# Patient Record
Sex: Male | Born: 1950 | Race: White | Hispanic: No | Marital: Married | Smoking: Never smoker
Health system: Southern US, Community
[De-identification: ages and names within clinical notes are randomized; demographics above are authoritative.]

## PROBLEM LIST (undated history)

## (undated) DIAGNOSIS — K760 Fatty (change of) liver, not elsewhere classified: Secondary | ICD-10-CM

## (undated) DIAGNOSIS — R7989 Other specified abnormal findings of blood chemistry: Secondary | ICD-10-CM

## (undated) DIAGNOSIS — H6120 Impacted cerumen, unspecified ear: Secondary | ICD-10-CM

## (undated) DIAGNOSIS — L309 Dermatitis, unspecified: Secondary | ICD-10-CM

## (undated) DIAGNOSIS — M069 Rheumatoid arthritis, unspecified: Secondary | ICD-10-CM

## (undated) DIAGNOSIS — G4730 Sleep apnea, unspecified: Secondary | ICD-10-CM

## (undated) DIAGNOSIS — R945 Abnormal results of liver function studies: Secondary | ICD-10-CM

## (undated) DIAGNOSIS — I10 Essential (primary) hypertension: Secondary | ICD-10-CM

## (undated) DIAGNOSIS — K219 Gastro-esophageal reflux disease without esophagitis: Secondary | ICD-10-CM

## (undated) DIAGNOSIS — E663 Overweight: Secondary | ICD-10-CM

## (undated) DIAGNOSIS — C439 Malignant melanoma of skin, unspecified: Secondary | ICD-10-CM

## (undated) DIAGNOSIS — K649 Unspecified hemorrhoids: Secondary | ICD-10-CM

## (undated) DIAGNOSIS — K5790 Diverticulosis of intestine, part unspecified, without perforation or abscess without bleeding: Secondary | ICD-10-CM

## (undated) DIAGNOSIS — J302 Other seasonal allergic rhinitis: Secondary | ICD-10-CM

## (undated) DIAGNOSIS — F419 Anxiety disorder, unspecified: Secondary | ICD-10-CM

## (undated) DIAGNOSIS — M1990 Unspecified osteoarthritis, unspecified site: Secondary | ICD-10-CM

## (undated) HISTORY — DX: Sleep apnea, unspecified: G47.30

## (undated) HISTORY — PX: HEMORROIDECTOMY: SUR656

## (undated) HISTORY — DX: Anxiety disorder, unspecified: F41.9

## (undated) HISTORY — DX: Diverticulosis of intestine, part unspecified, without perforation or abscess without bleeding: K57.90

## (undated) HISTORY — DX: Fatty (change of) liver, not elsewhere classified: K76.0

## (undated) HISTORY — DX: Rheumatoid arthritis, unspecified: M06.9

## (undated) HISTORY — DX: Abnormal results of liver function studies: R94.5

## (undated) HISTORY — PX: COLONOSCOPY: SHX174

## (undated) HISTORY — DX: Dermatitis, unspecified: L30.9

## (undated) HISTORY — DX: Other seasonal allergic rhinitis: J30.2

## (undated) HISTORY — DX: Other specified abnormal findings of blood chemistry: R79.89

## (undated) HISTORY — DX: Gastro-esophageal reflux disease without esophagitis: K21.9

## (undated) HISTORY — DX: Unspecified osteoarthritis, unspecified site: M19.90

## (undated) HISTORY — DX: Malignant melanoma of skin, unspecified: C43.9

## (undated) HISTORY — DX: Unspecified hemorrhoids: K64.9

## (undated) HISTORY — DX: Impacted cerumen, unspecified ear: H61.20

## (undated) HISTORY — DX: Overweight: E66.3

---
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
## 2001-08-09 ENCOUNTER — Encounter: Payer: Self-pay | Admitting: Emergency Medicine

## 2001-08-09 ENCOUNTER — Emergency Department (HOSPITAL_COMMUNITY): Admission: EM | Admit: 2001-08-09 | Discharge: 2001-08-09 | Payer: Self-pay | Admitting: Emergency Medicine

## 2004-11-06 ENCOUNTER — Ambulatory Visit: Payer: Self-pay | Admitting: Pulmonary Disease

## 2006-02-20 ENCOUNTER — Ambulatory Visit: Payer: Self-pay | Admitting: Internal Medicine

## 2006-03-04 ENCOUNTER — Ambulatory Visit: Payer: Self-pay | Admitting: Pulmonary Disease

## 2008-03-03 ENCOUNTER — Encounter: Payer: Self-pay | Admitting: Adult Health

## 2008-03-10 ENCOUNTER — Encounter: Payer: Self-pay | Admitting: Pulmonary Disease

## 2009-04-03 ENCOUNTER — Telehealth (INDEPENDENT_AMBULATORY_CARE_PROVIDER_SITE_OTHER): Payer: Self-pay | Admitting: *Deleted

## 2009-04-03 ENCOUNTER — Ambulatory Visit: Payer: Self-pay | Admitting: Pulmonary Disease

## 2009-04-03 DIAGNOSIS — J301 Allergic rhinitis due to pollen: Secondary | ICD-10-CM | POA: Insufficient documentation

## 2009-04-04 DIAGNOSIS — L259 Unspecified contact dermatitis, unspecified cause: Secondary | ICD-10-CM

## 2009-10-11 ENCOUNTER — Encounter: Payer: Self-pay | Admitting: Pulmonary Disease

## 2010-02-07 ENCOUNTER — Encounter: Payer: Self-pay | Admitting: Pulmonary Disease

## 2010-06-07 ENCOUNTER — Encounter: Payer: Self-pay | Admitting: Pulmonary Disease

## 2010-08-07 ENCOUNTER — Ambulatory Visit: Payer: Self-pay | Admitting: Adult Health

## 2010-10-03 ENCOUNTER — Encounter: Payer: Self-pay | Admitting: Pulmonary Disease

## 2010-10-25 ENCOUNTER — Encounter: Payer: Self-pay | Admitting: Pulmonary Disease

## 2010-10-25 ENCOUNTER — Ambulatory Visit: Payer: Self-pay | Admitting: Pulmonary Disease

## 2010-10-26 DIAGNOSIS — H6120 Impacted cerumen, unspecified ear: Secondary | ICD-10-CM

## 2010-10-26 DIAGNOSIS — M069 Rheumatoid arthritis, unspecified: Secondary | ICD-10-CM

## 2010-10-26 DIAGNOSIS — E663 Overweight: Secondary | ICD-10-CM

## 2010-10-26 DIAGNOSIS — F411 Generalized anxiety disorder: Secondary | ICD-10-CM | POA: Insufficient documentation

## 2010-10-27 LAB — CONVERTED CEMR LAB
ALT: 37 units/L (ref 0–53)
AST: 40 units/L — ABNORMAL HIGH (ref 0–37)
Albumin: 4.4 g/dL (ref 3.5–5.2)
Alkaline Phosphatase: 52 units/L (ref 39–117)
BUN: 15 mg/dL (ref 6–23)
Basophils Absolute: 0 10*3/uL (ref 0.0–0.1)
Basophils Relative: 0.4 % (ref 0.0–3.0)
Bilirubin, Direct: 0.1 mg/dL (ref 0.0–0.3)
CO2: 28 meq/L (ref 19–32)
Calcium: 9.1 mg/dL (ref 8.4–10.5)
Chloride: 103 meq/L (ref 96–112)
Cholesterol: 188 mg/dL (ref 0–200)
Creatinine, Ser: 0.9 mg/dL (ref 0.4–1.5)
Eosinophils Absolute: 0.1 10*3/uL (ref 0.0–0.7)
Eosinophils Relative: 1.4 % (ref 0.0–5.0)
GFR calc non Af Amer: 87.4 mL/min (ref >60)
Glucose, Bld: 97 mg/dL (ref 70–99)
HCT: 45.6 % (ref 39.0–52.0)
HDL: 41.8 mg/dL (ref >39.00)
Hemoglobin: 15.9 g/dL (ref 13.0–17.0)
LDL Cholesterol: 117 mg/dL — ABNORMAL HIGH (ref 0–99)
Lymphocytes Relative: 28.2 % (ref 12.0–46.0)
Lymphs Abs: 2.3 10*3/uL (ref 0.7–4.0)
MCHC: 34.8 g/dL (ref 30.0–36.0)
MCV: 90.9 fL (ref 78.0–100.0)
Monocytes Absolute: 0.6 10*3/uL (ref 0.1–1.0)
Monocytes Relative: 8 % (ref 3.0–12.0)
Neutro Abs: 4.9 10*3/uL (ref 1.4–7.7)
Neutrophils Relative %: 62 % (ref 43.0–77.0)
PSA: 0.3 ng/mL (ref 0.10–4.00)
Platelets: 244 10*3/uL (ref 150.0–400.0)
Potassium: 4.3 meq/L (ref 3.5–5.1)
RBC: 5.01 M/uL (ref 4.22–5.81)
RDW: 13.1 % (ref 11.5–14.6)
Sodium: 138 meq/L (ref 135–145)
TSH: 1.46 microintl units/mL (ref 0.35–5.50)
Total Bilirubin: 0.8 mg/dL (ref 0.3–1.2)
Total CHOL/HDL Ratio: 4
Total Protein: 7.6 g/dL (ref 6.0–8.3)
Triglycerides: 144 mg/dL (ref 0.0–149.0)
VLDL: 28.8 mg/dL (ref 0.0–40.0)
WBC: 8 10*3/uL (ref 4.5–10.5)

## 2010-11-16 ENCOUNTER — Telehealth: Payer: Self-pay | Admitting: Pulmonary Disease

## 2010-12-25 NOTE — Letter (Signed)
Summary: [facility]  [facility]   Imported By: Lester Century 11/28/2009 10:26:27  _____________________________________________________________________  External Attachment:    Type:   Image     Comment:   External Document
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
## 2010-12-25 NOTE — Letter (Signed)
Summary: Office Visit [facility]  Office Visit [facility]   Imported By: Lennie Odor 06/26/2010 13:36:19  _____________________________________________________________________  External Attachment:    Type:   Image     Comment:   External Document

## 2010-12-25 NOTE — Letter (Signed)
Summary: [facility]   Imported By: Sherian Rein 02/28/2010 07:54:25  _____________________________________________________________________  External Attachment:    Type:   Image     Comment:   External Document

## 2010-12-25 NOTE — Letter (Signed)
Summary: [facility]   Imported By: Sherian Rein 10/16/2010 10:01:31  _____________________________________________________________________  External Attachment:    Type:   Image     Comment:   External Document

## 2010-12-25 NOTE — Assessment & Plan Note (Signed)
Summary: inbeded ear wax, pain/in [facility] office/apc   Visit Type:  Acute NP visit Primary Provider/Referring Provider:  Alroy Dust, MD  CC:  Pt c/o bilateral ear itching with pain x 1 week reduced hearing in left ear. Pt requesting flu vaccine.  History of Present Illness: 60 year old male with known history of Rheumatoid Arthritis and rhinitis.   Apr 04, 2009--Last seen 4/07. Pt is followed at rheumatolgy for RZ on humira and methotrexate. He reports he does well and comes in as needed. Chart reveiwed, has not been seen for physical, last seen by Dr. Kriste Basque in 2004 for sick visit.  Presents today for rash on right shoulder that is pruritic. Was working out in yard, mowing. wearing t-shirt w/ limb/brush up against him. Now red area w/ some linear streaking down chest and axillla. Mild redness, no drainage or fever. Denies chest pain, dyspnea, orthopnea, hemoptysis, fever, n/v/d, edema, headache.   August 07, 2010--Presents for work in visit. Complains of Pt c/o bilateral ear itching with pain x 1 week reduced hearing in left ear. Pt requesting flu vaccine. Ears feels very stopped up. Decresed hearing overall. no known injury. Denies chest pain, dyspnea, orthopnea, hemoptysis, fever, n/v/d, edema, headache,ear drainage. We discussed that he is overdue for physical. He has labs at rheumatolgy but has agreed to come back for physical  w/ Dr. Kriste Basque. 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  Pt is not fasitng today.       Preventive Screening-Counseling & Management  Alcohol-Tobacco     Smoking Status: never  Medications Prior to Update: 1)  Allegra 180 Mg  Tabs (Fexofenadine Hcl) .Marland Kitchen.. 1 By Mouth Once Daily As Needed Allergies 2)  Rheumatrex 2.5 Mg Tabs (Methotrexate (Anti-Rheumatic)) .... 8 Tablets By Mouth Every 7 Days 3)  [name] 20 Mg/0.62ml Kit (Adalimumab) .... Every 2 Weeks 4)  Folic Acid 400 Mcg Tabs (Folic Acid) .... Take 1 Tablet By Mouth Once A  Day 5)  Prednisone 10 Mg Tabs (Prednisone) .... 4 Tabs For 2 Days, Then 3 Tabs For 2 Days, 2 Tabs For 2 Days, Then 1 Tab For 2 Days, Then Stop  Current Medications (verified): 1)  Allegra-D Allergy & Congestion 180-240 Mg Xr24h-Tab (Fexofenadine-Pseudoephedrine) .... Take 1 Tablet By Mouth Once A Day As Needed 2)  Rheumatrex 2.5 Mg Tabs (Methotrexate (Anti-Rheumatic)) .... 4 Tablets By Mouth Once Weekly 3)  [name] 20 Mg/0.34ml Kit (Adalimumab) .... Every 2 Weeks 4)  Folic Acid 400 Mcg Tabs (Folic Acid) .... Take 1 Tablet By Mouth Once A Day 5)  Vitamin D3 1000 Unit Caps (Cholecalciferol) .... Take 1 Tablet By Mouth Once A Day  Allergies (verified): No Known Drug Allergies  Past History:  Family History: Last updated: 04/03/2009 positive for aortic aneurysm  Social History: Last updated: 04/03/2009 Patient is the son of long-time patient of Dr. 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 ON [name] and MTX.     Review of Systems      See HPI  Vital Signs:  Patient profile:   60 year old male Height:      58 inches Weight:      237 pounds BMI:     49.71 O2 Sat:      97 % on Room air Temp:  98.0 degrees F oral Pulse rate:   83 / minute BP sitting:   130 / 78  (left arm) Cuff size:   large  Vitals Entered By: Zackery Barefoot CMA (August 07, 2010 2:57 PM)  O2 Flow:  Room air CC: Pt c/o bilateral ear itching with pain x 1 week reduced hearing in left ear. Pt requesting flu vaccine Comments Medications reviewed with patient Verified contact number and pharmacy with patient Zackery Barefoot CMA  August 07, 2010 3:03 PM    Physical Exam  Additional Exam:  GEN: A/Ox3; pleasant , NAD HEENT:  [place]/AT, , EACs-bilateral cerumen impaction. , TMs-wnl, NOSE-clear, THROAT-clear NECK:  Supple w/  fair ROM; no JVD; normal carotid impulses w/o bruits; no thyromegaly or nodules palpated; no lymphadenopathy. RESP  Clear to P & A; CARD:  RRR, no m/r/g   GI:   Soft & nt; nml bowel sounds; no organomegaly or masses detected. Musco: Warm bil,  no calf tenderness edema, clubbing, pulses intact Neuro:  a/ox3 intact no focal deficits.     Procedure Bilateral ear wash w/ cerumen extraction.    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  Debrox as needed  Please contact office for sooner follow up if symptoms do not improve or worsen   Orders: Est. Patient Level III (16109)  Problem # 2:  ARTHRITIS, RHEUMATOID (ICD-714.0) cont to follow up .Rheumatology.  will need to return for CPX and fasting labs.  Orders: Est. Patient Level III (60454)  Problem # 3:  MORBID OBESITY (ICD-278.01) Healthy lifestyle discussed.  follow up CPX  Orders: Est. Patient Level III (09811)  Medications Added to Medication List This Visit: 1)  Allegra-d Allergy & Congestion 180-240 Mg Xr24h-tab (Fexofenadine-pseudoephedrine) .... Take 1 tablet by mouth once a day as needed 2)  Rheumatrex 2.5 Mg Tabs (Methotrexate (anti-rheumatic)) .... 4 tablets by mouth once weekly 3)  Vitamin D3 1000 Unit Caps (Cholecalciferol) .... Take 1 tablet by mouth once a day  Complete Medication List: 1)  Allegra-d Allergy & Congestion 180-240 Mg Xr24h-tab (Fexofenadine-pseudoephedrine) .... Take 1 tablet by mouth once a day as needed 2)  Rheumatrex 2.5 Mg Tabs (Methotrexate (anti-rheumatic)) .... 4 tablets by mouth once weekly 3)  [name] 20 Mg/0.40ml Kit (Adalimumab) .... Every 2 weeks 4)  Folic Acid 400 Mcg Tabs (Folic acid) .... Take 1 tablet by mouth once a day 5)  Vitamin D3 1000 Unit Caps (Cholecalciferol) .... Take 1 tablet by mouth once a day  Other Orders: Flu Vaccine 8yrs + (91478) Admin 1st Vaccine (29562)  Patient  Instructions: 1)  May use debrox drops or peroxide prior to shower as needed for ear wax  2)  Very important to follow up with Dr. Kriste Basque for physical w/ fasitng lab 3)  Flu shot today.    Immunizations Administered:  Influenza Vaccine # 1:    Vaccine Type: Fluvax 3+    Site: left deltoid    Mfr: GlaxoSmithKline    Dose: 0.5 ml    Route: IM    Given by: Zackery Barefoot CMA    Exp. Date: 05/25/2011    Lot #: [id number]  Flu Vaccine Consent Questions:    Do you have a history of severe allergic reactions to this vaccine? no    Any prior history of allergic reactions to egg and/or gelatin? no    Do you have a sensitivity to the preservative Thimersol? no    Do  you have a past history of Guillan-Barre Syndrome? no    Do you currently have an acute febrile illness? no    Have you ever had a severe reaction to latex? no    Vaccine information given and explained to patient? yes

## 2010-12-27 NOTE — Progress Notes (Signed)
Summary: sick  Phone Note Call from Patient Call back at [phone]   Caller: Patient Call For: [name] Summary of Call: Pt c/o headache, head congestion, aches in arms, fatigue, watery and itchy eyes, sneezing, runny nose x 2 days. States has been in [name] with mother and may have "picked up" something from there. Pt wanted to inform SN of same due to weak immune system. Pt in with mother in [place] [address] [facility] or can call him at above cell #. [phone]. Initial call taken by: Zackery Barefoot CMA,  November 16, 2010 9:45 AM  Follow-up for Phone Call        per SN---zpak #1  take as directed rest and increase fluids use mucinex for congestion as directed and saline nasal spray.  called and spoke with pt and he is aware---stated that his mom might be d/c from the [name] tomorrow to her home. Randell Loop CMA  November 16, 2010 2:16 PM     New/Updated Medications: ZITHROMAX Z-PAK 250 MG TABS (AZITHROMYCIN) take as directed Prescriptions: ZITHROMAX Z-PAK 250 MG TABS (AZITHROMYCIN) take as directed  #1 x 0   Entered by:   Randell Loop CMA   Authorized by:   Michele Mcalpine MD   Signed by:   Randell Loop CMA on 11/16/2010   Method used:   Electronically to        [facility] [address] (retail)       [address]       [place], [place]  [address]       Ph: [phone] or [phone]       Fax: [phone]   RxID:   [id number]

## 2011-04-04 ENCOUNTER — Telehealth: Payer: Self-pay | Admitting: Pulmonary Disease

## 2011-04-04 NOTE — Telephone Encounter (Signed)
Pt states his liver enzymes were elevated and he wants to know if taking xanax prn could contribute to this. Pls advise.

## 2011-04-04 NOTE — Telephone Encounter (Signed)
Per SN---this is not a side effect of xanax--this med does not affect your liver.--but the methotrexate can affect liver---will need to let Dr. Herma Carson know right away of results of labs.

## 2011-04-04 NOTE — Telephone Encounter (Signed)
Spoke with pt and notified of the above recs per SN.  Pt verbalized understanding and denied any questions.

## 2011-06-11 ENCOUNTER — Other Ambulatory Visit: Payer: Self-pay | Admitting: Pulmonary Disease

## 2011-10-22 ENCOUNTER — Ambulatory Visit (INDEPENDENT_AMBULATORY_CARE_PROVIDER_SITE_OTHER): Payer: BC Managed Care – PPO | Admitting: Adult Health

## 2011-10-22 ENCOUNTER — Encounter: Payer: Self-pay | Admitting: Adult Health

## 2011-10-22 ENCOUNTER — Encounter: Payer: Self-pay | Admitting: Pulmonary Disease

## 2011-10-22 DIAGNOSIS — J069 Acute upper respiratory infection, unspecified: Secondary | ICD-10-CM

## 2011-10-22 MED ORDER — AZITHROMYCIN 250 MG PO TABS: ORAL_TABLET | ORAL | Status: AC

## 2011-10-22 NOTE — Patient Instructions (Signed)
Zpack to have on hold if symptoms worsen with discolored mucus  Mucinex DM Twice daily  As needed  Cough /congestion  Saline  Nasal rinses As needed   Tylenol As needed   Please contact office for sooner follow up if symptoms do not improve or worsen or seek emergency care  follow up Dr. Kriste Basque  In 2 months for physical.

## 2011-10-22 NOTE — Progress Notes (Signed)
  Subjective:    Patient ID: Nathaniel [name], male    DOB: 1951/04/28, 60 y.o.   MRN: [id number]  HPI 60 y/o WF, son of Nathaniel [name], here for a yearly follow up & CPX... 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 prev LMD was Nathaniel [name], & he has a hx of RA on MTX & [name]- followed by Nathaniel [name] every 51mo...   ~ October 25, 2010: overall doing well- concern over side effects of [name] but RA is controlled (Nathaniel [name])... no resp problems x some snoring, but no evid sleep disordered breathing or daytime hypersom- rest well, wakes refreshed, he is overwt & had difficulty w/ wt reduction... no CP, palpit, SOB, edema, etc... denies GI symptoms but he's never had a colonoscopy & we will refer to GI for this... PSA last yr from [facility] screening was OK & PSA today= 0.30...   10/22/2011 Acute OV  Pt complains of  chills, productive cough with green mucus, sinus pressure, crackles in ears x 3 days.  Taking otc cold meds without much help.  Weather changes are making him worse. Started with chills and decreased appetite. Had flu shot 2 months ago.  No chest pain , hemoptysis or edema.     Review of Systems Constitutional:   No  weight loss, night sweats,  Fevers, ++ chills, fatigue, or  lassitude.  HEENT:   No headaches,  Difficulty swallowing,  Tooth/dental problems, or  Sore throat,                No sneezing, itching, ++ ear ache, nasal congestion, post nasal drip,   CV:  No chest pain,  Orthopnea, PND, swelling in lower extremities, anasarca, dizziness, palpitations, syncope.   GI  No heartburn, indigestion, abdominal pain, nausea, vomiting, diarrhea, change in bowel habits, loss of appetite, bloody stools.   Resp: No shortness of breath with exertion or at rest.    No coughing up of blood.    No wheezing.  No chest wall deformity  Skin: no rash or lesions.  GU: no dysuria, change in color of urine, no urgency or frequency.  No flank pain, no hematuria   MS:  No joint pain or swelling.   No decreased range of motion.  No back pain.  Psych:  No change in mood or affect. No depression or anxiety.  No memory loss.         Objective:   Physical Exam  GEN: A/Ox3; pleasant , NAD, obese   HEENT:  [place]/AT,  EACs-clear, TMs-wnl, NOSE-clear drainage , THROAT-clear, no lesions, no postnasal drip or exudate noted.   NECK:  Supple w/ fair ROM; no JVD; normal carotid impulses w/o bruits; no thyromegaly or nodules palpated; no lymphadenopathy.  RESP  Clear  P & A; w/o, wheezes/ rales/ or rhonchi.no accessory muscle use, no dullness to percussion  CARD:  RRR, no m/r/g  , no peripheral edema, pulses intact, no cyanosis or clubbing.  GI:   Soft & nt; nml bowel sounds; no organomegaly or masses detected.  Musco: Warm bil, no deformities or joint swelling noted.   Neuro: alert, no focal deficits noted.    Skin: Warm, no lesions or rashes        Assessment & Plan:

## 2011-10-24 ENCOUNTER — Telehealth: Payer: Self-pay | Admitting: Pulmonary Disease

## 2011-10-24 DIAGNOSIS — J069 Acute upper respiratory infection, unspecified: Secondary | ICD-10-CM | POA: Insufficient documentation

## 2011-10-24 NOTE — Assessment & Plan Note (Signed)
Zpack to have on hold if symptoms worsen with discolored mucus  Mucinex DM Twice daily  As needed  Cough /congestion  Saline  Nasal rinses As needed   Tylenol As needed   Please contact office for sooner follow up if symptoms do not improve or worsen or seek emergency care  follow up Dr. Nadel  In 2 months for physical.   

## 2011-10-25 NOTE — Telephone Encounter (Signed)
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  LMOM TCB x1 for pt - just need dates to put on note.  Told pt to ask for me by name.

## 2011-10-30 NOTE — Telephone Encounter (Signed)
Spoke with the patient and he says that this was for last week but everything has been worked out on the job and he no longer needs the work note. Katheren Shams aware.

## 2012-08-20 ENCOUNTER — Encounter: Payer: Self-pay | Admitting: Adult Health

## 2012-08-20 ENCOUNTER — Ambulatory Visit (INDEPENDENT_AMBULATORY_CARE_PROVIDER_SITE_OTHER): Payer: BC Managed Care – PPO | Admitting: Adult Health

## 2012-08-20 VITALS — BP 150/86 | HR 75 | Temp 96.9°F | Ht 66.0 in | Wt 239.8 lb

## 2012-08-20 DIAGNOSIS — G4700 Insomnia, unspecified: Secondary | ICD-10-CM

## 2012-08-20 DIAGNOSIS — H6120 Impacted cerumen, unspecified ear: Secondary | ICD-10-CM

## 2012-08-20 MED ORDER — ZOLPIDEM TARTRATE 5 MG PO TABS: 5.0000 mg | ORAL_TABLET | Freq: Every evening | ORAL | Status: DC | PRN

## 2012-08-20 NOTE — Patient Instructions (Addendum)
Return for physical with Dr. Kriste Basque  In 4 weeks with fasting labs -,make sure you keep this  Low salt diet  Healthy sleep habits  May try Ambien 5mg  1/2-1 At bedtime  As needed  Insomnia  Debrox As needed  Ear wax.

## 2012-08-21 DIAGNOSIS — G4700 Insomnia, unspecified: Secondary | ICD-10-CM | POA: Insufficient documentation

## 2012-08-21 NOTE — Assessment & Plan Note (Signed)
Ear irrigation without difficulty.  Debrox As needed

## 2012-08-21 NOTE — Progress Notes (Signed)
  Subjective:    Patient ID: Nathaniel [name], male    DOB: [birth year], 61 y.o.   MRN: [id number]  HPI  78  y/o WF, son of Nathaniel [name], here for a yearly follow up & CPX... 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 prev LMD was Nathaniel [name], & he has a hx of RA on MTX & [name]- followed by Nathaniel [name] every 47mo...    08/20/2012 Acute OV  Complains of  build up of wax in ear. Decreased hearing.  Complains that he has trouble sleeping. Worries a lot.  Wants something to get good night sleep.  We discussed he need to return for CPX w/ fasting labs.    Review of Systems  Constitutional:   No  weight loss, night sweats,  Fevers,chills, fatigue, or  lassitude.  HEENT:   No headaches,  Difficulty swallowing,  Tooth/dental problems, or  Sore throat,                No sneezing, itching,ear ache, nasal congestion, post nasal drip,   CV:  No chest pain,  Orthopnea, PND, swelling in lower extremities, anasarca, dizziness, palpitations, syncope.   GI  No heartburn, indigestion, abdominal pain, nausea, vomiting, diarrhea, change in bowel habits, loss of appetite, bloody stools.   Resp: No shortness of breath with exertion or at rest.    No coughing up of blood.    No wheezing.  No chest wall deformity  Skin: no rash or lesions.  GU: no dysuria, change in color of urine, no urgency or frequency.  No flank pain, no hematuria   MS:  No joint pain or swelling.  No decreased range of motion.  No back pain.  Psych:  No change in mood or affect.   No memory loss.         Objective:   Physical Exam   GEN: A/Ox3; pleasant , NAD, obese   HEENT:  [place]/AT,  EACs-bilateral cerumen impaction , TMs-wnl, NOSE-clear drainage , THROAT-clear, no lesions, no postnasal drip or exudate noted.   NECK:  Supple w/ fair ROM; no JVD; normal carotid impulses w/o bruits; no thyromegaly or nodules palpated; no lymphadenopathy.  RESP  Clear  P & A; w/o, wheezes/ rales/ or rhonchi.no accessory muscle use,  no dullness to percussion  CARD:  RRR, no m/r/g  , no peripheral edema, pulses intact, no cyanosis or clubbing.  GI:   Soft & nt; nml bowel sounds; no organomegaly or masses detected.  Musco: Warm bil, no deformities or joint swelling noted.   Neuro: alert, no focal deficits noted.    Skin: Warm, no lesions or rashes        Assessment & Plan:

## 2012-08-21 NOTE — Assessment & Plan Note (Signed)
Healthy Sleep hygiene discussed  May try Remus Loffler As needed

## 2012-10-16 ENCOUNTER — Ambulatory Visit: Payer: BC Managed Care – PPO | Admitting: Pulmonary Disease

## 2012-10-19 ENCOUNTER — Other Ambulatory Visit: Payer: Self-pay | Admitting: Internal Medicine

## 2012-10-30 ENCOUNTER — Other Ambulatory Visit: Payer: BC Managed Care – PPO

## 2012-12-04 ENCOUNTER — Other Ambulatory Visit: Payer: BC Managed Care – PPO

## 2012-12-18 ENCOUNTER — Ambulatory Visit: Payer: BC Managed Care – PPO | Admitting: Pulmonary Disease

## 2013-01-21 ENCOUNTER — Other Ambulatory Visit (INDEPENDENT_AMBULATORY_CARE_PROVIDER_SITE_OTHER): Payer: BC Managed Care – PPO

## 2013-01-21 ENCOUNTER — Encounter: Payer: Self-pay | Admitting: Pulmonary Disease

## 2013-01-21 ENCOUNTER — Ambulatory Visit (INDEPENDENT_AMBULATORY_CARE_PROVIDER_SITE_OTHER): Payer: BC Managed Care – PPO | Admitting: Pulmonary Disease

## 2013-01-21 VITALS — BP 142/84 | HR 72 | Temp 97.2°F | Ht 66.0 in | Wt 237.6 lb

## 2013-01-21 DIAGNOSIS — Z0000 Encounter for general adult medical examination without abnormal findings: Secondary | ICD-10-CM

## 2013-01-21 DIAGNOSIS — M069 Rheumatoid arthritis, unspecified: Secondary | ICD-10-CM

## 2013-01-21 DIAGNOSIS — E663 Overweight: Secondary | ICD-10-CM

## 2013-01-21 DIAGNOSIS — G4700 Insomnia, unspecified: Secondary | ICD-10-CM

## 2013-01-21 DIAGNOSIS — R945 Abnormal results of liver function studies: Secondary | ICD-10-CM

## 2013-01-21 DIAGNOSIS — F411 Generalized anxiety disorder: Secondary | ICD-10-CM

## 2013-01-21 DIAGNOSIS — R7989 Other specified abnormal findings of blood chemistry: Secondary | ICD-10-CM

## 2013-01-21 LAB — CBC WITH DIFFERENTIAL/PLATELET
Eosinophils Absolute: 0.1 10*3/uL (ref 0.0–0.7)
HCT: 46.2 % (ref 39.0–52.0)
Lymphs Abs: 2.2 10*3/uL (ref 0.7–4.0)
MCHC: 34.9 g/dL (ref 30.0–36.0)
MCV: 85.8 fl (ref 78.0–100.0)
Monocytes Absolute: 0.9 10*3/uL (ref 0.1–1.0)
Neutrophils Relative %: 64.2 % (ref 43.0–77.0)
Platelets: 233 10*3/uL (ref 150.0–400.0)
RDW: 12.8 % (ref 11.5–14.6)

## 2013-01-21 LAB — BASIC METABOLIC PANEL
BUN: 17 mg/dL (ref 6–23)
Chloride: 103 mEq/L (ref 96–112)
Potassium: 4.3 mEq/L (ref 3.5–5.1)
Sodium: 138 mEq/L (ref 135–145)

## 2013-01-21 LAB — LIPID PANEL
Cholesterol: 176 mg/dL (ref 0–200)
LDL Cholesterol: 106 mg/dL — ABNORMAL HIGH (ref 0–99)
Total CHOL/HDL Ratio: 4

## 2013-01-21 LAB — TSH: TSH: 1.67 u[IU]/mL (ref 0.35–5.50)

## 2013-01-21 LAB — HEPATIC FUNCTION PANEL
ALT: 45 U/L (ref 0–53)
AST: 38 U/L — ABNORMAL HIGH (ref 0–37)
Alkaline Phosphatase: 54 U/L (ref 39–117)
Bilirubin, Direct: 0.2 mg/dL (ref 0.0–0.3)
Total Bilirubin: 1.2 mg/dL (ref 0.3–1.2)

## 2013-01-21 LAB — PSA: PSA: 0.48 ng/mL (ref 0.10–4.00)

## 2013-01-21 MED ORDER — CLORAZEPATE DIPOTASSIUM 7.5 MG PO TABS: ORAL_TABLET | ORAL | Status: DC

## 2013-01-21 NOTE — Patient Instructions (Addendum)
Today we updated your med list in our EPIC system...    Continue your current medications the same...  We wrote a new medication for your nerves and sleep>    Try the Chlorazepate 7.5mg  1/2 to 1 tab up to twice daily as needed...  Today we did your FASTING blood work...    We will contact you w/ the results when avail...  We found out that NO ONE does in house abd ultrasound tests...    We will set up an out pt sonar & try to ascertain your coverage/ cost...  Let me know if we need to sched a sleep study for you...    In the meanwhile the biggest factor is your weight!!!       Count calories, eat less, exercise, burn more- get the weight down...  Please let us know if we can be of service in any way.Marland KitchenMarland Kitchen

## 2013-01-22 ENCOUNTER — Telehealth: Payer: Self-pay | Admitting: Pulmonary Disease

## 2013-01-22 MED ORDER — OMEPRAZOLE 20 MG PO CPDR: 20.0000 mg | DELAYED_RELEASE_CAPSULE | Freq: Every day | ORAL | Status: DC

## 2013-01-22 NOTE — Telephone Encounter (Signed)
Refill of the  prilosec has been sent in to the pts pharmacy per pts request. Nothing further is needed.

## 2013-01-23 ENCOUNTER — Encounter: Payer: Self-pay | Admitting: Pulmonary Disease

## 2013-01-23 NOTE — Progress Notes (Signed)
Subjective:     Patient ID: Nathaniel [name], male   DOB: 05-Dec-1950, 62 y.o.   MRN: [id number]  HPI 62 y/o WF, son of Wendel Homeyer, here for a follow up visit & CPX... 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 prev LMD was DrKalish, & he has a hx of RA on MTX & [name]- followed by Eugenie Norrie every 50mo...  ~  October 25, 2010:  overall doing well- concern over side effects of [name] but RA is controlled (DrZ)... no resp problems x some snoring, but no evid sleep disordered breathing or daytime hypersom- rest well, wakes refreshed, he is overwt & had difficulty w/ wt reduction... no CP, palpit, SOB, edema, etc... denies GI symptoms but he's never had a colonoscopy & we will refer to GI for this... PSA last yr from [facility] screening was OK & PSA today= 0.30...  ~  January 21, 2013:  47month ROV & CPX> Jovi continues his regular f/u w/ DrBeekman for Rheum- on [name] & MTX; we do not have any recent notes from Rheum & we have called for these but so far we have not received any records; He indicates that he gets lab work every 50mo as well & he is concerned about a report of elev LFTs and DrBeekman requested an Abd Ultrasound- pending (pt is concerned about his insurance coverage)...  We reviewed the following medical problems during today's office visit >>     AR> he uses OTC antihis as needed...    Snoring> Adal mentioned snoring as a problem (denies daytime hypersomnolence) & we discussed the work-up & offered sleep study when he is ready (he doesn't want it now because he thinks the CPAP will scare his cat)...    Overweight> his weight remains similar to several yrs ago- weight = 238# & we reviewed diet, exercise, wt reduction strategies...    GERD> he has not had GI eval but notes occas epis of heartburn related to food intake but no abd pain, dysphagia, n/v, etc...    Need for colonoscopy> he has never had a screening colonoscopy & is advised to set up this important screening  procedure...    Elev LFTs> he is concerned over recent report of elev LFTs from [facility]; they have ordered an AbdUltrasound but pt is reluctant due to cost; repeat labs here were WNL x SGOT=38 (nl 0-37) and he is reassured; he denies abd pain, n/v, c/d, blood seen; ultrasounds are no longer done "in house" by our GI division & we will sched it to be done out pt...    RA> he remains under the care of DrBeekman for Rheum- on [name] & MTX, +Folate & VitD, we have requested recent notes to review...    Anxiety> he was prev on Alpraz0.5 but he states it gave him HAs; requests diff anxiolytic Rx & we chose Chlorazepate 7.5mg  prn...    Skin lesions> he has mult SKs & is refewred to Derm to consider excision as necessary... We reviewed prob list, meds, xrays and labs> see below for updates >>  LABS 2/14:  FLP- at goals on diet alone x LDL=106;  Chem- wnl x SGOT=38;  CBC- wnl;  TSH=1.67;  PSA=0.48 Abd Sonar => pending         Problem List:   ALLERGIC RHINITIS, SEASONAL (ICD-477.0) - he uses OTC antihist Prn...   SNORING >> Gottfried mentioned snoring as a problem 2/14 (denies daytime hypersomnolence) & we discussed the work-up &  offered sleep study when he is ready (he doesn't want it now because he thinks the CPAP will scare his cat)...  OVERWEIGHT (ICD-278.02) - we discussed diet + exercise program suffic to lose weight. ~  Weight 11/11 = 231#,  5\' 8"  tall,  BMI= 35 ~  Weight 2/14 = 238#  GERD >> he has not had GI eval but notes occas epis of heartburn related to food intake but no abd pain, dysphagia, n/v, etc...  Need for colonoscopy >> he has never had a screening colonoscopy & is advised to set up this important screening procedure...  Elev LFTs >> he is concerned over recent report of elev LFTs from [facility]; they have ordered an AbdUltrasound but pt is reluctant due to cost; repeat labs here were WNL x SGOT=38 (nl 0-37) and he is reassured; he denies abd pain, n/v, c/d, blood seen; ultrasounds  are no longer done "in house" by our GI division & we will sched it to be done out pt...  RHEUMATOID ARTHRITIS (ICD-714.0) - followed by DrZieminski every 66mo on METHOTREXATE 2.5mg - taking 3tabs Qwk, and [name] every other week... he is doing extremely well- asymptomatic & no stigmata of RA.. ~  2/14: he remains under the care of DrBeekman for Rheum- on [name] & MTX, +Folate & VitD, we have requested recent notes to review...  ANXIETY (ICD-300.00) - he has some mild anxiety related to his work & RA diagnosis & Rx... we discussed trial ALPRAZOLAM 0.5mg - 1/2 to 1 tab Tid Prn... ~  2/14:  he was prev on Alpraz0.5 but he states it gave him HAs; requests diff anxiolytic Rx & we chose Chlorazepate 7.5mg  prn...  Health Maintenance: ~  GI:  no signif GI hx & he has never had a colonoscopy> we will refer to GI for this important screening procedure... ~  GU:  he had DRE (neg) and PSA (0.23) in 2010 at [facility]... 2011 CPX here w/ neg DRE & PSA=0.30...  ~  Immunizations:  he will check w/ DrZ since he is on the immunosuppressives=> he had Pneumovax 9/10, TDAP 10/13, and 2013 Flu vaccine 9/13...   Past Surgical History  Procedure Laterality Date  . Hemorroidectomy      Dr. Orson Slick    Outpatient Encounter Prescriptions as of 01/21/2013  Medication Sig Dispense Refill  . adalimumab ([name] PEN) 40 MG/0.8ML injection Inject 40 mg into the skin every other day.       . cholecalciferol (VITAMIN D) 1000 UNITS tablet Take 1,000 Units by mouth daily.        . folic acid (FOLVITE) 1 MG tablet Take 1 mg by mouth daily.        . methotrexate (RHEUMATREX) 2.5 MG tablet 2  tabs by mouth once weekly      . clorazepate (TRANXENE) 7.5 MG tablet Take 1/2 to 1 tablet by mouth up to twice daily as needed  60 tablet  5  . [DISCONTINUED] ALPRAZolam (XANAX) 0.5 MG tablet TAKE 1/2 TO 1 TABLET BY MOUTH UP TO TWICE DAILY AS NEEDED FOR ANXIETY  90 tablet  5  . [DISCONTINUED] cetirizine (ZYRTEC) 10 MG tablet Take 10 mg by  mouth daily as needed.        . [DISCONTINUED] fexofenadine-pseudoephedrine (ALLEGRA-D 24) 180-240 MG per 24 hr tablet Take 1 tablet by mouth daily as needed.        . [DISCONTINUED] sodium chloride (OCEAN) 0.65 % nasal spray Place 1 spray into the nose as needed.        . [  DISCONTINUED] zolpidem (AMBIEN) 5 MG tablet Take 1 tablet (5 mg total) by mouth at bedtime as needed for sleep.  30 tablet  0   No facility-administered encounter medications on file as of 01/21/2013.    No Known Allergies   Current Medications, Allergies, Past Medical History, Past Surgical History, Family History, and Social History were reviewed in [employer] record.   Review of Systems         The patient complains of dyspnea on exertion, arthritis, anxiety, and hay fever.  The patient denies fever, chills, sweats, anorexia, fatigue, weakness, malaise, weight loss, sleep disorder, blurring, diplopia, eye irritation, eye discharge, vision loss, eye pain, photophobia, earache, ear discharge, tinnitus, decreased hearing, nasal congestion, nosebleeds, sore throat, hoarseness, chest pain, palpitations, syncope, orthopnea, PND, peripheral edema, cough, dyspnea at rest, excessive sputum, hemoptysis, wheezing, pleurisy, nausea, vomiting, diarrhea, constipation, change in bowel habits, abdominal pain, melena, hematochezia, jaundice, gas/bloating, indigestion/heartburn, dysphagia, odynophagia, dysuria, hematuria, urinary frequency, urinary hesitancy, nocturia, incontinence, back pain, joint pain, joint swelling, muscle cramps, muscle weakness, stiffness, sciatica, restless legs, leg pain at night, leg pain with exertion, rash, itching, dryness, suspicious lesions, paralysis, paresthesias, seizures, tremors, vertigo, transient blindness, frequent falls, frequent headaches, difficulty walking, depression, memory loss, confusion, cold intolerance, heat intolerance, polydipsia, polyphagia, polyuria, unusual weight  change, abnormal bruising, bleeding, enlarged lymph nodes, urticaria, allergic rash, and recurrent infections.     Objective:   Physical Exam      WD, overweight, 62 y/o WM in NAD... GENERAL:  Alert & oriented; pleasant & cooperative... HEENT:  [place]/AT, EOM-wnl, PERRLA, Fundi-benign, EACs-clear, TMs-wnl, NOSE-clear, THROAT-clear & wnl. NECK:  Supple w/ full ROM; no JVD; normal carotid impulses w/o bruits; no thyromegaly or nodules palpated; no lymphadenopathy. CHEST:  Clear to P & A; without wheezes/ rales/ or rhonchi. HEART:  Regular Rhythm; without murmurs/ rubs/ or gallops. ABDOMEN:  Soft & nontender; normal bowel sounds; no organomegaly or masses detected. RECTAL:  Neg - prostate 2+ & nontender w/o nodules; stool hematest neg. EXT: without deformities or arthritic changes; no active synovitis; no varicose veins/ venous insuffic/ or edema. NEURO:  CN's intact; motor testing normal; sensory testing normal; gait normal & balance OK. DERM:  Mult SKs noted...  RADIOLOGY DATA:  Reviewed in the EPIC EMR & discussed w/ the patient...  LABORATORY DATA:  Reviewed in the EPIC EMR & discussed w/ the patient...   Assessment:     AR/ Snoring>  Offered sleep study but he declines at this time & will let me know when he is ready to proceed...  Overweight>  We reviewed diet, exercise, wt reduction strategies...  GI- GERD, needs colonoscopy, elev SGOT>  We reviewed Prilosec vs Pepcid OTC for reflux symptoms; he needs to sched screening colon; he is reassured about the min elev of SGOTR but he is concerned & we will order Sonar w/ copy to drBeekman...  RA>  He remains on [name] & low dose M[facility]...  Anxiety>  We will try Chlorazepate 7.5mg  1/2 to 1 tab po Tid prn...  Skin Lesions>  Mult SKs could be removed by Derm at his request, he will call Dr?Hall...     Plan:     Patient's Medications  New Prescriptions   CLORAZEPATE (TRANXENE) 7.5 MG TABLET    Take 1/2 to 1 tablet by mouth  up to twice daily as needed   OMEPRAZOLE (PRILOSEC) 20 MG CAPSULE    Take 1 capsule (20 mg total) by mouth daily.  Previous  Medications   ADALIMUMAB ([name] PEN) 40 MG/0.8ML INJECTION    Inject 40 mg into the skin every other day.    CHOLECALCIFEROL (VITAMIN D) 1000 UNITS TABLET    Take 1,000 Units by mouth daily.     FOLIC ACID (FOLVITE) 1 MG TABLET    Take 1 mg by mouth daily.     METHOTREXATE (RHEUMATREX) 2.5 MG TABLET    2  tabs by mouth once weekly  Modified Medications   No medications on file  Discontinued Medications   ALPRAZOLAM (XANAX) 0.5 MG TABLET    TAKE 1/2 TO 1 TABLET BY MOUTH UP TO TWICE DAILY AS NEEDED FOR ANXIETY   CETIRIZINE (ZYRTEC) 10 MG TABLET    Take 10 mg by mouth daily as needed.     FEXOFENADINE-PSEUDOEPHEDRINE (ALLEGRA-D 24) 180-240 MG PER 24 HR TABLET    Take 1 tablet by mouth daily as needed.     SODIUM CHLORIDE (OCEAN) 0.65 % NASAL SPRAY    Place 1 spray into the nose as needed.     ZOLPIDEM (AMBIEN) 5 MG TABLET    Take 1 tablet (5 mg total) by mouth at bedtime as needed for sleep.

## 2013-01-25 ENCOUNTER — Ambulatory Visit (HOSPITAL_COMMUNITY)
Admission: RE | Admit: 2013-01-25 | Discharge: 2013-01-25 | Disposition: A | Discharge status: Home / Self Care | Payer: BC Managed Care – PPO | Source: Ambulatory Visit | Attending: Pulmonary Disease | Admitting: Pulmonary Disease

## 2013-01-25 DIAGNOSIS — K7689 Other specified diseases of liver: Secondary | ICD-10-CM | POA: Insufficient documentation

## 2013-01-25 DIAGNOSIS — R7989 Other specified abnormal findings of blood chemistry: Secondary | ICD-10-CM | POA: Insufficient documentation

## 2013-01-25 DIAGNOSIS — R945 Abnormal results of liver function studies: Secondary | ICD-10-CM

## 2013-01-27 ENCOUNTER — Encounter: Payer: Self-pay | Admitting: Adult Health

## 2013-01-27 NOTE — Telephone Encounter (Signed)
3.3.14 [name] results not yet reviewed Lab results available: Result Notes    Notes Recorded by Michele Mcalpine, MD on 01/22/2013 at 2:19 PM Please notify patient>  FLP looks good on diet alone; LDL=106 7 goal is <100 therefore rec better diet, get wt down! Chems- wnl, LFTs are normal x SGOT=38 & normal is 0-37 (in my opinion this is NOT significant)... CBC, Thyroid, PSA> all WNL.Marland KitchenMarland Kitchen I Rec> proceed w/ Abd Sonar for completeness in the eval...    Lab results forwarded to pt  Advised him [name] results not yet available Will forward to SN

## 2013-01-28 ENCOUNTER — Encounter: Payer: Self-pay | Admitting: Pulmonary Disease

## 2013-07-25 ENCOUNTER — Other Ambulatory Visit: Payer: Self-pay | Admitting: Pulmonary Disease

## 2013-07-26 IMAGING — US US ABDOMEN COMPLETE
1 series · 13 of 25 positions shown · non-contrast
Comparison: None.

CLINICAL DATA: Abnormal liver function studies.

COMPLETE ABDOMINAL ULTRASOUND

[Series 1: us abdomen complete · 0.32mm/px · 13 of 86 slices shown]
[im 1/86]
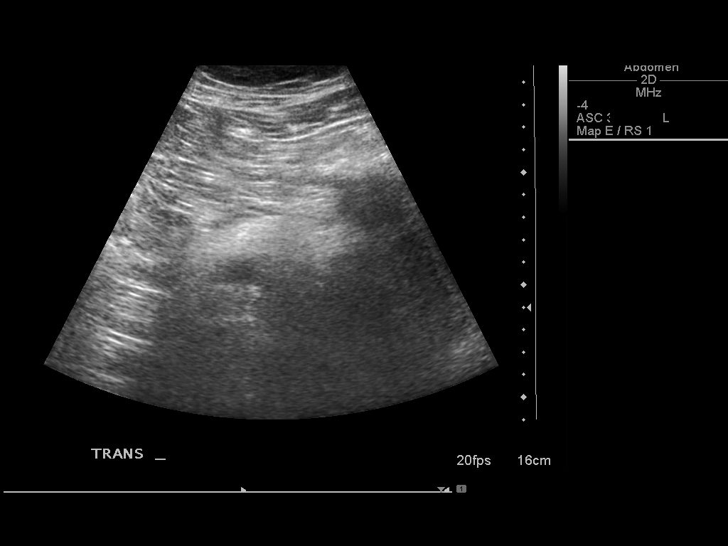
[im 8/86]
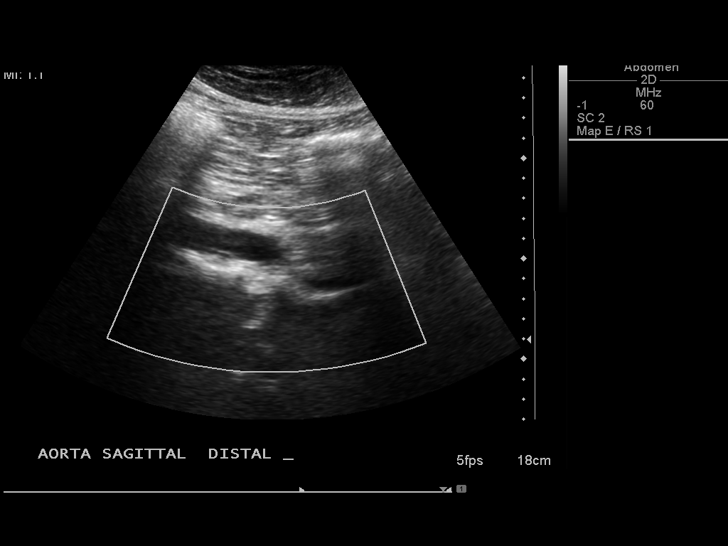
[im 15/86]
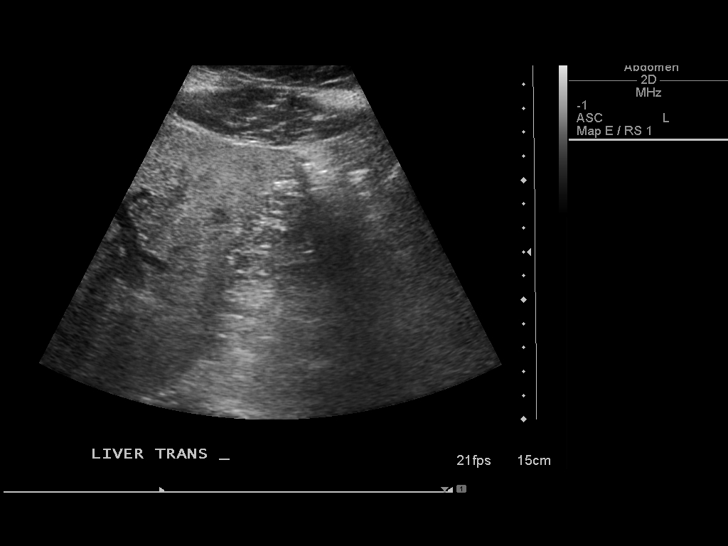
[im 22/86]
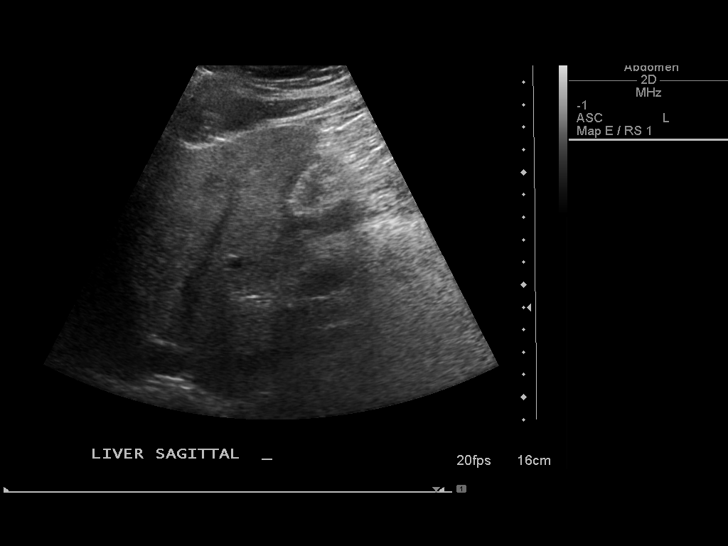
[im 29/86]
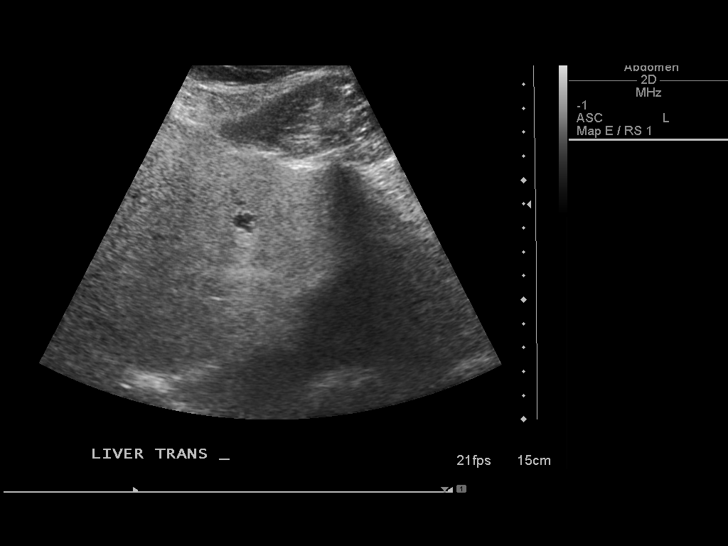
[im 36/86]
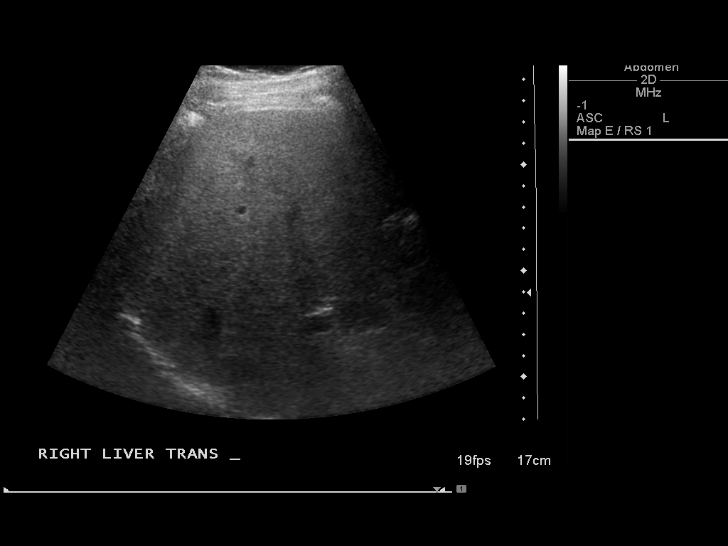
[im 43/86]
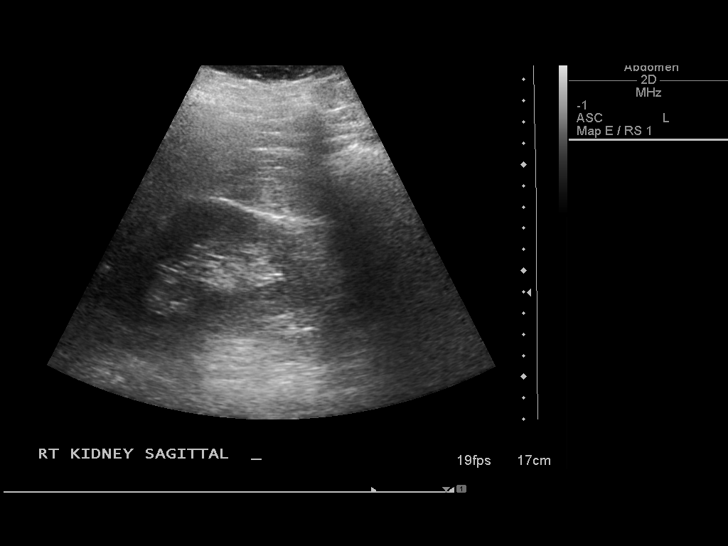
[im 50/86]
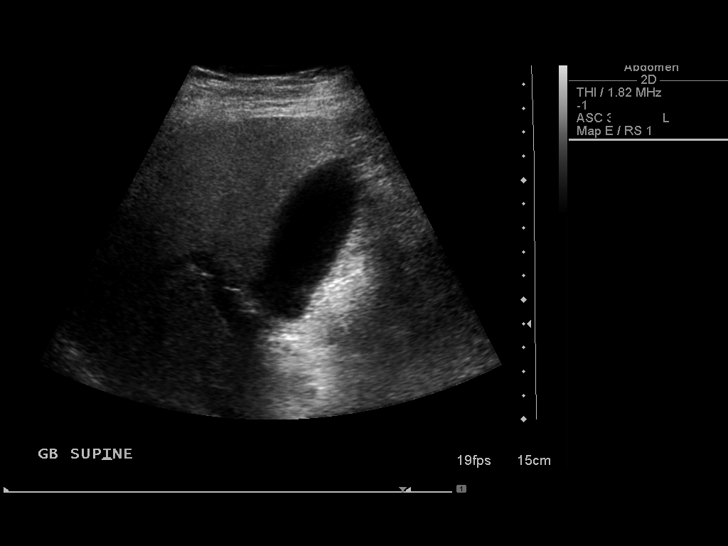
[im 57/86]
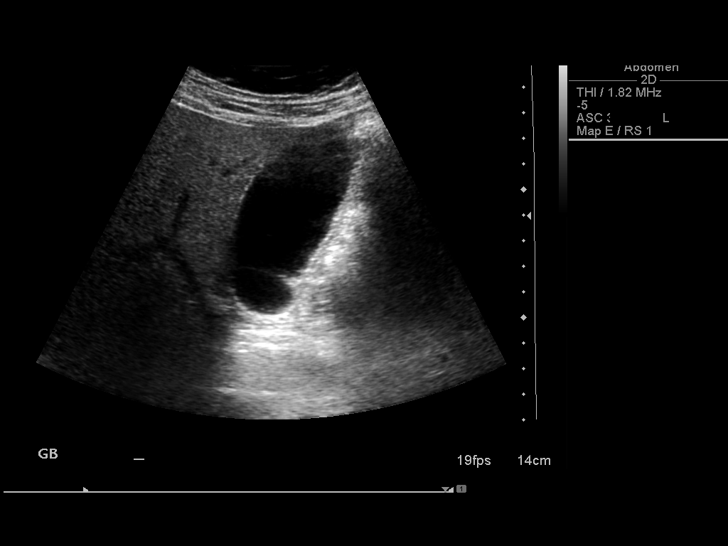
[im 64/86]
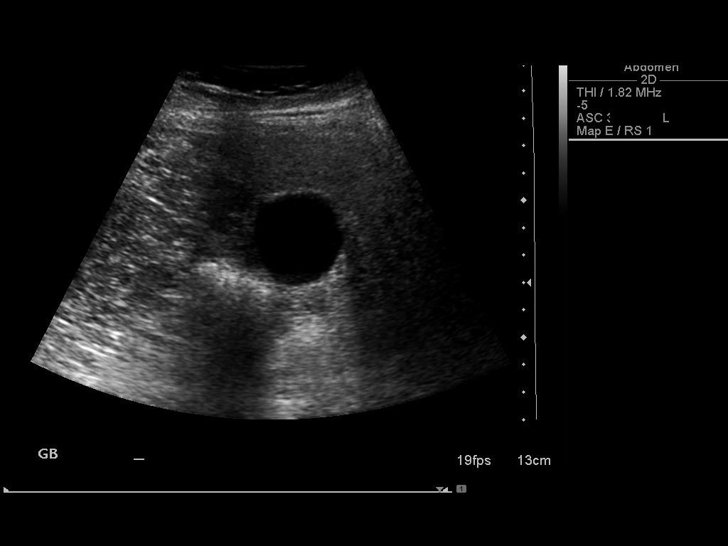
[im 71/86]
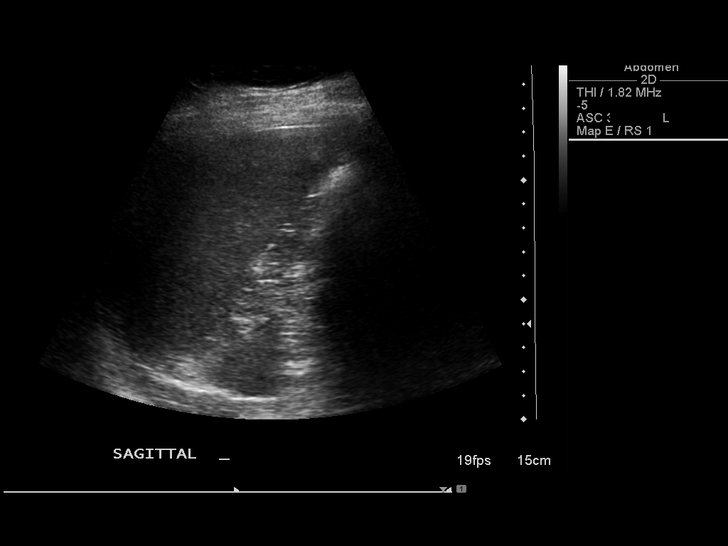
[im 78/86]
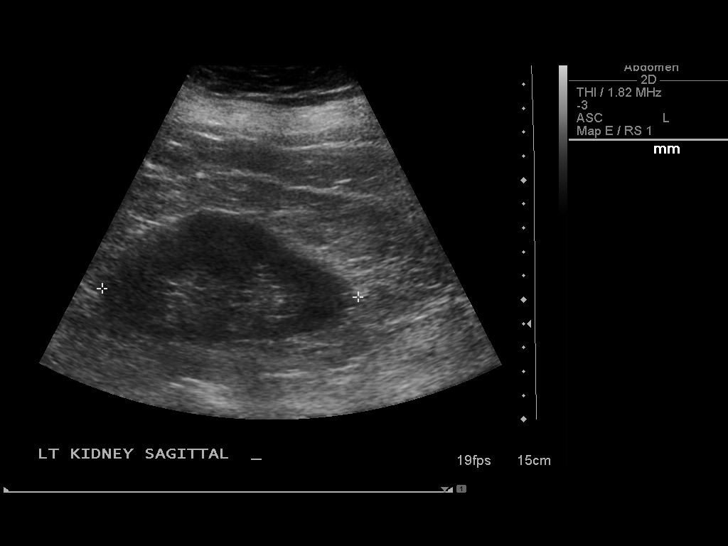
[im 86/86]
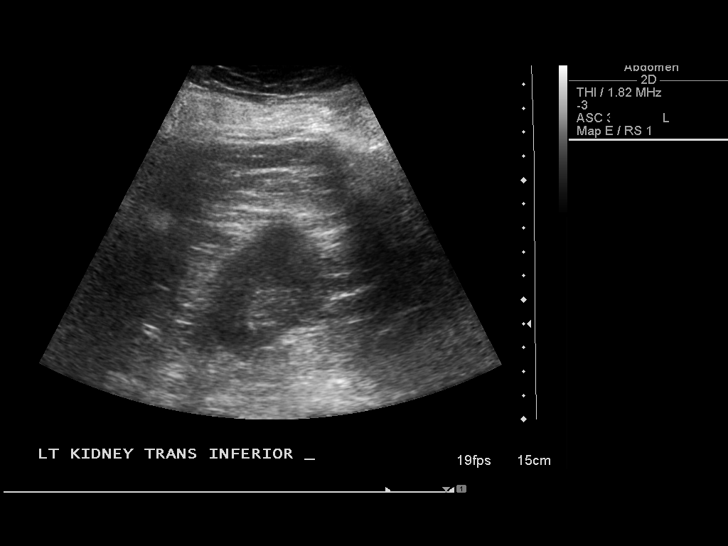

[13 of 25 positions shown; findings below may reference images not displayed]

FINDINGS: Gallbladder:  No gallstones, gallbladder wall thickening, or
pericholecystic fluid.

Common bile duct:  Normal in caliber measuring a maximum of 3.1mm.

Liver:  Coarse and increased liver echogenicitysuggesting fatty
infiltration.  Poor definition of the liver architecture.  There
are small hypoechoic lesions which are not clearly cysts.  A 1.2 x
0.8 x 1.1 cm complex cyst is noted in the right lobe.  No biliary
dilatation.

IVC:  Normal caliber.

Pancreas:  Sonographically unremarkable.

Spleen:  Normal size and echogenicity without focal lesions.

Right Kidney:  10.2 cm in length. Normal renal cortical thickness
and echogenicity without focal lesions or hydronephrosis.

Left Kidney:  10.7 cm in length. Normal renal cortical thickness
and echogenicity without focal lesions or hydronephrosis.

Abdominal aorta:  Normal caliber.
IMPRESSION: 1.  Coarse and increased liver echogenicity suggesting fatty
infiltration.  Small hypoechoic lesions not clearly cysts.  A
cm complex cyst is also noted.  Recommend MRI without and with
contrast for further evaluation of the liver.
2.  Normal gallbladder and normal caliber common bile duct.
3.  Normal sonographic appearance of the pancreas, spleen and both
kidneys.

## 2013-09-30 ENCOUNTER — Other Ambulatory Visit: Payer: Self-pay

## 2013-11-02 ENCOUNTER — Encounter: Payer: Self-pay | Admitting: *Deleted

## 2013-11-02 NOTE — Telephone Encounter (Signed)
[name] 11/02/2013 4:47 PM EST  Please update the patients vaccinations. Thanks ----- Message -----  From: Nathaniel [name]  Sent: 11/02/2013 9:52 AM  To: Mychart Admin Subject: [id number] check your MyChart account   Re Flu Shot. Received vaccination at [facility] [place] Dr. Rondall Allegra first week in September 2014. Unable to receive [place] vaccination due to medication [name] (adalimumab) for RA  ----- Message ----- From: MyChart Support Sent: 10/15/2013 4:28 PM EST To: Nathaniel [name] Subject: Please check your MyChart account  Mr. Zalesky, you have new information in MyChart, please log in to [email or web address] to check your information.

## 2013-11-26 ENCOUNTER — Telehealth: Payer: Self-pay | Admitting: Pulmonary Disease

## 2013-11-26 MED ORDER — METHYLPREDNISOLONE (PAK) 4 MG PO TABS: ORAL_TABLET | ORAL | Status: DC

## 2013-11-26 MED ORDER — FAMCICLOVIR 500 MG PO TABS: 500.0000 mg | ORAL_TABLET | Freq: Three times a day (TID) | ORAL | Status: DC

## 2013-11-26 NOTE — Telephone Encounter (Signed)
Called and spoke with pt and he stated that he is coming back home tomorrow from [facility] and stated that he will just get the meds from his regular pharmacy here.  He is aware that these have been sent in.  Pt is aware that SN still wants him to be seen.  appt scheduled with TP on Tuesday and pt is aware.  Pt to call for any problems.

## 2013-11-26 NOTE — Telephone Encounter (Signed)
lmomtcb  

## 2013-11-26 NOTE — Telephone Encounter (Signed)
Called and spoke with pt and he stated that he may have shingles.  Pt is currently in [place].   He stated that this started on Tuesday---has about 9 sores on the right lower base of his head---has not noticed that these are blisters but red areas that are very sore to touch and hurts to lay his head on the pillow at night.  He stated that he has had shingles before, but the pt is now on humira and is not sure if this would affect the outbreak.  He did state that the 9 sores are in a line.  Pt is requesting to seen on Monday, but is aware that SN is out of the office and TP is booked.  Will forward message to SN for recs.  SN please advise. Thanks  No Known Allergies   Current Outpatient Prescriptions on File Prior to Visit  Medication Sig Dispense Refill  . adalimumab ([name] PEN) 40 MG/0.8ML injection Inject 40 mg into the skin every other day.       . cholecalciferol (VITAMIN D) 1000 UNITS tablet Take 1,000 Units by mouth daily.        . clorazepate (TRANXENE) 7.5 MG tablet TAKE 1/2 TO 1 TABLET BY MOUTH UP TO TWICE DAILY AS NEEDED  60 tablet  5  . folic acid (FOLVITE) 1 MG tablet Take 1 mg by mouth daily.        . methotrexate (RHEUMATREX) 2.5 MG tablet 2  tabs by mouth once weekly      . omeprazole (PRILOSEC) 20 MG capsule Take 1 capsule (20 mg total) by mouth daily.  30 capsule  11   No current facility-administered medications on file prior to visit.

## 2013-11-26 NOTE — Telephone Encounter (Signed)
Returning call.Nathaniel [name] ° °

## 2013-11-26 NOTE — Telephone Encounter (Signed)
Per SN----  dont wait---ok to come in and see TP when able  Call in  Medrol dosepak #1  Take as directed and call in famvir 500 mg    1 po TID  #21.   Called and lmomtcb for the pt to make him aware.

## 2013-11-29 ENCOUNTER — Telehealth: Payer: Self-pay | Admitting: Pulmonary Disease

## 2013-11-29 MED ORDER — PREGABALIN 50 MG PO CAPS: 50.0000 mg | ORAL_CAPSULE | Freq: Every day | ORAL | Status: DC

## 2013-11-29 NOTE — Telephone Encounter (Signed)
Called and spoke with pt  And he stated that he will keep the appt with TP tomorrow.  Pt is aware of the lyrica that has been called to his pharmacy.  Pt will take the tramadol as prescribed.  Nothing further is needed.

## 2013-11-29 NOTE — Telephone Encounter (Signed)
Called and spoke with pt and he stated that he started on the medications for the shingles on Saturday.  He stated that the pain got worse today and pt was told by someone that they were given lyrica for the pain when they had shingles.  Pt wanted to know if he can try this?  Pt has appt with TP tomorrow but the tramadol that he has is not helping with his pain.  SN please advise. Thanks  No Known Allergies   Current Outpatient Prescriptions on File Prior to Visit  Medication Sig Dispense Refill  . adalimumab ([name] PEN) 40 MG/0.8ML injection Inject 40 mg into the skin every other day.       . cholecalciferol (VITAMIN D) 1000 UNITS tablet Take 1,000 Units by mouth daily.        . clorazepate (TRANXENE) 7.5 MG tablet TAKE 1/2 TO 1 TABLET BY MOUTH UP TO TWICE DAILY AS NEEDED  60 tablet  5  . famciclovir (FAMVIR) 500 MG tablet Take 1 tablet (500 mg total) by mouth 3 (three) times daily.  21 tablet  0  . folic acid (FOLVITE) 1 MG tablet Take 1 mg by mouth daily.        . methotrexate (RHEUMATREX) 2.5 MG tablet 2  tabs by mouth once weekly      . methylPREDNIsolone (MEDROL DOSPACK) 4 MG tablet follow package directions  21 tablet  0  . omeprazole (PRILOSEC) 20 MG capsule Take 1 capsule (20 mg total) by mouth daily.  30 capsule  11   No current facility-administered medications on file prior to visit.

## 2013-11-29 NOTE — Telephone Encounter (Signed)
Per TP: would like to see him first (needs to keep 1.6.5 ov).  In the meantime, may add Tylenol to Tramadol per Tylenol bottle instructions.  Can have Lyrica 50mg   1 po at bedtime #30, no refills.  Thanks.

## 2013-11-30 ENCOUNTER — Encounter: Payer: Self-pay | Admitting: Adult Health

## 2013-11-30 ENCOUNTER — Ambulatory Visit (INDEPENDENT_AMBULATORY_CARE_PROVIDER_SITE_OTHER): Payer: BC Managed Care – PPO | Admitting: Adult Health

## 2013-11-30 VITALS — HR 79 | Temp 96.9°F | Ht 67.0 in | Wt 251.0 lb

## 2013-11-30 DIAGNOSIS — B0229 Other postherpetic nervous system involvement: Secondary | ICD-10-CM | POA: Insufficient documentation

## 2013-11-30 DIAGNOSIS — B029 Zoster without complications: Secondary | ICD-10-CM

## 2013-11-30 NOTE — Assessment & Plan Note (Signed)
Finish Famvir and Medrol as directed.  Add Lyrica 50mg  At bedtime  .  follow up Dr. Lenna Gilford  In 6-8 weeks for physical  Please contact office for sooner follow up if symptoms do not improve or worsen or seek emergency care

## 2013-11-30 NOTE — Assessment & Plan Note (Signed)
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  Add Lyrica 50mg  At bedtime  .  follow up Dr. Lenna Gilford  In 6-8 weeks for physical  Please contact office for sooner follow up if symptoms do not improve or worsen or seek emergency care

## 2013-11-30 NOTE — Patient Instructions (Signed)
Finish Famvir and Medrol as directed.  Add Lyrica 50mg At bedtime  .  follow up Dr. Nadel  In 6-8 weeks for physical  Please contact office for sooner follow up if symptoms do not improve or worsen or seek emergency care   
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
## 2013-12-01 ENCOUNTER — Telehealth: Payer: Self-pay | Admitting: Pulmonary Disease

## 2013-12-01 NOTE — Telephone Encounter (Signed)
Pt is calling and requesting something that he can take during the day for the shingles.  He has the lyrica that he takes at night.  Pt was seen by TP on Tuesday.  Pt was started on medrol and famvir on 1/3.  SN please advise. Thanks  No Known Allergies   Current Outpatient Prescriptions on File Prior to Visit  Medication Sig Dispense Refill  . adalimumab ([name] PEN) 40 MG/0.8ML injection Inject 40 mg into the skin every other day.       . cholecalciferol (VITAMIN D) 1000 UNITS tablet Take 1,000 Units by mouth daily.        . clorazepate (TRANXENE) 7.5 MG tablet TAKE 1/2 TO 1 TABLET BY MOUTH UP TO TWICE DAILY AS NEEDED  60 tablet  5  . famciclovir (FAMVIR) 500 MG tablet Take 1 tablet (500 mg total) by mouth 3 (three) times daily.  21 tablet  0  . folic acid (FOLVITE) 1 MG tablet Take 1 mg by mouth daily.        . methotrexate (RHEUMATREX) 2.5 MG tablet 2  tabs by mouth once weekly      . methylPREDNIsolone (MEDROL DOSPACK) 4 MG tablet follow package directions  21 tablet  0  . pregabalin (LYRICA) 50 MG capsule Take 1 capsule (50 mg total) by mouth at bedtime.  30 capsule  0  . PRILOSEC OTC 20 MG tablet Take 1 tablet by mouth daily before breakfast.      . traMADol-acetaminophen (ULTRACET) 37.5-325 MG per tablet Take 1 tablet by mouth every 6 (six) hours as needed.       No current facility-administered medications on file prior to visit.

## 2013-12-01 NOTE — Telephone Encounter (Signed)
Per SN---  Pt can use the ultracet as directed to see if this will help him during the day.  Continue the lyrica at bedtime to help with the pain.  Have pt call back if this is not working for him and if he needs something stronger he will need to come to our office and pick this up.    Called and spoke with pt and he is aware of SN recs and he will try this.  Pt to call back if this is not working for him.

## 2014-02-18 ENCOUNTER — Ambulatory Visit (INDEPENDENT_AMBULATORY_CARE_PROVIDER_SITE_OTHER): Payer: BC Managed Care – PPO | Admitting: Pulmonary Disease

## 2014-02-18 ENCOUNTER — Encounter: Payer: Self-pay | Admitting: Internal Medicine

## 2014-02-18 ENCOUNTER — Ambulatory Visit (INDEPENDENT_AMBULATORY_CARE_PROVIDER_SITE_OTHER)
Admission: RE | Admit: 2014-02-18 | Discharge: 2014-02-18 | Disposition: A | Discharge status: Home / Self Care | Payer: BC Managed Care – PPO | Source: Ambulatory Visit | Attending: Pulmonary Disease | Admitting: Pulmonary Disease

## 2014-02-18 ENCOUNTER — Encounter: Payer: Self-pay | Admitting: Pulmonary Disease

## 2014-02-18 ENCOUNTER — Other Ambulatory Visit (INDEPENDENT_AMBULATORY_CARE_PROVIDER_SITE_OTHER): Payer: BC Managed Care – PPO

## 2014-02-18 VITALS — BP 146/90 | HR 68 | Temp 96.9°F | Ht 67.0 in | Wt 250.6 lb

## 2014-02-18 DIAGNOSIS — G4700 Insomnia, unspecified: Secondary | ICD-10-CM

## 2014-02-18 DIAGNOSIS — M069 Rheumatoid arthritis, unspecified: Secondary | ICD-10-CM

## 2014-02-18 DIAGNOSIS — R7989 Other specified abnormal findings of blood chemistry: Secondary | ICD-10-CM

## 2014-02-18 DIAGNOSIS — R945 Abnormal results of liver function studies: Secondary | ICD-10-CM

## 2014-02-18 DIAGNOSIS — R0683 Snoring: Secondary | ICD-10-CM

## 2014-02-18 DIAGNOSIS — Z0000 Encounter for general adult medical examination without abnormal findings: Secondary | ICD-10-CM

## 2014-02-18 DIAGNOSIS — J301 Allergic rhinitis due to pollen: Secondary | ICD-10-CM

## 2014-02-18 DIAGNOSIS — E663 Overweight: Secondary | ICD-10-CM

## 2014-02-18 DIAGNOSIS — R0989 Other specified symptoms and signs involving the circulatory and respiratory systems: Secondary | ICD-10-CM

## 2014-02-18 DIAGNOSIS — K219 Gastro-esophageal reflux disease without esophagitis: Secondary | ICD-10-CM

## 2014-02-18 DIAGNOSIS — R0609 Other forms of dyspnea: Secondary | ICD-10-CM

## 2014-02-18 LAB — LIPID PANEL
CHOL/HDL RATIO: 4
Cholesterol: 177 mg/dL (ref 0–200)
HDL: 41.9 mg/dL (ref >39.00)
LDL Cholesterol: 108 mg/dL — ABNORMAL HIGH (ref 0–99)
Triglycerides: 137 mg/dL (ref 0.0–149.0)
VLDL: 27.4 mg/dL (ref 0.0–40.0)

## 2014-02-18 LAB — CBC WITH DIFFERENTIAL/PLATELET
BASOS ABS: 0 10*3/uL (ref 0.0–0.1)
Basophils Relative: 0.5 % (ref 0.0–3.0)
Eosinophils Absolute: 0.1 10*3/uL (ref 0.0–0.7)
Eosinophils Relative: 1.3 % (ref 0.0–5.0)
HCT: 47.5 % (ref 39.0–52.0)
Hemoglobin: 16.3 g/dL (ref 13.0–17.0)
LYMPHS ABS: 2.4 10*3/uL (ref 0.7–4.0)
LYMPHS PCT: 28.7 % (ref 12.0–46.0)
MCHC: 34.3 g/dL (ref 30.0–36.0)
MCV: 88.3 fl (ref 78.0–100.0)
MONOS PCT: 8.6 % (ref 3.0–12.0)
Monocytes Absolute: 0.7 10*3/uL (ref 0.1–1.0)
NEUTROS ABS: 5.1 10*3/uL (ref 1.4–7.7)
Neutrophils Relative %: 60.9 % (ref 43.0–77.0)
PLATELETS: 233 10*3/uL (ref 150.0–400.0)
RBC: 5.38 Mil/uL (ref 4.22–5.81)
RDW: 13 % (ref 11.5–14.6)
WBC: 8.3 10*3/uL (ref 4.5–10.5)

## 2014-02-18 LAB — TSH: TSH: 1.7 u[IU]/mL (ref 0.35–5.50)

## 2014-02-18 LAB — HEPATIC FUNCTION PANEL
ALBUMIN: 4.5 g/dL (ref 3.5–5.2)
ALT: 91 U/L — ABNORMAL HIGH (ref 0–53)
AST: 78 U/L — AB (ref 0–37)
Alkaline Phosphatase: 56 U/L (ref 39–117)
BILIRUBIN DIRECT: 0.2 mg/dL (ref 0.0–0.3)
TOTAL PROTEIN: 7.9 g/dL (ref 6.0–8.3)
Total Bilirubin: 1.2 mg/dL (ref 0.3–1.2)

## 2014-02-18 LAB — BASIC METABOLIC PANEL
BUN: 14 mg/dL (ref 6–23)
CHLORIDE: 102 meq/L (ref 96–112)
CO2: 25 mEq/L (ref 19–32)
Calcium: 9.9 mg/dL (ref 8.4–10.5)
Creatinine, Ser: 1 mg/dL (ref 0.4–1.5)
GFR: 81.41 mL/min (ref >60.00)
GLUCOSE: 108 mg/dL — AB (ref 70–99)
POTASSIUM: 4.3 meq/L (ref 3.5–5.1)
SODIUM: 136 meq/L (ref 135–145)

## 2014-02-18 LAB — PSA: PSA: 0.5 ng/mL (ref 0.10–4.00)

## 2014-02-18 MED ORDER — HYDROCORTISONE 2.5 % RE CREA: 1.0000 "application " | TOPICAL_CREAM | Freq: Two times a day (BID) | RECTAL | Status: DC

## 2014-02-18 MED ORDER — OMEPRAZOLE MAGNESIUM 20 MG PO TBEC: 20.0000 mg | DELAYED_RELEASE_TABLET | Freq: Every day | ORAL | Status: DC

## 2014-02-18 MED ORDER — ZOLPIDEM TARTRATE 10 MG PO TABS: 10.0000 mg | ORAL_TABLET | Freq: Every evening | ORAL | Status: AC | PRN

## 2014-02-18 NOTE — Progress Notes (Signed)
Subjective:     Patient ID: Nathaniel [name], male   DOB: 03-31-1951, 63 y.o.   MRN: [id number]  HPI 40 y/o WF, son of Johsua Schlieper, here for a follow up visit & CPX... 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 prev LMD was DrKalish, & he has a hx of RA on MTX & [name]- followed by Glean Salen every 25mo...  ~  October 25, 2010:  overall doing well- concern over side effects of [name] but RA is controlled (DrZ)... no resp problems x some snoring, but no evid sleep disordered breathing or daytime hypersom- rest well, wakes refreshed, he is overwt & had difficulty w/ wt reduction... no CP, palpit, SOB, edema, etc... denies GI symptoms but he's never had a colonoscopy & we will refer to GI for this... PSA last yr from [facility] screening was OK & PSA today= 0.30...  ~  January 21, 2013:  66month ROV & CPX> Nathaniel [name] continues his regular f/u w/ DrBeekman for Rheum- on [name] & MTX; we do not have any recent notes from Rheum & we have called for these but so far we have not received any records; He indicates that he gets lab work every 38mo as well & he is concerned about a report of elev LFTs and DrBeekman requested an Abd Ultrasound- pending (pt is concerned about his insurance coverage)...  We reviewed the following medical problems during today's office visit >>     AR> he uses OTC antihis as needed...    Snoring> Nathaniel [name] mentioned snoring as a problem (denies daytime hypersomnolence) & we discussed the work-up & offered sleep study when he is ready (he doesn't want it now because he thinks the CPAP will scare his cat)...    Overweight> his weight remains similar to several yrs ago- weight = 238# & we reviewed diet, exercise, wt reduction strategies...    GERD> he has not had GI eval but notes occas epis of heartburn related to food intake but no abd pain, dysphagia, n/v, etc...    Need for colonoscopy> he has never had a screening colonoscopy & is advised to set up this important screening  procedure...    Elev LFTs> he is concerned over recent report of elev LFTs from [facility]; they have ordered an AbdUltrasound but pt is reluctant due to cost; repeat labs here were WNL x SGOT=38 (nl 0-37) and he is reassured; he denies abd pain, n/v, c/d, blood seen; ultrasounds are no longer done "in house" by our GI division & we will sched it to be done out pt=> fatty liver (needs to lose wt) & ?sm cysts w/ MRI suggested (pt never had this done)...    RA> he remains under the care of [place] for Rheum- on [name] & MTX, +Folate & VitD, we have requested recent notes to review...    Anxiety> he was prev on Alpraz0.5 but he states it gave him HAs; requests diff anxiolytic Rx & we chose Chlorazepate 7.5mg  prn...    Skin lesions> he has mult SKs & is referred to Derm to consider excision as necessary... We reviewed prob list, meds, xrays and labs> see below for updates >>   LABS 2/14:  FLP- at goals on diet alone x LDL=106;  Chem- wnl x SGOT=38;  CBC- wnl;  TSH=1.67;  PSA=0.48  Abd Sonar => done 3/14 & showed incr liver echogenicity suggesting fatty infiltration, sm hypoechoic lesions (not clearly cysts), 1.2cm complex cyst, normal GB/ ducts/ pancreas/ spleen/  kidneys (they rec MRI=> pt never had this done)...   ~  February 18, 2014:  42mo ROV & CPX> Nathaniel [name] continues his regular follow up w/ DrBeekman for his RA on [name] & MTX, they do regular labs, last note from them was 7/14- note reviewed, CCP+ RA w/ right wrist synovitis (injected), erosions on XRay, cervicalgia, LFTs reported to have normalized... He had right V1 shingles 1/15 treated by TP w/ Famvir & Medrol and resolved (transient Lyrica for shingles pain)...     AR> he uses OTC antihis as needed...    Snoring> Nathaniel [name] mentioned snoring as a problem (denies daytime hypersomnolence) & we discussed work-up & offered sleep study when he is ready (he doesn't want it because he thinks the CPAP will scare his cat).....    GERD> on Prilosec20; he has not had  GI eval but notes occas epis of heartburn related to food intake but no abd pain, dysphagia, n/v, etc...    Need for colonoscopy> he has never had a screening colonoscopy & is advised to set up this important screening procedure=> referred; he uses [place] cream for hems as needed...    Elev LFTs> LFTs were min elev per DrBeekman & AbdSonar 3/14 showed incr liver echo texture c/w FLD & ?sm hypoechoic lesions w/ MRI suggested; he has not yet had this done; Labs 3/15 are worse w/ GOT=78 & GPT=91 assoc w/ wt gain to 251# (BMI=40); we reviewed need for wt reduction & will sched his MRI liver=> pending.    Obesity>his weight is up to 251# w/ assoc worsening LFTs (fatty liver dis) & we reviewed diet, exercise, wt reduction strategies. he has not taken wt reduction seriously- we reviewed FLD & imperative for wt reduction, no Etoh or hepatotoxins, etc...    RA> he remains under the care of [place] for Rheum- on [name] & MTX, +Folate & VitD, they continue to do labs every 3-37mo etc...    Anxiety> he was prev on Alpraz0.5 but he states it gave him HAs; subseq tried Chlorazepate 7.5mg  prn=> off now & requests Ambien10 prn sleep...    Skin lesions> he has mult SKs & is referred to Derm to consider excision as necessary... We reviewed prob list, meds, xrays and labs> see below for updates >>   LABS 3/15:  FLP- ok on diet;  Chems- wnl x LFTs (GOT=78 GPT=91);  CBC- wnl;  TSH- 1.70;  PSA=0.50  CXR 3/15 showed borderline heart size, mild apical pleural thickening c/w scarring, mild basilar atx. DJD in Tspine & shoulders, NAD.Marland KitchenMarland Kitchen   EKG 3/15 showed NSR, rate63, WNL, NAD...  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   SNORING >> Nathaniel [name] mentioned snoring as a problem 2/14 (denies daytime hypersomnolence) & we discussed the work-up & offered sleep study when he is ready (he doesn't want it now because he thinks the CPAP will scare his  cat)...  OVERWEIGHT (ICD-278.02) - we discussed diet + exercise program suffic to lose weight. ~  Weight 11/11 = 231#,  5\' 8"  tall,  BMI= 35 ~  Weight 2/14 = 238# ~  Weight 3/15 = 251# and we reviewed the imperative to lose the weight due to FLD & snoring issues...  GERD >> he has not had GI eval but notes occas epis of heartburn related to food intake, on Prilosec20, denies abd pain, dysphagia, n/v, etc...  Need for colonoscopy >>  he has never had a screening colonoscopy & is advised to set up this important screening procedure ASAP (referred to [facility])...  Elev LFTs >> he mentioned report of elev LFTs from [facility]; repeat labs here 2/14 were WNL x SGOT=38 (nl 0-37); he denies abd pain, n/v, c/d, blood seen... ~  Abd Sonar 3/14 showed  incr liver echogenicity suggesting fatty infiltration, sm hypoechoic lesions (not clearly cysts), 1.2cm complex cyst, normal GB/ ducts/ pancreas/ spleen/ kidneys (they rec MRI=> pt never had this done)... ~  Labs 3/15 showed GOT=78 & GPT=91 assoc w/ wt gain to 251# (BMI=40); we reviewed need for wt reduction & will sched his MRI liver=> pending.  RHEUMATOID ARTHRITIS (ICD-714.0) - followed by DrZieminski every 41mo on METHOTREXATE 2.5mg - taking 3tabs Qwk, and [name] every other week... he is doing extremely well- asymptomatic & no stigmata of RA.. ~  2/14: he remains under the care of [place] for Rheum- on [name] & MTX, +Folate & VitD, we have requested recent notes to review... ~  3/15: he remains under the care of [place] for Rheum- on [name] & MTX, +Folate & VitD, they do regular labs, last note from them was 7/14- reviewed, CCP+ RA w/ right wrist synovitis (injected), erosions on XRay, cervicalgia, LFTs reported to have normalized.  ANXIETY (ICD-300.00) - he has some mild anxiety related to his work & RA diagnosis & Rx... we discussed trial ALPRAZOLAM 0.5mg - 1/2 to 1 tab Tid Prn... ~  2/14:  he was prev on Alpraz0.5 but he states it gave him HAs; requests  diff anxiolytic Rx & we chose Chlorazepate 7.5mg  prn... ~  3/15:  No longer taking the Clorazepate, requesting Zolpidem 10mg  for sleep prn...  Health Maintenance: ~  GI:  no signif GI hx & he has never had a colonoscopy> we will refer to GI for this important screening procedure... ~  GU:  he had DRE (neg) and PSAs have all been WNL... ~  Immunizations:  he is on the immunosuppressives=> he had Pneumovax 9/10, TDAP 10/13, and yearly flu vaccine in the fall...   Past Surgical History  Procedure Laterality Date  . Hemorroidectomy      Dr. Deon Pilling    Outpatient Encounter Prescriptions as of 02/18/2014  Medication Sig  . adalimumab ([name] PEN) 40 MG/0.8ML injection Inject 40 mg into the skin every 14 (fourteen) days.   . cholecalciferol (VITAMIN D) 1000 UNITS tablet Take 1,000 Units by mouth daily.    . folic acid (FOLVITE) 1 MG tablet Take 1 mg by mouth daily.    . methotrexate (RHEUMATREX) 2.5 MG tablet 2  tabs by mouth once weekly  . PRILOSEC OTC 20 MG tablet Take 1 tablet by mouth daily before breakfast.  . traMADol-acetaminophen (ULTRACET) 37.5-325 MG per tablet Take 1 tablet by mouth every 6 (six) hours as needed.  . [DISCONTINUED] clorazepate (TRANXENE) 7.5 MG tablet TAKE 1/2 TO 1 TABLET BY MOUTH UP TO TWICE DAILY AS NEEDED  . [DISCONTINUED] famciclovir (FAMVIR) 500 MG tablet Take 1 tablet (500 mg total) by mouth 3 (three) times daily.  . [DISCONTINUED] methylPREDNIsolone (MEDROL DOSPACK) 4 MG tablet follow package directions  . [DISCONTINUED] pregabalin (LYRICA) 50 MG capsule Take 1 capsule (50 mg total) by mouth at bedtime.    No Known Allergies   Current Medications, Allergies, Past Medical History, Past Surgical History, Family History, and Social History were reviewed in [employer] record.   Review of Systems         The  patient complains of dyspnea on exertion, arthritis, anxiety, and hay fever.  The patient denies fever, chills, sweats, anorexia,  fatigue, weakness, malaise, weight loss, sleep disorder, blurring, diplopia, eye irritation, eye discharge, vision loss, eye pain, photophobia, earache, ear discharge, tinnitus, decreased hearing, nasal congestion, nosebleeds, sore throat, hoarseness, chest pain, palpitations, syncope, orthopnea, PND, peripheral edema, cough, dyspnea at rest, excessive sputum, hemoptysis, wheezing, pleurisy, nausea, vomiting, diarrhea, constipation, change in bowel habits, abdominal pain, melena, hematochezia, jaundice, gas/bloating, indigestion/heartburn, dysphagia, odynophagia, dysuria, hematuria, urinary frequency, urinary hesitancy, nocturia, incontinence, back pain, joint pain, joint swelling, muscle cramps, muscle weakness, stiffness, sciatica, restless legs, leg pain at night, leg pain with exertion, rash, itching, dryness, suspicious lesions, paralysis, paresthesias, seizures, tremors, vertigo, transient blindness, frequent falls, frequent headaches, difficulty walking, depression, memory loss, confusion, cold intolerance, heat intolerance, polydipsia, polyphagia, polyuria, unusual weight change, abnormal bruising, bleeding, enlarged lymph nodes, urticaria, allergic rash, and recurrent infections.     Objective:   Physical Exam      WD, overweight, 63 y/o WM in NAD... GENERAL:  Alert & oriented; pleasant & cooperative... HEENT:  [place]/AT, EOM-wnl, PERRLA, Fundi-benign, EACs-clear, TMs-wnl, NOSE-clear, THROAT-clear & wnl. NECK:  Supple w/ full ROM; no JVD; normal carotid impulses w/o bruits; no thyromegaly or nodules palpated; no lymphadenopathy. CHEST:  Clear to P & A; without wheezes/ rales/ or rhonchi. HEART:  Regular Rhythm; without murmurs/ rubs/ or gallops. ABDOMEN:  Soft & nontender; normal bowel sounds; no organomegaly or masses detected. RECTAL:  Neg - prostate 2+ & nontender w/o nodules; stool hematest neg. EXT: without deformities or arthritic changes; no active synovitis; no varicose veins/ venous  insuffic/ or edema. NEURO:  CN's intact; motor testing normal; sensory testing normal; gait normal & balance OK. DERM:  Mult SKs noted...  RADIOLOGY DATA:  Reviewed in the EPIC EMR & discussed w/ the patient...  LABORATORY DATA:  Reviewed in the EPIC EMR & discussed w/ the patient...   Assessment:      AR/ Snoring>  Offered sleep study but he declines at this time & will let me know when he is ready to proceed...  Overweight>  We reviewed diet, exercise, wt reduction strategies...  GI- GERD, needs colonoscopy, elev LFTs>  We reviewed Prilosec vs Pepcid OTC for reflux symptoms; he needs to sched screening colon; Sonar w/ FLD & sm hypoechoic lesions=> needs MRI for further eval...  RA>  He remains on [name] & low dose MTX from [facility]...  Anxiety, Insomnia>   He requests Ambien 10mg  to use as needed for sleep...  Skin Lesions>  Mult SKs could be removed by Derm at his request, he will call Dr?Hall; had right V1 shingles 1/15 treated & resolved...Marland KitchenMarland KitchenMarland Kitchen     Plan:     Patient's Medications  New Prescriptions   HYDROCORTISONE (ANUSOL-HC) 2.5 % RECTAL CREAM    Place 1 application rectally 2 (two) times daily.   ZOLPIDEM (AMBIEN) 10 MG TABLET    Take 1 tablet (10 mg total) by mouth at bedtime as needed for sleep.  Previous Medications   ADALIMUMAB ([name] PEN) 40 MG/0.8ML INJECTION    Inject 40 mg into the skin every 14 (fourteen) days.    CHOLECALCIFEROL (VITAMIN D) 1000 UNITS TABLET    Take 1,000 Units by mouth daily.     FOLIC ACID (FOLVITE) 1 MG TABLET    Take 1 mg by mouth daily.     METHOTREXATE (RHEUMATREX) 2.5 MG TABLET    2  tabs by mouth once weekly  TRAMADOL-ACETAMINOPHEN (ULTRACET) 37.5-325 MG PER TABLET    Take 1 tablet by mouth every 6 (six) hours as needed.  Modified Medications   Modified Medication Previous Medication   OMEPRAZOLE (PRILOSEC OTC) 20 MG TABLET PRILOSEC OTC 20 MG tablet      Take 1 tablet (20 mg total) by mouth daily before breakfast.    Take 1 tablet  by mouth daily before breakfast.  Discontinued Medications   CLORAZEPATE (TRANXENE) 7.5 MG TABLET    TAKE 1/2 TO 1 TABLET BY MOUTH UP TO TWICE DAILY AS NEEDED   FAMCICLOVIR (FAMVIR) 500 MG TABLET    Take 1 tablet (500 mg total) by mouth 3 (three) times daily.   METHYLPREDNISOLONE (MEDROL DOSPACK) 4 MG TABLET    follow package directions   PREGABALIN (LYRICA) 50 MG CAPSULE    Take 1 capsule (50 mg total) by mouth at bedtime.

## 2014-02-18 NOTE — Patient Instructions (Signed)
Today we updated your med list in our EPIC system...    Continue your current medications the same...  We wrote new prescriptions for the Prilosec, anusolHC cream & generic Ambien as we discussed...  Today we did your follow up CXR, EKG, & fasting blood work...    We will contact you w/ the results when available...   Call for any questions or if we can be of service in any way.Marland KitchenMarland Kitchen

## 2014-02-22 ENCOUNTER — Other Ambulatory Visit: Payer: Self-pay | Admitting: Pulmonary Disease

## 2014-02-22 DIAGNOSIS — R7989 Other specified abnormal findings of blood chemistry: Secondary | ICD-10-CM

## 2014-02-22 DIAGNOSIS — R945 Abnormal results of liver function studies: Secondary | ICD-10-CM

## 2014-02-24 ENCOUNTER — Telehealth: Payer: Self-pay | Admitting: Pulmonary Disease

## 2014-02-24 DIAGNOSIS — R911 Solitary pulmonary nodule: Secondary | ICD-10-CM

## 2014-02-24 NOTE — Telephone Encounter (Signed)
SN will do the peer to peer this afternoon.  Pt is aware that we will call once this has been done.

## 2014-02-24 NOTE — Telephone Encounter (Signed)
Has the peer to peer been done? Thanks!

## 2014-02-25 ENCOUNTER — Ambulatory Visit
Admission: RE | Admit: 2014-02-25 | Discharge: 2014-02-25 | Disposition: A | Discharge status: Home / Self Care | Payer: BC Managed Care – PPO | Source: Ambulatory Visit | Attending: Pulmonary Disease | Admitting: Pulmonary Disease

## 2014-02-25 ENCOUNTER — Other Ambulatory Visit: Payer: BC Managed Care – PPO

## 2014-02-25 DIAGNOSIS — R7989 Other specified abnormal findings of blood chemistry: Secondary | ICD-10-CM

## 2014-02-25 DIAGNOSIS — R945 Abnormal results of liver function studies: Secondary | ICD-10-CM

## 2014-02-25 MED ORDER — GADOBENATE DIMEGLUMINE 529 MG/ML IV SOLN
20.0000 mL | Freq: Once | INTRAVENOUS | Status: AC | PRN
  Administered 2014-02-25: 20 mL via INTRAVENOUS

## 2014-02-27 ENCOUNTER — Encounter: Payer: Self-pay | Admitting: Pulmonary Disease

## 2014-02-28 NOTE — Telephone Encounter (Signed)
MRI has been done and pt is aware of results per SN.  Pt will need to have CT scan done to eval the RLL lesion.  Order has been placed.

## 2014-03-01 ENCOUNTER — Ambulatory Visit
Admission: RE | Admit: 2014-03-01 | Discharge: 2014-03-01 | Disposition: A | Discharge status: Home / Self Care | Payer: BC Managed Care – PPO | Source: Ambulatory Visit | Attending: Pulmonary Disease | Admitting: Pulmonary Disease

## 2014-03-01 DIAGNOSIS — R911 Solitary pulmonary nodule: Secondary | ICD-10-CM

## 2014-03-02 ENCOUNTER — Encounter: Payer: Self-pay | Admitting: Pulmonary Disease

## 2014-03-02 ENCOUNTER — Ambulatory Visit (INDEPENDENT_AMBULATORY_CARE_PROVIDER_SITE_OTHER)
Admission: RE | Admit: 2014-03-02 | Discharge: 2014-03-02 | Disposition: A | Discharge status: Home / Self Care | Payer: BC Managed Care – PPO | Source: Ambulatory Visit | Attending: Pulmonary Disease | Admitting: Pulmonary Disease

## 2014-03-02 DIAGNOSIS — J984 Other disorders of lung: Secondary | ICD-10-CM

## 2014-03-02 MED ORDER — IOHEXOL 300 MG/ML  SOLN
80.0000 mL | Freq: Once | INTRAMUSCULAR | Status: AC | PRN
  Administered 2014-03-02: 80 mL via INTRAVENOUS

## 2014-03-03 ENCOUNTER — Other Ambulatory Visit: Payer: BC Managed Care – PPO

## 2014-03-04 ENCOUNTER — Other Ambulatory Visit: Payer: BC Managed Care – PPO

## 2014-03-22 ENCOUNTER — Ambulatory Visit: Payer: BC Managed Care – PPO | Admitting: Adult Health

## 2014-04-12 ENCOUNTER — Ambulatory Visit (INDEPENDENT_AMBULATORY_CARE_PROVIDER_SITE_OTHER): Payer: BC Managed Care – PPO | Admitting: Internal Medicine

## 2014-04-12 ENCOUNTER — Encounter: Payer: Self-pay | Admitting: Internal Medicine

## 2014-04-12 VITALS — BP 160/98 | HR 80 | Ht 67.0 in | Wt 235.6 lb

## 2014-04-12 DIAGNOSIS — K219 Gastro-esophageal reflux disease without esophagitis: Secondary | ICD-10-CM

## 2014-04-12 DIAGNOSIS — Z1211 Encounter for screening for malignant neoplasm of colon: Secondary | ICD-10-CM

## 2014-04-12 MED ORDER — MOVIPREP 100 G PO SOLR: 1.0000 | Freq: Once | ORAL | Status: DC
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
## 2014-04-12 NOTE — Patient Instructions (Signed)

## 2014-04-13 ENCOUNTER — Ambulatory Visit (AMBULATORY_SURGERY_CENTER): Payer: BC Managed Care – PPO | Admitting: Internal Medicine

## 2014-04-13 ENCOUNTER — Encounter: Payer: Self-pay | Admitting: Internal Medicine

## 2014-04-13 VITALS — BP 159/89 | HR 63 | Temp 97.5°F | Resp 21 | Ht 67.0 in | Wt 235.0 lb

## 2014-04-13 DIAGNOSIS — Z1211 Encounter for screening for malignant neoplasm of colon: Secondary | ICD-10-CM

## 2014-04-13 MED ORDER — SODIUM CHLORIDE 0.9 % IV SOLN: 500.0000 mL | INTRAVENOUS | Status: DC

## 2014-04-13 NOTE — Progress Notes (Signed)
Procedure ends, to recovery, report given and VSS. 

## 2014-04-13 NOTE — Op Note (Signed)
[facility]  [address] [place], [address]   COLONOSCOPY PROCEDURE REPORT  PATIENT: [name], Nathaniel  MR#: [id number] BIRTHDATE: Sep 19, 1951 , 87  yrs. old GENDER: Male ENDOSCOPIST: Eustace Quail, MD REFERRED IF:OYDXA Lenna Gilford, M.D. PROCEDURE DATE:  04/13/2014 PROCEDURE:   Colonoscopy, screening First Screening Colonoscopy - Avg.  risk and is 50 yrs.  old or older Yes.  Prior Negative Screening - Now for repeat screening. N/A  History of Adenoma - Now for follow-up colonoscopy & has been > or = to 3 yrs.  N/A  Polyps Removed Today? No.  Recommend repeat exam, <10 yrs? No. ASA CLASS:   Class II INDICATIONS:average risk screening. MEDICATIONS: MAC sedation, administered by CRNA and propofol (Diprivan) 400mg  IV  DESCRIPTION OF PROCEDURE:   After the risks benefits and alternatives of the procedure were thoroughly explained, informed consent was obtained.  A digital rectal exam revealed no abnormalities of the rectum.   The LB [id number]  endoscope was introduced through the anus and advanced to the cecum, which was identified by both the appendix and ileocecal valve. No adverse events experienced.   The quality of the prep was good, using MoviPrep  The instrument was then slowly withdrawn as the colon was fully examined.  COLON FINDINGS: Moderate diverticulosis was noted in the sigmoid colon.   The colon was otherwise normal.  There was no inflammation, polyps or cancers unless previously stated. Retroflexed views revealed internal hemorrhoids. The time to cecum=2 minutes 37 seconds.  Withdrawal time=12 minutes 49 seconds. The scope was withdrawn and the procedure completed. COMPLICATIONS: There were no complications.  ENDOSCOPIC IMPRESSION: 1.   Moderate diverticulosis was noted in the sigmoid colon 2.   The colon was otherwise normal  RECOMMENDATIONS: 1. Continue current colorectal screening recommendations for "routine risk" patients with a repeat  colonoscopy in 10 years.   eSigned:  Eustace Quail, MD 04/13/2014 9:14 AM   cc: The Patient, Noralee Space, MD, and Jani Gravel, MD

## 2014-04-13 NOTE — Patient Instructions (Signed)
YOU HAD AN ENDOSCOPIC PROCEDURE TODAY AT THE [facility]: Refer to the procedure report that was given to you for any specific questions about what was found during the examination.  If the procedure report does not answer your questions, please call your gastroenterologist to clarify.  If you requested that your care partner not be given the details of your procedure findings, then the procedure report has been included in a sealed envelope for you to review at your convenience later.  YOU SHOULD EXPECT: Some feelings of bloating in the abdomen. Passage of more gas than usual.  Walking can help get rid of the air that was put into your GI tract during the procedure and reduce the bloating. If you had a lower endoscopy (such as a colonoscopy or flexible sigmoidoscopy) you may notice spotting of blood in your stool or on the toilet paper. If you underwent a bowel prep for your procedure, then you may not have a normal bowel movement for a few days.  DIET: Your first meal following the procedure should be a light meal and then it is ok to progress to your normal diet.  A half-sandwich or bowl of soup is an example of a good first meal.  Heavy or fried foods are harder to digest and may make you feel nauseous or bloated.  Likewise meals heavy in dairy and vegetables can cause extra gas to form and this can also increase the bloating.  Drink plenty of fluids but you should avoid alcoholic beverages for 24 hours.  ACTIVITY: Your care partner should take you home directly after the procedure.  You should plan to take it easy, moving slowly for the rest of the day.  You can resume normal activity the day after the procedure however you should NOT DRIVE or use heavy machinery for 24 hours (because of the sedation medicines used during the test).    SYMPTOMS TO REPORT IMMEDIATELY: A gastroenterologist can be reached at any hour.  During normal business hours, 8:30 AM to 5:00 PM Monday through Friday,  call [phone].  After hours and on weekends, please call the GI answering service at [phone] who will take a message and have the physician on call contact you.   Following lower endoscopy (colonoscopy or flexible sigmoidoscopy):  Excessive amounts of blood in the stool  Significant tenderness or worsening of abdominal pains  Swelling of the abdomen that is new, acute  Fever of 100F or higher   FOLLOW UP: Our staff will call the home number listed on your records the next business day following your procedure to check on you and address any questions or concerns that you may have at that time regarding the information given to you following your procedure. This is a courtesy call and so if there is no answer at the home number and we have not heard from you through the emergency physician on call, we will assume that you have returned to your regular daily activities without incident.  SIGNATURES/CONFIDENTIALITY: You and/or your care partner have signed paperwork which will be entered into your electronic medical record.  These signatures attest to the fact that that the information above on your After Visit Summary has been reviewed and is understood.  Full responsibility of the confidentiality of this discharge information lies with you and/or your care-partner.  Continue your normal medications  Next colonoscopy in 10 years  Please read over handouts about diverticulosis and high fiber diets 

## 2014-04-14 ENCOUNTER — Telehealth: Payer: Self-pay | Admitting: *Deleted

## 2014-04-14 NOTE — Telephone Encounter (Signed)
  Follow up Call-  Call back number 04/13/2014  Post procedure Call Back phone  # [phone]  Permission to leave phone message Yes     Patient questions:  Message left to call us if necessary.

## 2014-09-29 ENCOUNTER — Ambulatory Visit: Payer: BC Managed Care – PPO | Admitting: Pulmonary Disease

## 2014-11-25 HISTORY — PX: MOHS SURGERY: SHX181

## 2014-12-01 ENCOUNTER — Ambulatory Visit: Payer: BC Managed Care – PPO | Admitting: Pulmonary Disease

## 2014-12-29 ENCOUNTER — Ambulatory Visit (INDEPENDENT_AMBULATORY_CARE_PROVIDER_SITE_OTHER): Payer: BC Managed Care – PPO | Admitting: Pulmonary Disease

## 2014-12-29 ENCOUNTER — Ambulatory Visit (INDEPENDENT_AMBULATORY_CARE_PROVIDER_SITE_OTHER)
Admission: RE | Admit: 2014-12-29 | Discharge: 2014-12-29 | Disposition: A | Discharge status: Home / Self Care | Payer: BC Managed Care – PPO | Source: Ambulatory Visit | Attending: Pulmonary Disease | Admitting: Pulmonary Disease

## 2014-12-29 ENCOUNTER — Encounter: Payer: Self-pay | Admitting: Pulmonary Disease

## 2014-12-29 VITALS — BP 130/100 | HR 68 | Temp 97.4°F | Ht 67.0 in | Wt 240.2 lb

## 2014-12-29 DIAGNOSIS — E663 Overweight: Secondary | ICD-10-CM

## 2014-12-29 DIAGNOSIS — Q332 Sequestration of lung: Secondary | ICD-10-CM

## 2014-12-29 DIAGNOSIS — M069 Rheumatoid arthritis, unspecified: Secondary | ICD-10-CM

## 2014-12-29 DIAGNOSIS — K219 Gastro-esophageal reflux disease without esophagitis: Secondary | ICD-10-CM

## 2014-12-29 MED ORDER — OMEPRAZOLE MAGNESIUM 20 MG PO TBEC: 20.0000 mg | DELAYED_RELEASE_TABLET | Freq: Every day | ORAL | Status: DC

## 2014-12-29 MED ORDER — HYDROCORTISONE 2.5 % RE CREA: 1.0000 "application " | TOPICAL_CREAM | Freq: Two times a day (BID) | RECTAL | Status: DC

## 2014-12-29 NOTE — Patient Instructions (Signed)
Today we updated your med list in our EPIC system...    Continue your current medications the same...  Today we rechecked your yearly f/u CXR...    We will contact you w/ the results when available...   Call for any questions or if we can be of service in any way...  Let's plan a follow up visit in 72yr, sooner if needed for respiratory problems.Marland KitchenMarland Kitchen

## 2015-01-01 ENCOUNTER — Encounter: Payer: Self-pay | Admitting: Pulmonary Disease

## 2015-01-01 NOTE — Progress Notes (Signed)
Subjective:     Patient ID: Nathaniel [name], male   DOB: 1951/02/01, 63 y.o.   MRN: [id number]  HPI 4 y/o WF, son of Lib & Jaxn Chiquito (now deceased), here for a follow up visit... 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 he has a hx of RA on MTX & [name]- followed by Glean Salen every 32mo... ~  SEE PREV EPIC NOTES FOR OLDER DATA >>   ~  January 21, 2013:  72month ROV & CPX> Kethan continues his regular f/u w/ DrBeekman for Rheum- on [name] & MTX; we do not have any recent notes from Rheum & we have called for these but so far we have not received any records; He indicates that he gets lab work every 73mo as well & he is concerned about a report of elev LFTs and DrBeekman requested an Abd Ultrasound- pending (pt is concerned about his insurance coverage)...  We reviewed the following medical problems during today's office visit >>     AR> he uses OTC antihis as needed...    Snoring> Ferdinando mentioned snoring as a problem (denies daytime hypersomnolence) & we discussed the work-up & offered sleep study when he is ready (he doesn't want it now because he thinks the CPAP will scare his cat)...    Overweight> his weight remains similar to several yrs ago- weight = 238# & we reviewed diet, exercise, wt reduction strategies...    GERD> he has not had GI eval but notes occas epis of heartburn related to food intake but no abd pain, dysphagia, n/v, etc...    Need for colonoscopy> he has never had a screening colonoscopy & is advised to set up this important screening procedure...    Elev LFTs> he is concerned over recent report of elev LFTs from [facility]; they have ordered an AbdUltrasound but pt is reluctant due to cost; repeat labs here were WNL x SGOT=38 (nl 0-37) and he is reassured; he denies abd pain, n/v, c/d, blood seen; ultrasounds are no longer done "in house" by our GI division & we will sched it to be done out pt=> fatty liver (needs to lose wt) & ?sm cysts w/ MRI suggested (pt  never had this done)...    RA> he remains under the care of [place] for Rheum- on [name] & MTX, +Folate & VitD, we have requested recent notes to review...    Anxiety> he was prev on Alpraz0.5 but he states it gave him HAs; requests diff anxiolytic Rx & we chose Chlorazepate 7.5mg  prn...    Skin lesions> he has mult SKs & is referred to Derm to consider excision as necessary... We reviewed prob list, meds, xrays and labs> see below for updates >>   LABS 2/14:  FLP- at goals on diet alone x LDL=106;  Chem- wnl x SGOT=38;  CBC- wnl;  TSH=1.67;  PSA=0.48  Abd Sonar => done 3/14 & showed incr liver echogenicity suggesting fatty infiltration, sm hypoechoic lesions (not clearly cysts), 1.2cm complex cyst, normal GB/ ducts/ pancreas/ spleen/ kidneys (they rec MRI=> pt never had this done)...   ~  February 18, 2014:  60mo ROV & CPX> Nathaniel [name] continues his regular follow up w/ DrBeekman for his RA on [name] & MTX, they do regular labs, last note from them was 7/14- note reviewed, CCP+ RA w/ right wrist synovitis (injected), erosions on XRay, cervicalgia, LFTs reported to have normalized... He had right V1 shingles 1/15 treated by TP w/ Famvir &  Medrol and resolved (transient Lyrica for shingles pain)...     AR> he uses OTC antihis as needed...    Snoring> Nathaniel [name] mentioned snoring as a problem (denies daytime hypersomnolence) & we discussed work-up & offered sleep study when he is ready (he doesn't want it because he thinks the CPAP will scare his cat).....    GERD> on Prilosec20; he has not had GI eval but notes occas epis of heartburn related to food intake but no abd pain, dysphagia, n/v, etc...    Need for colonoscopy> he has never had a screening colonoscopy & is advised to set up this important screening procedure=> referred; he uses [place] cream for hems as needed...    Elev LFTs> LFTs were min elev per DrBeekman & AbdSonar 3/14 showed incr liver echo texture c/w FLD & ?sm hypoechoic lesions w/ MRI suggested;  he has not yet had this done; Labs 3/15 are worse w/ GOT=78 & GPT=91 assoc w/ wt gain to 251# (BMI=40); we reviewed need for wt reduction & will sched his MRI liver=> pending.    Obesity>his weight is up to 251# w/ assoc worsening LFTs (fatty liver dis) & we reviewed diet, exercise, wt reduction strategies. he has not taken wt reduction seriously- we reviewed FLD & imperative for wt reduction, no Etoh or hepatotoxins, etc...    RA> he remains under the care of [facility] for Rheum- on [name] & MTX, +Folate & VitD, they continue to do labs every 3-71mo etc...    Anxiety> he was prev on Alpraz0.5 but he states it gave him HAs; subseq tried Chlorazepate 7.5mg  prn=> off now & requests Ambien10 prn sleep...    Skin lesions> he has mult SKs & is referred to Derm to consider excision as necessary... We reviewed prob list, meds, xrays and labs> see below for updates >>   LABS 3/15:  FLP- ok on diet;  Chems- wnl x LFTs (GOT=78 GPT=91);  CBC- wnl;  TSH- 1.70;  PSA=0.50  CXR 3/15 showed borderline heart size, mild apical pleural thickening c/w scarring, mild basilar atx. DJD in Tspine & shoulders, NAD.Marland KitchenMarland Kitchen   EKG 3/15 showed NSR, rate63, WNL, NAD...  MRI Liver => pending  ~  December 29, 2014:  36mo ROV & Nathaniel [name] has seen DrKim at [facility] for Primary care; he sees Catering manager for Rheum; he sees DrLupton for [facility] he uses OTC antihis & Flonase as needed...    Snoring> Nathaniel [name] mentioned snoring as a problem (denies daytime hypersomnolence) & we discussed work-up & offered sleep study when he is ready (he doesn't want it because he thinks the CPAP will scare his cat).....    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    GERD> on Prilosec20; he has not had GI eval but notes occas epis of heartburn related to food intake but no abd pain, dysphagia, n/v,  etc...    Diverticulosis> he uses [place] cream for hems as needed; Colonoscopy 5/15 by DrPerry showed divertics, otherw neg, f/u planned 10 yrs.     Elev LFTs> LFTs were min elev per DrBeekman & AbdSonar 3/14 showed incr liver echo texture c/w FLD & ?sm hypoechoic lesions; Labs 3/15 are worse w/ GOT=78 & GPT=91 assoc w/ wt gain to 251# (BMI=40); MRI liver=> done 4/15 (diffuse hep steatosis, mult tiny liver cysts bilat, otherw neg, shotty reactive LNs,  2.5cm RLL pulm nodule=> see CT Chest); he has lost 11# down to 240# Feb2016...    Obesity> weight was up to 251# w/ assoc worsening LFTs (fatty liver dis) & we reviewed diet, exercise, wt reduction strategies; weight down 11# to 240# by [id number] & he is encouraged to keep going...    RA> he remains under the care of [facility] for Rheum- on [name] & MTX, +Folate & VitD, they continue to do labs every 3-50mo etc...    Anxiety> he was prev on Alpraz0.5 but he states it gave him HAs; subseq tried Chlorazepate 7.5mg  prn=> off now & requests Ambien10 prn sleep...    Skin lesions, Melanoma> he has mult SKs & was referred to Derm=> mole removed from left post shoulder was melanoma, subseq wider excision & he continues to see DrLupton Q90mo... We reviewed prob list, meds, xrays and labs> see below for updates >>   CXR 2/16 showed norm heart size, clear lungs, NAD...           Problem List:   ALLERGIC RHINITIS, SEASONAL (ICD-477.0) - he uses OTC antihist Prn...   SNORING >> Huckleberry mentioned snoring as a problem 2/14 (denies daytime hypersomnolence) & we discussed the work-up & offered sleep study when he is ready (he doesn't want it now because he thinks the CPAP will scare his cat)...  ABNORMAL CT CHEST, PULMONARY SEQUESTRATION >>  ~  CXR 3/15 showed borderline cardiomeg, poor inspir w/ basilar atx, DJD spine & shoulders, NAD...  ~  MRI Abd 4/15 showed diffuse hep steatosis, mult tiny liver cysts bilat, otherw neg, shotty reactive LNs, 2.5cm RLL pulm nodule  (post-medial)=> needs CT Chest.  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 ~  CXR 2/16 showed norm heart size, clear lungs, NAD...   OVERWEIGHT (ICD-278.02) - we discussed diet + exercise program suffic to lose weight. ~  Weight 11/11 = 231#,  5\' 8"  tall,  BMI= 35 ~  Weight 2/14 = 238# ~  Weight 3/15 = 251# and we reviewed the imperative to lose the weight due to FLD & snoring issues... ~  Weight 2/16 = 240#  GERD >> he has not had GI eval but notes occas epis of heartburn related to food intake, on Prilosec20, denies abd pain, dysphagia, n/v, etc...  DIVERTICULOSIS >>  ~  3/15: he has never had a screening colonoscopy & is advised to set up this important screening procedure ASAP (referred to [facility]).Marland Kitchen. ~  5/15: Colonoscopy by DrPerry showed mod divertics in sigmoid, otherw neg... rec f/u in 10 yrs.  Elev LFTs >> he mentioned report of elev LFTs from [facility]; repeat labs here 2/14 were WNL x SGOT=38 (nl 0-37); he denies abd pain, n/v, c/d, blood seen... ~  Abd Sonar 3/14 showed  incr liver echogenicity suggesting fatty infiltration, sm hypoechoic lesions (not clearly cysts), 1.2cm complex cyst, normal GB/ ducts/ pancreas/ spleen/ kidneys (they rec MRI=> pt never had this done)... ~  Labs 3/15 showed GOT=78 & GPT=91 assoc w/ wt gain to 251# (BMI=40); we reviewed need for wt reduction & will sched his MRI liver=> pending. ~  MRI Abd 4/15 showed diffuse hep steatosis, mult tiny liver cysts bilat, otherw neg, shotty reactive LNs, 2.5cm RLL pulm nodule=> needs CT Chest... ~  LFTs followed by Glean Salen & DrKim...  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 he is doing extremely well- asymptomatic & no stigmata of RA.. ~  2/14: he remains under the care of [facility] for Rheum- on [name] & MTX, +Folate & VitD, we have requested recent notes to review... ~  3/15: he remains under the care of [facility] for Rheum- on [name] & MTX, +Folate & VitD, they do regular labs, last note from them was 7/14- reviewed, CCP+ RA w/ right wrist synovitis (injected), erosions on XRay, cervicalgia, LFTs reported to have normalized. ~  2016> he continues under the care of DrBeekman on [name] & MTX; seen Q62mo w/ regular blood work etc...   ANXIETY (ICD-300.00) - he has some mild anxiety related to his work & RA diagnosis & Rx... we discussed trial ALPRAZOLAM 0.5mg - 1/2 to 1 tab Tid Prn... ~  2/14:  he was prev on Alpraz0.5 but he states it gave him HAs; requests diff anxiolytic Rx & we chose Chlorazepate 7.5mg  prn... ~  3/15:  No longer taking the Clorazepate, requesting Zolpidem 10mg  for sleep prn...  Health Maintenance: ~  GI:  no signif GI hx & he has never had a colonoscopy> we will refer to GI for this important screening procedure... ~  GU:  he had DRE (neg) and PSAs have all been WNL... ~  Immunizations:  he is on the immunosuppressives=> he had Pneumovax 9/10, TDAP 10/13, and yearly flu vaccine in the fall...   Past Surgical History  Procedure Laterality Date  . Hemorroidectomy      Dr. Deon Pilling  . Mohs surgery  11/2014    Outpatient Encounter Prescriptions as of 12/29/2014  Medication Sig  . adalimumab ([name] PEN) 40 MG/0.8ML injection Inject 40 mg into the skin every 14 (fourteen) days.   . cholecalciferol (VITAMIN D) 1000 UNITS tablet Take 1,000 Units by mouth daily.    . fluticasone (FLONASE) 50 MCG/ACT nasal spray   . folic acid (FOLVITE) 1 MG tablet Take 1 mg by mouth daily.    . methotrexate (RHEUMATREX) 2.5 MG tablet 2  tabs by mouth once weekly  . omeprazole (PRILOSEC OTC) 20 MG tablet Take 1 tablet (20 mg total) by mouth daily before  breakfast.  . traMADol-acetaminophen (ULTRACET) 37.5-325 MG per tablet Take 1 tablet by mouth every 6 (six) hours as needed.  . [DISCONTINUED] hydrocortisone (ANUSOL-HC) 2.5 % rectal cream Place 1 application rectally 2 (two) times daily.  . [DISCONTINUED] omeprazole (PRILOSEC OTC) 20 MG tablet Take 1 tablet (20 mg total) by mouth daily before breakfast.  . hydrocortisone (PROCTOSOL HC) 2.5 % rectal cream Place 1 application rectally 2 (two) times daily.  . [DISCONTINUED] amoxicillin-clavulanate (AUGMENTIN) 500-125 MG per tablet   . [DISCONTINUED] predniSONE (DELTASONE) 5 MG tablet Take 5 mg by mouth as directed.    No Known Allergies   Current Medications, Allergies, Past Medical History, Past Surgical History, Family History, and Social History were reviewed in [employer] record.   Review of Systems         The patient complains of dyspnea on exertion, arthritis, anxiety, and hay fever.  The patient denies fever, chills, sweats, anorexia, fatigue, weakness, malaise, weight loss, sleep disorder, blurring, diplopia, eye irritation, eye discharge, vision loss, eye pain, photophobia, earache, ear discharge, tinnitus, decreased hearing, nasal congestion, nosebleeds, sore throat, hoarseness, chest pain, palpitations, syncope, orthopnea, PND, peripheral edema, cough, dyspnea at  rest, excessive sputum, hemoptysis, wheezing, pleurisy, nausea, vomiting, diarrhea, constipation, change in bowel habits, abdominal pain, melena, hematochezia, jaundice, gas/bloating, indigestion/heartburn, dysphagia, odynophagia, dysuria, hematuria, urinary frequency, urinary hesitancy, nocturia, incontinence, back pain, joint pain, joint swelling, muscle cramps, muscle weakness, stiffness, sciatica, restless legs, leg pain at night, leg pain with exertion, rash, itching, dryness, suspicious lesions, paralysis, paresthesias, seizures, tremors, vertigo, transient blindness, frequent falls, frequent  headaches, difficulty walking, depression, memory loss, confusion, cold intolerance, heat intolerance, polydipsia, polyphagia, polyuria, unusual weight change, abnormal bruising, bleeding, enlarged lymph nodes, urticaria, allergic rash, and recurrent infections.     Objective:   Physical Exam      WD, overweight, 64 y/o WM in NAD... GENERAL:  Alert & oriented; pleasant & cooperative... HEENT:  [place]/AT, EOM-wnl, PERRLA, Fundi-benign, EACs-clear, TMs-wnl, NOSE-clear, THROAT-clear & wnl. NECK:  Supple w/ full ROM; no JVD; normal carotid impulses w/o bruits; no thyromegaly or nodules palpated; no lymphadenopathy. CHEST:  Clear to P & A; without wheezes/ rales/ or rhonchi. HEART:  Regular Rhythm; without murmurs/ rubs/ or gallops. ABDOMEN:  Soft & nontender; normal bowel sounds; no organomegaly or masses detected. RECTAL:  Neg - prostate 2+ & nontender w/o nodules; stool hematest neg. EXT: without deformities or arthritic changes; no active synovitis; no varicose veins/ venous insuffic/ or edema. NEURO:  CN's intact; motor testing normal; sensory testing normal; gait normal & balance OK. DERM:  Mult SKs noted...  RADIOLOGY DATA:  Reviewed in the EPIC EMR & discussed w/ the patient...  LABORATORY DATA:  Reviewed in the EPIC EMR & discussed w/ the patient...   Assessment:      PULMONARY SEQUESTRATION>> this was seen on MRI Abd, then CT Chest 4/15 as an incidental finding; he remains asymptomatic...  AR/ Snoring>  Offered sleep study but he declines at this time & will let me know when he is ready to proceed...  Overweight>  We reviewed diet, exercise, wt reduction strategies...  GI- GERD, needs colonoscopy, elev LFTs>  We reviewed Prilosec vs Pepcid OTC for reflux symptoms; he needs to sched screening colon; Sonar w/ FLD & sm hypoechoic lesions=> needs MRI for further eval...  RA>  He remains on [name] & low dose MTX from [place]...  Anxiety, Insomnia>   He requests Ambien 10mg  to use as  needed for sleep...  Skin Lesions>  Mult SKs could be removed by Derm at his request, he will call Dr?Hall; had right V1 shingles 1/15 treated & resolved...Marland KitchenMarland KitchenMarland Kitchen     Plan:     Patient's Medications  New Prescriptions   HYDROCORTISONE (PROCTOSOL HC) 2.5 % RECTAL CREAM    Place 1 application rectally 2 (two) times daily.  Previous Medications   ADALIMUMAB ([name] PEN) 40 MG/0.8ML INJECTION    Inject 40 mg into the skin every 14 (fourteen) days.    CHOLECALCIFEROL (VITAMIN D) 1000 UNITS TABLET    Take 1,000 Units by mouth daily.     FLUTICASONE (FLONASE) 50 MCG/ACT NASAL SPRAY       FOLIC ACID (FOLVITE) 1 MG TABLET    Take 1 mg by mouth daily.     METHOTREXATE (RHEUMATREX) 2.5 MG TABLET    2  tabs by mouth once weekly   TRAMADOL-ACETAMINOPHEN (ULTRACET) 37.5-325 MG PER TABLET    Take 1 tablet by mouth every 6 (six) hours as needed.  Modified Medications   Modified Medication Previous Medication   OMEPRAZOLE (PRILOSEC OTC) 20 MG TABLET omeprazole (PRILOSEC OTC) 20 MG tablet      Take 1 tablet (  20 mg total) by mouth daily before breakfast.    Take 1 tablet (20 mg total) by mouth daily before breakfast.  Discontinued Medications   AMOXICILLIN-CLAVULANATE (AUGMENTIN) 500-125 MG PER TABLET       HYDROCORTISONE (ANUSOL-HC) 2.5 % RECTAL CREAM    Place 1 application rectally 2 (two) times daily.   PREDNISONE (DELTASONE) 5 MG TABLET    Take 5 mg by mouth as directed.

## 2015-02-21 ENCOUNTER — Other Ambulatory Visit: Payer: Self-pay | Admitting: Internal Medicine

## 2015-02-21 DIAGNOSIS — R945 Abnormal results of liver function studies: Secondary | ICD-10-CM

## 2015-03-01 ENCOUNTER — Other Ambulatory Visit: Payer: BC Managed Care – PPO

## 2015-03-23 ENCOUNTER — Encounter: Payer: Self-pay | Admitting: Internal Medicine

## 2015-03-23 ENCOUNTER — Ambulatory Visit (INDEPENDENT_AMBULATORY_CARE_PROVIDER_SITE_OTHER): Payer: BC Managed Care – PPO | Admitting: Internal Medicine

## 2015-03-23 ENCOUNTER — Other Ambulatory Visit (INDEPENDENT_AMBULATORY_CARE_PROVIDER_SITE_OTHER): Payer: BC Managed Care – PPO

## 2015-03-23 ENCOUNTER — Ambulatory Visit: Payer: BC Managed Care – PPO | Admitting: Internal Medicine

## 2015-03-23 VITALS — BP 136/84 | HR 78 | Ht 65.75 in | Wt 239.2 lb

## 2015-03-23 DIAGNOSIS — K760 Fatty (change of) liver, not elsewhere classified: Secondary | ICD-10-CM | POA: Diagnosis not present

## 2015-03-23 DIAGNOSIS — R7989 Other specified abnormal findings of blood chemistry: Secondary | ICD-10-CM

## 2015-03-23 DIAGNOSIS — R932 Abnormal findings on diagnostic imaging of liver and biliary tract: Secondary | ICD-10-CM

## 2015-03-23 DIAGNOSIS — R945 Abnormal results of liver function studies: Secondary | ICD-10-CM

## 2015-03-23 LAB — IBC PANEL
Iron: 54 ug/dL (ref 42–165)
Saturation Ratios: 15.4 % — ABNORMAL LOW (ref 20.0–50.0)
TRANSFERRIN: 250 mg/dL (ref 212.0–360.0)

## 2015-03-23 LAB — PROTIME-INR
INR: 1.1 ratio — ABNORMAL HIGH (ref 0.8–1.0)
PROTHROMBIN TIME: 12.7 s (ref 9.6–13.1)

## 2015-03-23 LAB — FERRITIN: FERRITIN: 251.4 ng/mL (ref 22.0–322.0)

## 2015-03-23 LAB — IRON: IRON: 54 ug/dL (ref 42–165)
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
## 2015-03-23 NOTE — Patient Instructions (Signed)
Your physician has requested that you go to the basement for lab work before leaving today   Please follow up with Dr. Henrene Pastor in 6 months

## 2015-03-24 LAB — HEPATITIS B SURFACE ANTIGEN: Hepatitis B Surface Ag: NEGATIVE

## 2015-03-24 LAB — HEPATITIS B SURFACE ANTIBODY,QUALITATIVE: Hep B S Ab: NEGATIVE

## 2015-03-30 LAB — HCV RNA NAA QUAL RFX TO QUANT: HCV RNA NAA QUALITATIVE: NEGATIVE

## 2017-03-17 ENCOUNTER — Ambulatory Visit (INDEPENDENT_AMBULATORY_CARE_PROVIDER_SITE_OTHER)
Admission: RE | Admit: 2017-03-17 | Discharge: 2017-03-17 | Disposition: A | Discharge status: Home / Self Care | Payer: BC Managed Care – PPO | Source: Ambulatory Visit | Attending: Pulmonary Disease | Admitting: Pulmonary Disease

## 2017-03-17 ENCOUNTER — Ambulatory Visit (INDEPENDENT_AMBULATORY_CARE_PROVIDER_SITE_OTHER): Payer: BC Managed Care – PPO | Admitting: Pulmonary Disease

## 2017-03-17 ENCOUNTER — Encounter: Payer: Self-pay | Admitting: Pulmonary Disease

## 2017-03-17 VITALS — BP 130/72 | HR 81 | Temp 97.3°F | Ht 67.0 in | Wt 233.5 lb

## 2017-03-17 DIAGNOSIS — R0600 Dyspnea, unspecified: Secondary | ICD-10-CM

## 2017-03-17 DIAGNOSIS — Z9989 Dependence on other enabling machines and devices: Secondary | ICD-10-CM

## 2017-03-17 DIAGNOSIS — F410 Panic disorder [episodic paroxysmal anxiety] without agoraphobia: Secondary | ICD-10-CM

## 2017-03-17 DIAGNOSIS — E663 Overweight: Secondary | ICD-10-CM

## 2017-03-17 DIAGNOSIS — M069 Rheumatoid arthritis, unspecified: Secondary | ICD-10-CM

## 2017-03-17 DIAGNOSIS — Q332 Sequestration of lung: Secondary | ICD-10-CM

## 2017-03-17 DIAGNOSIS — G4733 Obstructive sleep apnea (adult) (pediatric): Secondary | ICD-10-CM

## 2017-03-17 MED ORDER — CLONAZEPAM 0.5 MG PO TABS: 0.5000 mg | ORAL_TABLET | Freq: Two times a day (BID) | ORAL | 5 refills | Status: DC | PRN

## 2017-03-17 MED ORDER — FLUTICASONE PROPIONATE 50 MCG/ACT NA SUSP: 2.0000 | Freq: Two times a day (BID) | NASAL | 11 refills | Status: DC
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
## 2017-03-17 NOTE — Patient Instructions (Signed)
Today we updated your med list in our EPIC system...    Continue your current medications the same...  For your shortness of breath episodes>    Start the new KLONOPIN 0.5mg  one tab twice daily...  We also refilled the [facility] you requested- 2 sprays in each nostril once or twice daily...  To clean your CPAP mask & tubing-- sonsider the "So Clean" system... You can find this on line or get it from [facility] (they can cut you a deal)....  Today we checked a follow up CXR...    We will contact you w/ the results when available...   Call for any questions...  Let's plan a brief follow up visit in about 6 weeks, sooner if needed for problems.Marland KitchenMarland Kitchen

## 2017-03-21 ENCOUNTER — Telehealth: Payer: Self-pay | Admitting: Pulmonary Disease

## 2017-03-21 NOTE — Telephone Encounter (Signed)
Pt was called about his lab results.  He stated that he and his wife spoke about the klonopin and they are concerned about his using this.    Per SN--  Klonopin would be the best for this, but if the pt wants to change then we can call in lorazepam 1 mg  #90  1/2 to 1 TID prn as directed.   Called and spoke with the pt and he stated that he will speak with his wife over the weekend and he will call us back on Monday.

## 2017-03-27 ENCOUNTER — Telehealth: Payer: Self-pay | Admitting: Pulmonary Disease

## 2017-03-27 NOTE — Telephone Encounter (Signed)
Klonopin would be the best for this, but if the pt wants to change then we can call in lorazepam 1 mg  #90  1/2 to 1 TID prn as directed.   Called and spoke with the pt and he stated that he will speak with his wife over the weekend and he will call us back on Monday.     Called and lmomtcb x 1 for the pt to find out if he wanted to try this med

## 2017-03-31 NOTE — Telephone Encounter (Signed)
Spoke with pt and he decided to hold off on any medications at this time. He stated he will call us back if he needs anything further. Nothing further is needed

## 2017-04-16 ENCOUNTER — Encounter: Payer: Self-pay | Admitting: Pulmonary Disease

## 2017-04-16 NOTE — Telephone Encounter (Signed)
Dr Lenna Gilford,  I hope all is well. Thank you for all of your assistance and help that you have given me and may family for many years You are very professional and your staff too.  Just to let you know I received the prescribedmed ClonazelPAM 0.5 and only take it on occasional bases when needed. It has really helped me in my breathing issues and sleeping with the full face mask with the CPAP. equipment  Can I change my upcoming appointment for later since I was only been using med. for short time and not on daily bases  Hope you have a restful Memorial Weekend  Art Levan cell [phone]

## 2017-04-28 ENCOUNTER — Ambulatory Visit: Payer: BC Managed Care – PPO | Admitting: Pulmonary Disease

## 2019-09-13 ENCOUNTER — Inpatient Hospital Stay (HOSPITAL_COMMUNITY)
Admission: EM | Admit: 2019-09-13 | Discharge: 2019-09-19 | DRG: 392 | Disposition: A | Discharge status: Home / Self Care | Payer: BC Managed Care – PPO | Source: Ambulatory Visit | Attending: Internal Medicine | Admitting: Internal Medicine

## 2019-09-13 ENCOUNTER — Other Ambulatory Visit: Payer: Self-pay

## 2019-09-13 ENCOUNTER — Encounter (HOSPITAL_COMMUNITY): Payer: Self-pay | Admitting: Emergency Medicine

## 2019-09-13 ENCOUNTER — Emergency Department (HOSPITAL_COMMUNITY): Payer: BC Managed Care – PPO

## 2019-09-13 DIAGNOSIS — D6489 Other specified anemias: Secondary | ICD-10-CM | POA: Diagnosis present

## 2019-09-13 DIAGNOSIS — E669 Obesity, unspecified: Secondary | ICD-10-CM | POA: Diagnosis present

## 2019-09-13 DIAGNOSIS — Z6836 Body mass index (BMI) 36.0-36.9, adult: Secondary | ICD-10-CM

## 2019-09-13 DIAGNOSIS — Z79899 Other long term (current) drug therapy: Secondary | ICD-10-CM

## 2019-09-13 DIAGNOSIS — I10 Essential (primary) hypertension: Secondary | ICD-10-CM | POA: Diagnosis present

## 2019-09-13 DIAGNOSIS — T394X5A Adverse effect of antirheumatics, not elsewhere classified, initial encounter: Secondary | ICD-10-CM | POA: Diagnosis present

## 2019-09-13 DIAGNOSIS — G4733 Obstructive sleep apnea (adult) (pediatric): Secondary | ICD-10-CM | POA: Diagnosis present

## 2019-09-13 DIAGNOSIS — E162 Hypoglycemia, unspecified: Secondary | ICD-10-CM | POA: Diagnosis present

## 2019-09-13 DIAGNOSIS — Z85820 Personal history of malignant melanoma of skin: Secondary | ICD-10-CM

## 2019-09-13 DIAGNOSIS — K219 Gastro-esophageal reflux disease without esophagitis: Secondary | ICD-10-CM | POA: Diagnosis present

## 2019-09-13 DIAGNOSIS — R3129 Other microscopic hematuria: Secondary | ICD-10-CM | POA: Diagnosis present

## 2019-09-13 DIAGNOSIS — R911 Solitary pulmonary nodule: Secondary | ICD-10-CM | POA: Diagnosis present

## 2019-09-13 DIAGNOSIS — Z8249 Family history of ischemic heart disease and other diseases of the circulatory system: Secondary | ICD-10-CM | POA: Diagnosis not present

## 2019-09-13 DIAGNOSIS — D84821 Immunodeficiency due to drugs: Secondary | ICD-10-CM | POA: Diagnosis present

## 2019-09-13 DIAGNOSIS — K5720 Diverticulitis of large intestine with perforation and abscess without bleeding: Secondary | ICD-10-CM | POA: Diagnosis present

## 2019-09-13 DIAGNOSIS — Z803 Family history of malignant neoplasm of breast: Secondary | ICD-10-CM | POA: Diagnosis not present

## 2019-09-13 DIAGNOSIS — Z20828 Contact with and (suspected) exposure to other viral communicable diseases: Secondary | ICD-10-CM | POA: Diagnosis present

## 2019-09-13 DIAGNOSIS — M069 Rheumatoid arthritis, unspecified: Secondary | ICD-10-CM | POA: Diagnosis present

## 2019-09-13 DIAGNOSIS — K5740 Diverticulitis of both small and large intestine with perforation and abscess without bleeding: Secondary | ICD-10-CM

## 2019-09-13 LAB — URINALYSIS, ROUTINE W REFLEX MICROSCOPIC
Bilirubin Urine: NEGATIVE
Glucose, UA: NEGATIVE mg/dL
Ketones, ur: 20 mg/dL — AB
Leukocytes,Ua: NEGATIVE
Nitrite: NEGATIVE
Protein, ur: 100 mg/dL — AB
Specific Gravity, Urine: 1.03 (ref 1.005–1.030)
pH: 5 (ref 5.0–8.0)

## 2019-09-13 LAB — CBC WITH DIFFERENTIAL/PLATELET
Abs Immature Granulocytes: 0.05 10*3/uL (ref 0.00–0.07)
Basophils Absolute: 0 10*3/uL (ref 0.0–0.1)
Basophils Relative: 0 %
Eosinophils Absolute: 0 10*3/uL (ref 0.0–0.5)
Eosinophils Relative: 0 %
HCT: 40.7 % (ref 39.0–52.0)
Hemoglobin: 13 g/dL (ref 13.0–17.0)
Immature Granulocytes: 0 %
Lymphocytes Relative: 11 %
Lymphs Abs: 1.4 10*3/uL (ref 0.7–4.0)
MCH: 29.3 pg (ref 26.0–34.0)
MCHC: 31.9 g/dL (ref 30.0–36.0)
MCV: 91.7 fL (ref 80.0–100.0)
Monocytes Absolute: 1 10*3/uL (ref 0.1–1.0)
Monocytes Relative: 7 %
Neutro Abs: 10.7 10*3/uL — ABNORMAL HIGH (ref 1.7–7.7)
Neutrophils Relative %: 82 %
Platelets: 391 10*3/uL (ref 150–400)
RBC: 4.44 MIL/uL (ref 4.22–5.81)
RDW: 12 % (ref 11.5–15.5)
WBC: 13.1 10*3/uL — ABNORMAL HIGH (ref 4.0–10.5)
nRBC: 0 % (ref 0.0–0.2)

## 2019-09-13 LAB — COMPREHENSIVE METABOLIC PANEL
ALT: 26 U/L (ref 0–44)
AST: 27 U/L (ref 15–41)
Albumin: 3.3 g/dL — ABNORMAL LOW (ref 3.5–5.0)
Alkaline Phosphatase: 50 U/L (ref 38–126)
Anion gap: 12 (ref 5–15)
BUN: 18 mg/dL (ref 8–23)
CO2: 20 mmol/L — ABNORMAL LOW (ref 22–32)
Calcium: 9 mg/dL (ref 8.9–10.3)
Chloride: 105 mmol/L (ref 98–111)
Creatinine, Ser: 1 mg/dL (ref 0.61–1.24)
GFR calc Af Amer: >60 mL/min (ref >60)
GFR calc non Af Amer: >60 mL/min (ref >60)
Glucose, Bld: 101 mg/dL — ABNORMAL HIGH (ref 70–99)
Potassium: 4.1 mmol/L (ref 3.5–5.1)
Sodium: 137 mmol/L (ref 135–145)
Total Bilirubin: 0.7 mg/dL (ref 0.3–1.2)
Total Protein: 7.8 g/dL (ref 6.5–8.1)

## 2019-09-13 LAB — LIPASE, BLOOD: Lipase: 23 U/L (ref 11–51)

## 2019-09-13 MED ORDER — SODIUM CHLORIDE 0.9% FLUSH: 3.0000 mL | Freq: Once | INTRAVENOUS | Status: DC

## 2019-09-13 MED ORDER — PIPERACILLIN-TAZOBACTAM 3.375 G IVPB 30 MIN
3.3750 g | Freq: Once | INTRAVENOUS | Status: AC
  Administered 2019-09-13: 3.375 g via INTRAVENOUS
  Filled 2019-09-13: qty 50

## 2019-09-13 MED ORDER — IOHEXOL 300 MG/ML  SOLN
100.0000 mL | Freq: Once | INTRAMUSCULAR | Status: AC | PRN
  Administered 2019-09-13: 100 mL via INTRAVENOUS

## 2019-09-13 MED ORDER — SODIUM CHLORIDE (PF) 0.9 % IJ SOLN
INTRAMUSCULAR | Status: AC
  Filled 2019-09-13: qty 50
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
## 2019-09-13 NOTE — ED Notes (Signed)
Patient not in room. Patient has not been brought back by Triage.

## 2019-09-13 NOTE — ED Triage Notes (Signed)
Patient here from Dr Julianne Rice office with complaints of abd pain increased on left side, radiating to right side. Reports pain started 3 weeks ago. 6/10, sharp. Gas X and Lactulose with no relief.

## 2019-09-14 ENCOUNTER — Encounter (HOSPITAL_COMMUNITY): Payer: Self-pay | Admitting: Family Medicine

## 2019-09-14 ENCOUNTER — Other Ambulatory Visit: Payer: Self-pay

## 2019-09-14 DIAGNOSIS — I10 Essential (primary) hypertension: Secondary | ICD-10-CM | POA: Diagnosis present

## 2019-09-14 HISTORY — DX: Essential (primary) hypertension: I10

## 2019-09-14 LAB — CBC WITH DIFFERENTIAL/PLATELET
Abs Immature Granulocytes: 0.04 10*3/uL (ref 0.00–0.07)
Basophils Absolute: 0 10*3/uL (ref 0.0–0.1)
Basophils Relative: 0 %
Eosinophils Absolute: 0.1 10*3/uL (ref 0.0–0.5)
Eosinophils Relative: 1 %
HCT: 37 % — ABNORMAL LOW (ref 39.0–52.0)
Hemoglobin: 11.7 g/dL — ABNORMAL LOW (ref 13.0–17.0)
Immature Granulocytes: 0 %
Lymphocytes Relative: 25 %
Lymphs Abs: 2.4 10*3/uL (ref 0.7–4.0)
MCH: 29.9 pg (ref 26.0–34.0)
MCHC: 31.6 g/dL (ref 30.0–36.0)
MCV: 94.6 fL (ref 80.0–100.0)
Monocytes Absolute: 1 10*3/uL (ref 0.1–1.0)
Monocytes Relative: 10 %
Neutro Abs: 6.1 10*3/uL (ref 1.7–7.7)
Neutrophils Relative %: 64 %
Platelets: 320 10*3/uL (ref 150–400)
RBC: 3.91 MIL/uL — ABNORMAL LOW (ref 4.22–5.81)
RDW: 12.1 % (ref 11.5–15.5)
WBC: 9.6 10*3/uL (ref 4.0–10.5)
nRBC: 0 % (ref 0.0–0.2)

## 2019-09-14 LAB — SARS CORONAVIRUS 2 (TAT 6-24 HRS): SARS Coronavirus 2: NEGATIVE

## 2019-09-14 LAB — BASIC METABOLIC PANEL
Anion gap: 10 (ref 5–15)
BUN: 18 mg/dL (ref 8–23)
CO2: 23 mmol/L (ref 22–32)
Calcium: 8.5 mg/dL — ABNORMAL LOW (ref 8.9–10.3)
Chloride: 106 mmol/L (ref 98–111)
Creatinine, Ser: 1.06 mg/dL (ref 0.61–1.24)
GFR calc Af Amer: >60 mL/min (ref >60)
GFR calc non Af Amer: >60 mL/min (ref >60)
Glucose, Bld: 94 mg/dL (ref 70–99)
Potassium: 3.7 mmol/L (ref 3.5–5.1)
Sodium: 139 mmol/L (ref 135–145)

## 2019-09-14 LAB — HIV ANTIBODY (ROUTINE TESTING W REFLEX): HIV Screen 4th Generation wRfx: NONREACTIVE

## 2019-09-14 MED ORDER — FAMOTIDINE IN NACL 20-0.9 MG/50ML-% IV SOLN
20.0000 mg | Freq: Two times a day (BID) | INTRAVENOUS | Status: DC
  Administered 2019-09-14 – 2019-09-19 (×12): 20 mg via INTRAVENOUS
  Filled 2019-09-14 (×11): qty 50

## 2019-09-14 MED ORDER — POLYETHYLENE GLYCOL 3350 17 G PO PACK: 17.0000 g | PACK | Freq: Every day | ORAL | Status: DC | PRN

## 2019-09-14 MED ORDER — ONDANSETRON HCL 4 MG/2ML IJ SOLN: 4.0000 mg | Freq: Four times a day (QID) | INTRAMUSCULAR | Status: DC | PRN

## 2019-09-14 MED ORDER — SENNOSIDES-DOCUSATE SODIUM 8.6-50 MG PO TABS: 2.0000 | ORAL_TABLET | Freq: Every evening | ORAL | Status: DC | PRN

## 2019-09-14 MED ORDER — ACETAMINOPHEN 325 MG PO TABS
650.0000 mg | ORAL_TABLET | Freq: Four times a day (QID) | ORAL | Status: DC | PRN
  Administered 2019-09-14 – 2019-09-17 (×3): 650 mg via ORAL
  Filled 2019-09-14 (×3): qty 2

## 2019-09-14 MED ORDER — FENTANYL CITRATE (PF) 100 MCG/2ML IJ SOLN: 25.0000 ug | INTRAMUSCULAR | Status: DC | PRN

## 2019-09-14 MED ORDER — SODIUM CHLORIDE 0.9 % IV SOLN
INTRAVENOUS | Status: AC
  Administered 2019-09-14: 04:00:00 via INTRAVENOUS

## 2019-09-14 MED ORDER — HYDRALAZINE HCL 20 MG/ML IJ SOLN: 10.0000 mg | INTRAMUSCULAR | Status: DC | PRN

## 2019-09-14 MED ORDER — PIPERACILLIN-TAZOBACTAM 3.375 G IVPB
3.3750 g | Freq: Three times a day (TID) | INTRAVENOUS | Status: DC
  Administered 2019-09-14 – 2019-09-19 (×15): 3.375 g via INTRAVENOUS
  Filled 2019-09-14 (×18): qty 50

## 2019-09-14 MED ORDER — ACETAMINOPHEN 650 MG RE SUPP: 650.0000 mg | Freq: Four times a day (QID) | RECTAL | Status: DC | PRN

## 2019-09-14 MED ORDER — SODIUM CHLORIDE 0.9 % IV SOLN
INTRAVENOUS | Status: DC
  Administered 2019-09-14: 22:00:00 via INTRAVENOUS

## 2019-09-14 MED ORDER — ONDANSETRON HCL 4 MG PO TABS: 4.0000 mg | ORAL_TABLET | Freq: Four times a day (QID) | ORAL | Status: DC | PRN

## 2019-09-14 NOTE — Progress Notes (Signed)
CC: Abdominal pain  Subjective: He had cramps and fever earlier, but that is better. He is not having any pain.  He looks comfortable and is not in any distress.  Objective: Vital signs in last 24 hours: Temp:  [98.2 F (36.8 C)-100 F (37.8 C)] 98.2 F (36.8 C) (10/20 0558) Pulse Rate:  [70-100] 70 (10/20 0558) Resp:  [16-20] 16 (10/20 0558) BP: (134-159)/(71-91) 137/71 (10/20 0558) SpO2:  [95 %-98 %] 97 % (10/20 0558) Last BM Date: 09/12/19(last wednesday for normal BM ) 100 IV recorded Urine x 2 Stool x 1 recorded Tm 100; fever improving BP was up, better now WBC 13.1 >> 9.6 CMP stable; creatinine stable CT scan 09/13/2019 consistent with perforated sigmoid: Diverticulitis with localized gas in the vicinity of inflamed sigmoid colon.  There is also a 4.3 cm intramural gas collection at the sigmoid colon sugesstive of abscess. Stable nodular masslike area in the right middle lobe thought to be benign.  Intake/Output from previous day: 10/19 0701 - 10/20 0700 In: 100 [IV Piggyback:100] Out: -  Intake/Output this shift: No intake/output data recorded.  General appearance: alert, cooperative and no distress Resp: clear to auscultation bilaterally GI: soft, non-tender; bowel sounds normal; no masses,  no organomegaly  Lab Results:  Recent Labs    09/13/19 1841 09/14/19 0541  WBC 13.1* 9.6  HGB 13.0 11.7*  HCT 40.7 37.0*  PLT 391 320    BMET Recent Labs    09/13/19 1841 09/14/19 0541  NA 137 139  K 4.1 3.7  CL 105 106  CO2 20* 23  GLUCOSE 101* 94  BUN 18 18  CREATININE 1.00 1.06  CALCIUM 9.0 8.5*   PT/INR No results for input(s): LABPROT, INR in the last 72 hours.  Recent Labs  Lab 09/13/19 1841  AST 27  ALT 26  ALKPHOS 50  BILITOT 0.7  PROT 7.8  ALBUMIN 3.3*     Lipase     Component Value Date/Time   LIPASE 23 09/13/2019 1841     Prior to Admission medications   Medication Sig Start Date End Date Taking? Authorizing Provider   adalimumab ([name] PEN) 40 MG/0.8ML injection Inject 40 mg into the skin every 14 (fourteen) days.    Yes [provider]  cholecalciferol (VITAMIN D) 1000 UNITS tablet Take 1,000 Units by mouth daily after breakfast.    Yes [provider]  folic acid (FOLVITE) 1 MG tablet Take 1 mg by mouth daily after breakfast.    Yes [provider]  losartan (COZAAR) 100 MG tablet Take 50 mg by mouth daily after breakfast.  08/08/19  Yes [provider]  methotrexate (RHEUMATREX) 2.5 MG tablet Take 5 mg by mouth every Tuesday.    Yes [provider]  naproxen sodium (ALEVE) 220 MG tablet Take 440 mg by mouth 2 (two) times daily as needed (pain).   Yes [provider]  vitamin E 400 UNIT capsule Take 400 Units by mouth daily after breakfast.   Yes [provider]  clonazePAM (KLONOPIN) 0.5 MG tablet Take 1 tablet (0.5 mg total) by mouth 2 (two) times daily as needed for anxiety. Patient not taking: Reported on 09/13/2019 03/17/17   Noralee Space, MD  fluticasone [facility]) 50 MCG/ACT nasal spray Place 2 sprays into both nostrils 2 (two) times daily. Patient not taking: Reported on 09/13/2019 03/17/17   Noralee Space, MD  hydrocortisone (PROCTOSOL HC) 2.5 % rectal cream Place 1 application rectally 2 (two) times  daily. Patient not taking: Reported on 09/13/2019 12/29/14   Noralee Space, MD   Medications: . sodium chloride (PF)      . sodium chloride flush  3 mL Intravenous Once   . sodium chloride 100 mL/hr at 09/14/19 0401  . famotidine (PEPCID) IV 20 mg (09/14/19 0402)  . piperacillin-tazobactam (ZOSYN)  IV 3.375 g (09/14/19 [id number])    Assessment/Plan Rheumatoid arthritis -on [name]/methotrexate Hypertension Obstructive sleep apnea BMI ~34   Sigmoid diverticulitis with microperforation  -4.3 x 2.2 suspected intramural gas collection, multiple foci of extraluminal         gas within the surrounding pelvis with inflammation; negative  appendix  FEN:  IV fluids/NPO X ice chips [id number] 10/19 >> Day 2 DVT:  SCD's - he can have DVT chemical prophylaxis; we do not think this amenable to IR dran Follow up:  TBD  Plan:  Ongoing medical management. He has daily AM labs ordered.    LOS: 1 day    Pastor Sgro 09/14/2019 [phone]

## 2019-09-14 NOTE — Progress Notes (Signed)
PROGRESS NOTE    Nathaniel [name]  [id number] DOB: 1951/05/03 DOA: 09/13/2019 PCP: Pearson Grippe, MD   Brief Narrative:  68 year old with history of rheumatoid arthritis, essential hypertension, obstructive sleep apnea, fatty liver came to the hospital with complaints of abdominal pain and fever for about 3 weeks.  Noted to have leukocytosis.  CT of the abdomen pelvis concerning for sigmoid diverticulitis with localized gas concerning for perforation/intramural abscess.  General surgery was consulted.  Continue conservative treatment   Assessment & Plan:   Principal Problem:   Diverticulitis of large intestine with perforation Active Problems:   Rheumatoid arthritis (HCC)   Essential hypertension  Complicated acute sigmoid diverticulitis, perforation/intramural abscess -General surgery consulted -Bowel rest, IV fluids, supportive care.  Antiemetics -IV Zosyn.  Will require repeat CT scan in few days. -Current diet-n.p.o. with ice chips -Out of bed to chair, ambulate as much as possible.  Essential hypertension -Losartan on hold.  Rheumatoid arthritis -On methotrexate and [name] at home    DVT prophylaxis: SCDs Code Status: Full code Family Communication: Wife at bedside Disposition Plan: Maintain hospital stay for IV antibiotics, IV fluid and supportive care.  Consultants:   General surgery  Procedures:   None  Antimicrobials:   Zosyn day 2   Subjective: Denies any complaints this morning including abdominal pain.  Tells me he feels better. Last colonoscopy in 2015 showing diverticular disease with [facility] and was told next will be in 2025  Review of Systems Otherwise negative except as per HPI, including: General = no fevers, chills, dizziness, malaise, fatigue HEENT/EYES = negative for pain, redness, loss of vision, double vision, blurred vision, loss of hearing, sore throat, hoarseness, dysphagia Cardiovascular= negative for chest pain,  palpitation, murmurs, lower extremity swelling Respiratory/lungs= negative for shortness of breath, cough, hemoptysis, wheezing, mucus production Gastrointestinal= negative for nausea, vomiting,, abdominal pain, melena, hematemesis Genitourinary= negative for Dysuria, Hematuria, Change in Urinary Frequency MSK = Negative for arthralgia, myalgias, Back Pain, Joint swelling  Neurology= Negative for headache, seizures, numbness, tingling  Psychiatry= Negative for anxiety, depression, suicidal and homocidal ideation Allergy/Immunology= Medication/Food allergy as listed  Skin= Negative for Rash, lesions, ulcers, itching   Objective: Vitals:   09/13/19 2230 09/13/19 2330 09/14/19 0040 09/14/19 0558  BP: (!) 152/86 (!) 150/90 (!) 153/86 137/71  Pulse: 84 80 81 70  Resp: 16 19 20 16   Temp:   98.4 F (36.9 C) 98.2 F (36.8 C)  TempSrc:   Oral   SpO2: 95% 96% 98% 97%    Intake/Output Summary (Last 24 hours) at 09/14/2019 0810 Last data filed at 09/14/2019 0300 Gross per 24 hour  Intake 100 ml  Output -  Net 100 ml   There were no vitals filed for this visit.  Examination:  General exam: Appears calm and comfortable  Respiratory system: Clear to auscultation. Respiratory effort normal. Cardiovascular system: S1 & S2 heard, RRR. No JVD, murmurs, rubs, gallops or clicks. No pedal edema. Gastrointestinal system: Abdomen is nondistended, soft and nontender. No organomegaly or masses felt. Normal bowel sounds heard. Central nervous system: Alert and oriented. No focal neurological deficits. Extremities: Symmetric 5 x 5 power. Skin: No rashes, lesions or ulcers Psychiatry: Judgement and insight appear normal. Mood & affect appropriate.     Data Reviewed:   CBC: Recent Labs  Lab 09/13/19 1841 09/14/19 0541  WBC 13.1* 9.6  NEUTROABS 10.7* 6.1  HGB 13.0 11.7*  HCT 40.7 37.0*  MCV 91.7 94.6  PLT 391 320   Basic Metabolic  Panel: Recent Labs  Lab 09/13/19 1841 09/14/19 0541   NA 137 139  K 4.1 3.7  CL 105 106  CO2 20* 23  GLUCOSE 101* 94  BUN 18 18  CREATININE 1.00 1.06  CALCIUM 9.0 8.5*   GFR: CrCl cannot be calculated (Unknown ideal weight.). Liver Function Tests: Recent Labs  Lab 09/13/19 1841  AST 27  ALT 26  ALKPHOS 50  BILITOT 0.7  PROT 7.8  ALBUMIN 3.3*   Recent Labs  Lab 09/13/19 1841  LIPASE 23   No results for input(s): AMMONIA in the last 168 hours. Coagulation Profile: No results for input(s): INR, PROTIME in the last 168 hours. Cardiac Enzymes: No results for input(s): CKTOTAL, CKMB, CKMBINDEX, TROPONINI in the last 168 hours. BNP (last 3 results) No results for input(s): PROBNP in the last 8760 hours. HbA1C: No results for input(s): HGBA1C in the last 72 hours. CBG: No results for input(s): GLUCAP in the last 168 hours. Lipid Profile: No results for input(s): CHOL, HDL, LDLCALC, TRIG, CHOLHDL, LDLDIRECT in the last 72 hours. Thyroid Function Tests: No results for input(s): TSH, T4TOTAL, FREET4, T3FREE, THYROIDAB in the last 72 hours. Anemia Panel: No results for input(s): VITAMINB12, FOLATE, FERRITIN, TIBC, IRON, RETICCTPCT in the last 72 hours. Sepsis Labs: No results for input(s): PROCALCITON, LATICACIDVEN in the last 168 hours.  No results found for this or any previous visit (from the past 240 hour(s)).       Radiology Studies: Ct Abdomen Pelvis W Contrast  Result Date: 09/13/2019 CLINICAL DATA:  Increased abdominal pain on the left side EXAM: CT ABDOMEN AND PELVIS WITH CONTRAST TECHNIQUE: Multidetector CT imaging of the abdomen and pelvis was performed using the standard protocol following bolus administration of intravenous contrast. CONTRAST:  OMNIPAQUE IOHEXOL 300 MG/ML  SOLN COMPARISON:  MRI 02/25/2014, CT chest 03/02/2014 FINDINGS: Lower chest: Lung bases demonstrate irregular nodular airspace disease at the medial right base, no change since 2015 and therefore presumed to represent a benign finding.  On that study, this was felt to represent a sequestration. No pleural effusion. Normal heart size. Hepatobiliary: Stable cyst within the left hepatic lobe. No calcified gallstone or biliary dilatation Pancreas: Unremarkable. No pancreatic ductal dilatation or surrounding inflammatory changes. Spleen: Normal in size without focal abnormality. Adrenals/Urinary Tract: Adrenal glands are unremarkable. Kidneys are normal, without renal calculi, focal lesion, or hydronephrosis. Bladder is unremarkable. Stomach/Bowel: The stomach is nonenlarged. No dilated small bowel. Focally thickened small bowel loop within the central pelvis, series 2, image number 57, likely reactive. Prominent wall thickening of the sigmoid colon with surrounding inflammatory change consistent with acute diverticulitis. 4.3 x 2.2 cm suspected intramural gas collection. Multiple foci of extraluminal gas within the central pelvis with surrounding inflammation. Negative appendix. Vascular/Lymphatic: Mild aortic atherosclerosis. No aneurysm. No significantly enlarged lymph nodes Reproductive: Prostate is unremarkable. Other: No significant free fluid Musculoskeletal: No acute or suspicious osseous abnormality. IMPRESSION: 1. Findings felt consistent with perforated sigmoid colon diverticulitis with localized gas in the vicinity of inflamed sigmoid colon. There also appears to be a 4.3 cm intramural gas collection at the sigmoid colon suggestive of abscess. 2. Thickened small bowel loops within the pelvis, likely reactive to the inflammatory changes at the sigmoid colon. 3. Stable nodular masslike area in the medial right lower lobe, felt benign; this is thought to represent sequestration on prior chest imaging. Critical Value/emergent results were called by telephone at the time of interpretation on 09/13/2019 at 11:05 pm to providerRACHEL LITTLE , who  verbally acknowledged these results. Electronically Signed   By: Jasmine Pang M.D.   On: 09/13/2019  23:05        Scheduled Meds: . sodium chloride (PF)      . sodium chloride flush  3 mL Intravenous Once   Continuous Infusions: . sodium chloride 100 mL/hr at 09/14/19 0401  . famotidine (PEPCID) IV 20 mg (09/14/19 0402)  . piperacillin-tazobactam (ZOSYN)  IV 3.375 g (09/14/19 0620)     LOS: 1 day   Time spent= 25 mins    Taleia Sadowski Joline Maxcy, MD [facility]  If 7PM-7AM, please contact night-coverage  09/14/2019, 8:10 AM
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
## 2019-09-14 NOTE — Progress Notes (Signed)
Spoke with pt regarding cpap.  Pt stated he has not wore cpap at home in a long time and does not want to wear one while here in the hospital.  Pt was advised that RT is available all night should he change his mind.

## 2019-09-14 NOTE — Progress Notes (Signed)
Pharmacy Antibiotic Note  Nathaniel [name] is a 68 y.o. male admitted on 09/13/2019 with Intra-abdominal infection.  Pharmacy has been consulted for zosyn dosing.  Plan: Zosyn 3.375g IV q8h (4 hour infusion).     Temp (24hrs), Avg:99.1 F (37.3 C), Min:98.4 F (36.9 C), Max:100 F (37.8 C)  Recent Labs  Lab 09/13/19 1841  WBC 13.1*  CREATININE 1.00    CrCl cannot be calculated (Unknown ideal weight.).    No Known Allergies  Antimicrobials this admission: Zosyn 09/14/2019 >>   Dose adjustments this admission: -  Microbiology results: -  Thank you for allowing pharmacy to be a part of this patient's care.  Nani Skillern Crowford 09/14/2019 3:09 AM
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
## 2019-09-15 LAB — BASIC METABOLIC PANEL
Anion gap: 12 (ref 5–15)
BUN: 19 mg/dL (ref 8–23)
CO2: 19 mmol/L — ABNORMAL LOW (ref 22–32)
Calcium: 8.1 mg/dL — ABNORMAL LOW (ref 8.9–10.3)
Chloride: 107 mmol/L (ref 98–111)
Creatinine, Ser: 1.14 mg/dL (ref 0.61–1.24)
GFR calc Af Amer: >60 mL/min (ref >60)
GFR calc non Af Amer: >60 mL/min (ref >60)
Glucose, Bld: 71 mg/dL (ref 70–99)
Potassium: 3.9 mmol/L (ref 3.5–5.1)
Sodium: 138 mmol/L (ref 135–145)

## 2019-09-15 LAB — GLUCOSE, CAPILLARY
Glucose-Capillary: 66 mg/dL — ABNORMAL LOW (ref 70–99)
Glucose-Capillary: 113 mg/dL — ABNORMAL HIGH (ref 70–99)
Glucose-Capillary: 59 mg/dL — ABNORMAL LOW (ref 70–99)
Glucose-Capillary: 70 mg/dL (ref 70–99)

## 2019-09-15 LAB — CBC
HCT: 37.4 % — ABNORMAL LOW (ref 39.0–52.0)
Hemoglobin: 11.9 g/dL — ABNORMAL LOW (ref 13.0–17.0)
MCH: 29.2 pg (ref 26.0–34.0)
MCHC: 31.8 g/dL (ref 30.0–36.0)
MCV: 91.9 fL (ref 80.0–100.0)
Platelets: 332 10*3/uL (ref 150–400)
RBC: 4.07 MIL/uL — ABNORMAL LOW (ref 4.22–5.81)
RDW: 12 % (ref 11.5–15.5)
WBC: 6.7 10*3/uL (ref 4.0–10.5)
nRBC: 0 % (ref 0.0–0.2)

## 2019-09-15 LAB — MAGNESIUM: Magnesium: 2.4 mg/dL (ref 1.7–2.4)

## 2019-09-15 MED ORDER — POTASSIUM CHLORIDE 2 MEQ/ML IV SOLN: INTRAVENOUS | Status: DC

## 2019-09-15 MED ORDER — KCL IN DEXTROSE-NACL 20-5-0.9 MEQ/L-%-% IV SOLN
INTRAVENOUS | Status: AC
  Administered 2019-09-15: 17:00:00 via INTRAVENOUS
  Filled 2019-09-15 (×2): qty 1000

## 2019-09-15 MED ORDER — GLUCOSE 40 % PO GEL
1.0000 | Freq: Every day | ORAL | Status: DC | PRN
  Administered 2019-09-15 (×2): 37.5 g via ORAL
  Filled 2019-09-15 (×2): qty 1

## 2019-09-15 NOTE — Progress Notes (Signed)
Pt has declined use of nocturnal cpap while here in the hospital.  Pt is aware that RT is available should he change his mind.  Rx changed to prn.  RT will assist as needed.

## 2019-09-15 NOTE — Progress Notes (Signed)
PROGRESS NOTE   Nathaniel [name]  [id number]    DOB: 03-03-1951    DOA: 09/13/2019  PCP: Jani Gravel, MD   I have briefly reviewed patients previous medical records in [facility].  Chief Complaint  Patient presents with  . Abdominal Pain    Brief Narrative:  69 year old married male with PMH of rheumatoid arthritis, essential hypertension, OSA, fatty liver, diverticulosis, GERD, presented to ED due to abdominal pain and fevers.  CT abdomen showed findings consistent with perforated sigmoid colon diverticulitis and intramural abscess.  General surgery consulted and managing conservatively.   Assessment & Plan:   Principal Problem:   Diverticulitis of large intestine with perforation Active Problems:   Rheumatoid arthritis ([place])   Essential hypertension   Sigmoid diverticulitis with microperforation  General surgery consultation and follow-up appreciated.  CT abdomen: Findings felt consistent with perforated sigmoid colon diverticulitis with localized gas in the vicinity of inflamed sigmoid colon.  There also appears to be a 4.3 cm intramural gas collection at the sigmoid colon suggestive of abscess.  Patient on immunosuppressants prior to admission, currently on hold.  Patient treated conservatively with bowel rest/n.p.o., IV fluids, IV Zosyn and pain management.  Clinically improved with pain almost resolved.  However as per surgery follow-up, due to being on immunosuppressants he is at high risk for abscess given intramural air and hence recommending repeating CT tomorrow evening to make sure he is improving prior to advancing diet and discharging home on oral antibiotics.  Patient reports colonoscopy in the last 5 years by Dr. Henrene Pastor, [facility] that showed diverticulosis.  Lung nodule  On CT abdomen, noted stable nodular masslike area in the medial right lower lobe, felt benign, this is thought to represent sequestration on prior chest imaging as per radiology.   Outpatient follow-up.  Essential hypertension  Reasonably controlled.  Losartan currently on hold.  Monitor.  Rheumatoid arthritis  Hold methotrexate and [name] on hold due to acute diverticulitis.  GERD  Continue Pepcid.  Obesity/Body mass index is 36.56 kg/m.   Anemia  Suspect dilutional.  Follow CBC.  Hypoglycemia  Change fluids to D5.  Oral glucose gel as needed.  Monitor CBGs.  DVT prophylaxis: SCDs Code Status: Full Family Communication: Discussed in detail with patient spouse at bedside, updated care and answered questions. Disposition: DC home pending clinical improvement, possibly 10/23.   Consultants:  General surgery  Procedures:  None  Antimicrobials:  IV Zosyn   Subjective: Overall feels much better.  Abdominal pain almost resolved.  No nausea or vomiting.  Had loose BM without blood.  Objective:  Vitals:   09/14/19 2136 09/15/19 0645 09/15/19 1122 09/15/19 1439  BP: 133/74 (!) 156/76 (!) 156/76 (!) 150/79  Pulse: 68 75 75 69  Resp: 18 18 18 18   Temp: 97.8 F (36.6 C) (!) 97.5 F (36.4 C) (!) 97.5 F (36.4 C) 97.9 F (36.6 C)  TempSrc: Oral Oral Oral Oral  SpO2: 96% 96%  97%  Weight:   105.9 kg   Height:   5' 7.01" (1.702 m)     Examination:  General exam: Pleasant middle-age male, moderately built and obese lying comfortably propped up in bed without distress.  Oral mucosa moist. Respiratory system: Clear to auscultation. Respiratory effort normal. Cardiovascular system: S1 & S2 heard, RRR. No JVD, murmurs, rubs, gallops or clicks. No pedal edema. Gastrointestinal system: Abdomen is nondistended, soft and nontender. No organomegaly or masses felt. Normal bowel sounds heard. Central nervous system: Alert and oriented. No  focal neurological deficits. Extremities: Symmetric 5 x 5 power. Skin: No rashes, lesions or ulcers Psychiatry: Judgement and insight appear normal. Mood & affect appropriate.     Data Reviewed: I have personally  reviewed following labs and imaging studies  CBC: Recent Labs  Lab 09/13/19 1841 09/14/19 0541 09/15/19 0547  WBC 13.1* 9.6 6.7  NEUTROABS 10.7* 6.1  --   HGB 13.0 11.7* 11.9*  HCT 40.7 37.0* 37.4*  MCV 91.7 94.6 91.9  PLT 391 320 [id number]   Basic Metabolic Panel: Recent Labs  Lab 09/13/19 1841 09/14/19 0541 09/15/19 0547  NA 137 139 138  K 4.1 3.7 3.9  CL 105 106 107  CO2 20* 23 19*  GLUCOSE 101* 94 71  BUN 18 18 19   CREATININE 1.00 1.06 1.14  CALCIUM 9.0 8.5* 8.1*  MG  --   --  2.4   Liver Function Tests: Recent Labs  Lab 09/13/19 1841  AST 27  ALT 26  ALKPHOS 50  BILITOT 0.7  PROT 7.8  ALBUMIN 3.3*    Cardiac Enzymes: No results for input(s): CKTOTAL, CKMB, CKMBINDEX, TROPONINI in the last 168 hours.  CBG: Recent Labs  Lab 09/15/19 0801 09/15/19 0926  GLUCAP 66* 113*    Recent Results (from the past 240 hour(s))  Culture, blood (routine x 2)     Status: None (Preliminary result)   Collection Time: 09/13/19 11:02 PM   Specimen: BLOOD  Result Value Ref Range Status   Specimen Description   Final    BLOOD RIGHT ANTECUBITAL Performed at [facility] [address], [place], [place] [address]    Special Requests   Final    BOTTLES DRAWN AEROBIC AND ANAEROBIC Blood Culture results may not be optimal due to an excessive volume of blood received in culture bottles Performed at [facility] [address] [place], [place] [address]    Culture   Final    NO GROWTH 1 DAY Performed at [facility], [place] [address], [place], [place] [address]    Report Status PENDING  Incomplete  Culture, blood (routine x 2)     Status: None (Preliminary result)   Collection Time: 09/13/19 11:07 PM   Specimen: BLOOD  Result Value Ref Range Status   Specimen Description   Final    BLOOD RIGHT HAND Performed at [facility] [address], [place], [place] [address]    Special Requests   Final    BOTTLES  DRAWN AEROBIC AND ANAEROBIC Blood Culture adequate volume Performed at [facility] [address], [place], [place] [address]    Culture   Final    NO GROWTH 1 DAY Performed at [facility], [place] [address] [place], [place] [address]    Report Status PENDING  Incomplete  SARS CORONAVIRUS 2 (TAT 6-24 HRS) Nasopharyngeal Nasopharyngeal Swab     Status: None   Collection Time: 09/13/19 11:25 PM   Specimen: Nasopharyngeal Swab  Result Value Ref Range Status   SARS Coronavirus 2 NEGATIVE NEGATIVE Final    Comment: (NOTE) SARS-CoV-2 target nucleic acids are NOT DETECTED. The SARS-CoV-2 RNA is generally detectable in upper and lower respiratory specimens during the acute phase of infection. Negative results do not preclude SARS-CoV-2 infection, do not rule out co-infections with other pathogens, and should not be used as the sole basis for treatment or other patient management decisions. Negative results must be combined with clinical observations, patient history, and epidemiological  information. The expected result is Negative. Fact Sheet for Patients: SugarRoll.be Fact Sheet for Healthcare Providers: https://www.woods-mathews.com/ This test is not yet approved or cleared by the [place] FDA and  has been authorized for detection and/or diagnosis of SARS-CoV-2 by FDA under an Emergency Use Authorization (EUA). This EUA will remain  in effect (meaning this test can be used) for the duration of the COVID-19 declaration under Section 56 4(b)(1) of the Act, 21 U.S.C. section 360bbb-3(b)(1), unless the authorization is terminated or revoked sooner. Performed at [facility], [place] [address] [place], [place] [address]          Radiology Studies: Ct Abdomen Pelvis W Contrast  Result Date: 09/13/2019 CLINICAL DATA:  Increased abdominal pain on the left side EXAM: CT ABDOMEN AND PELVIS WITH CONTRAST  TECHNIQUE: Multidetector CT imaging of the abdomen and pelvis was performed using the standard protocol following bolus administration of intravenous contrast. CONTRAST:  112mL OMNIPAQUE IOHEXOL 300 MG/ML  SOLN COMPARISON:  MRI 02/25/2014, CT chest 03/02/2014 FINDINGS: Lower chest: Lung bases demonstrate irregular nodular airspace disease at the medial right base, no change since 2015 and therefore presumed to represent a benign finding. On that study, this was felt to represent a sequestration. No pleural effusion. Normal heart size. Hepatobiliary: Stable cyst within the left hepatic lobe. No calcified gallstone or biliary dilatation Pancreas: Unremarkable. No pancreatic ductal dilatation or surrounding inflammatory changes. Spleen: Normal in size without focal abnormality. Adrenals/Urinary Tract: Adrenal glands are unremarkable. Kidneys are normal, without renal calculi, focal lesion, or hydronephrosis. Bladder is unremarkable. Stomach/Bowel: The stomach is nonenlarged. No dilated small bowel. Focally thickened small bowel loop within the central pelvis, series 2, image number 57, likely reactive. Prominent wall thickening of the sigmoid colon with surrounding inflammatory change consistent with acute diverticulitis. 4.3 x 2.2 cm suspected intramural gas collection. Multiple foci of extraluminal gas within the central pelvis with surrounding inflammation. Negative appendix. Vascular/Lymphatic: Mild aortic atherosclerosis. No aneurysm. No significantly enlarged lymph nodes Reproductive: Prostate is unremarkable. Other: No significant free fluid Musculoskeletal: No acute or suspicious osseous abnormality. IMPRESSION: 1. Findings felt consistent with perforated sigmoid colon diverticulitis with localized gas in the vicinity of inflamed sigmoid colon. There also appears to be a 4.3 cm intramural gas collection at the sigmoid colon suggestive of abscess. 2. Thickened small bowel loops within the pelvis, likely reactive  to the inflammatory changes at the sigmoid colon. 3. Stable nodular masslike area in the medial right lower lobe, felt benign; this is thought to represent sequestration on prior chest imaging. Critical Value/emergent results were called by telephone at the time of interpretation on 09/13/2019 at 11:05 pm to providerRACHEL LITTLE , who verbally acknowledged these results. Electronically Signed   By: Donavan Foil M.D.   On: 09/13/2019 23:05        Scheduled Meds: . sodium chloride flush  3 mL Intravenous Once   Continuous Infusions: . famotidine (PEPCID) IV 20 mg (09/15/19 1025)  . piperacillin-tazobactam (ZOSYN)  IV 3.375 g (09/15/19 1314)     LOS: 2 days     Vernell Leep, MD, FACP, [facility]. [facility]  To contact the attending provider between 7A-7P or the covering provider during after hours 7P-7A, please log into the web site [email or web address] and access using universal [place] password for that web site. If you do not have the password, please call the hospital operator.  09/15/2019, 4:09 PM

## 2019-09-15 NOTE — Progress Notes (Signed)
    CC: Abdominal discomfort/constipation  Subjective: Currently he remains asymptomatic.  Abdomen is soft nontender.  Objective: Vital signs in last 24 hours: Temp:  [97.5 F (36.4 C)-98.6 F (37 C)] 97.5 F (36.4 C) (10/21 0645) Pulse Rate:  [68-75] 75 (10/21 0645) Resp:  [18-20] 18 (10/21 0645) BP: (133-156)/(74-76) 156/76 (10/21 0645) SpO2:  [96 %] 96 % (10/21 0645) Last BM Date: 09/14/19 785 IV recorded Voided x2 -nothing else in the I&O T-max 100 on admission; afebrile since admission. Vital signs are stable. Labs are stable; WBC 6.7 Intake/Output from previous day: 10/20 0701 - 10/21 0700 In: 785.7 [I.V.:500; IV Piggyback:285.7] Out: -  Intake/Output this shift: No intake/output data recorded.  General appearance: alert, cooperative and no distress Resp: clear to auscultation bilaterally GI: Soft, nontender, few bowel sounds, no BM.  Lab Results:  Recent Labs    09/14/19 0541 09/15/19 0547  WBC 9.6 6.7  HGB 11.7* 11.9*  HCT 37.0* 37.4*  PLT 320 332    BMET Recent Labs    09/14/19 0541 09/15/19 0547  NA 139 138  K 3.7 3.9  CL 106 107  CO2 23 19*  GLUCOSE 94 71  BUN 18 19  CREATININE 1.06 1.14  CALCIUM 8.5* 8.1*   PT/INR No results for input(s): LABPROT, INR in the last 72 hours.  Recent Labs  Lab 09/13/19 1841  AST 27  ALT 26  ALKPHOS 50  BILITOT 0.7  PROT 7.8  ALBUMIN 3.3*     Lipase     Component Value Date/Time   LIPASE 23 09/13/2019 1841     Medications: . sodium chloride flush  3 mL Intravenous Once    Assessment/Plan Rheumatoid arthritis -on [name] 9/27-last dose; methotrexate10/13 - last dose Hypertension Obstructive sleep apnea -does not use CPAP BMI ~34   Sigmoid diverticulitis with microperforation  -4.3 x 2.2 cm suspected intramural gas collection, multiple foci of extraluminal         gas within the surrounding pelvis with inflammation; negative appendix  FEN:  IV fluids/NPO X ice chips [id number] 10/19  >> Day 3 DVT:  SCD's - he can have DVT chemical prophylaxis; we do not think this amenable to IR dran Follow up:  TBD  Plan: Ongoing bowel rest; antibiotics, IV fluid hydration.  I will discuss starting clear liquids.      LOS: 2 days    Mileah Hemmer 09/15/2019 Please see Amion

## 2019-09-15 NOTE — Progress Notes (Signed)
PHARMACY NOTE -  [facility] has been assisting with dosing of Zosyn  for Intra-abd infection. Dosage remains stable at 3.375gm q8 and need for further dosage adjustment appears unlikely at present.   Will monitor renal function and adjust dose/schedule if needed  Will sign off at this time.  Please reconsult if a change in clinical status warrants re-evaluation of dosage.  Minda Ditto PharmD Pager [phone] 09/15/2019, 1:20 PM

## 2019-09-16 ENCOUNTER — Inpatient Hospital Stay (HOSPITAL_COMMUNITY): Payer: BC Managed Care – PPO

## 2019-09-16 LAB — CBC
HCT: 36.8 % — ABNORMAL LOW (ref 39.0–52.0)
Hemoglobin: 11.9 g/dL — ABNORMAL LOW (ref 13.0–17.0)
MCH: 29.5 pg (ref 26.0–34.0)
MCHC: 32.3 g/dL (ref 30.0–36.0)
MCV: 91.3 fL (ref 80.0–100.0)
Platelets: 362 10*3/uL (ref 150–400)
RBC: 4.03 MIL/uL — ABNORMAL LOW (ref 4.22–5.81)
RDW: 11.9 % (ref 11.5–15.5)
WBC: 6.3 10*3/uL (ref 4.0–10.5)
nRBC: 0 % (ref 0.0–0.2)

## 2019-09-16 LAB — BASIC METABOLIC PANEL
Anion gap: 10 (ref 5–15)
BUN: 12 mg/dL (ref 8–23)
CO2: 21 mmol/L — ABNORMAL LOW (ref 22–32)
Calcium: 8.3 mg/dL — ABNORMAL LOW (ref 8.9–10.3)
Chloride: 108 mmol/L (ref 98–111)
Creatinine, Ser: 1.06 mg/dL (ref 0.61–1.24)
GFR calc Af Amer: >60 mL/min (ref >60)
GFR calc non Af Amer: >60 mL/min (ref >60)
Glucose, Bld: 91 mg/dL (ref 70–99)
Potassium: 3.9 mmol/L (ref 3.5–5.1)
Sodium: 139 mmol/L (ref 135–145)

## 2019-09-16 LAB — GLUCOSE, CAPILLARY
Glucose-Capillary: 87 mg/dL (ref 70–99)
Glucose-Capillary: 88 mg/dL (ref 70–99)
Glucose-Capillary: 77 mg/dL (ref 70–99)

## 2019-09-16 MED ORDER — IOHEXOL 300 MG/ML  SOLN
30.0000 mL | Freq: Once | INTRAMUSCULAR | Status: AC | PRN
  Administered 2019-09-16: 30 mL via ORAL

## 2019-09-16 MED ORDER — SODIUM CHLORIDE (PF) 0.9 % IJ SOLN
INTRAMUSCULAR | Status: AC
  Filled 2019-09-16: qty 50

## 2019-09-16 MED ORDER — KCL IN DEXTROSE-NACL 20-5-0.9 MEQ/L-%-% IV SOLN
INTRAVENOUS | Status: DC
  Administered 2019-09-17: 03:00:00 via INTRAVENOUS
  Filled 2019-09-16 (×2): qty 1000

## 2019-09-16 MED ORDER — IOHEXOL 300 MG/ML  SOLN
100.0000 mL | Freq: Once | INTRAMUSCULAR | Status: AC | PRN
  Administered 2019-09-16: 100 mL via INTRAVENOUS

## 2019-09-16 NOTE — Progress Notes (Signed)
PROGRESS NOTE   Nathaniel [name]  [id number]    DOB: [birth year]    DOA: 09/13/2019  PCP: Jani Gravel, MD   I have briefly reviewed patients previous medical records in [facility].  Chief Complaint  Patient presents with  . Abdominal Pain    Brief Narrative:  68 year old married male with PMH of rheumatoid arthritis, essential hypertension, OSA, fatty liver, diverticulosis, GERD, presented to ED due to abdominal pain and fevers.  CT abdomen showed findings consistent with perforated sigmoid colon diverticulitis and intramural abscess.  General surgery consulted and managing conservatively.   Assessment & Plan:   Principal Problem:   Diverticulitis of large intestine with perforation Active Problems:   Rheumatoid arthritis ([place])   Essential hypertension   Sigmoid diverticulitis with microperforation  General surgery consultation and follow-up appreciated.  CT abdomen: Findings felt consistent with perforated sigmoid colon diverticulitis with localized gas in the vicinity of inflamed sigmoid colon.  There also appears to be a 4.3 cm intramural gas collection at the sigmoid colon suggestive of abscess.  Patient on immunosuppressants prior to admission, currently on hold.  Patient treated conservatively with bowel rest/n.p.o., IV fluids, IV Zosyn and pain management.  Clinically improved and pain has resolved.  However as per surgery follow-up, due to being on immunosuppressants he is at high risk for abscess given intramural air and hence repeating CT abdomen this evening to make sure he is improving prior to advancing diet and discharging home on oral antibiotics.  Patient reports colonoscopy in the last 5 years by Dr. Henrene Pastor, [facility] that showed diverticulosis.  Lung nodule  On CT abdomen, noted stable nodular masslike area in the medial right lower lobe, felt benign, this is thought to represent sequestration on prior chest imaging as per radiology.  Outpatient  follow-up.  Essential hypertension  Reasonably controlled.  Losartan currently on hold.  Monitor.  Rheumatoid arthritis  Hold methotrexate and [name] on hold due to acute diverticulitis.  Patient reports that he spoke with Dr. Amil Amen, Rheumatology who advised him to hold off on these medications until his acute diverticulitis has completely resolved.  Outpatient follow-up with Dr. Amil Amen  GERD  Continue Pepcid.  Obesity/Body mass index is 36.56 kg/m.   Anemia  Suspect dilutional.  Stable.  Hypoglycemia  Change fluids to D5.  Oral glucose gel as needed.  Monitor CBGs.  No further episodes since last evening.  DVT prophylaxis: SCDs Code Status: Full Family Communication: Discussed in detail with patient spouse at bedside, updated care and answered questions. Disposition: DC home pending clinical improvement, possibly 10/23.   Consultants:  General surgery  Procedures:  None  Antimicrobials:  IV Zosyn   Subjective: No abdominal pain.  Had small loose BM this morning.  No nausea or vomiting.  Objective:  Vitals:   09/15/19 1439 09/15/19 2101 09/16/19 0605 09/16/19 1501  BP: (!) 150/79 (!) 164/78 (!) 166/71 (!) 160/88  Pulse: 69 64 69 70  Resp: 18 16 16 18   Temp: 97.9 F (36.6 C) 97.8 F (36.6 C) 98.1 F (36.7 C) 98.5 F (36.9 C)  TempSrc: Oral Oral Oral Oral  SpO2: 97% 97% 98% 96%  Weight:      Height:        Examination:  General exam: Pleasant middle-age male, moderately built and obese seen ambulating comfortably in the room without distress.  Oral mucosa moist. Respiratory system: Clear to auscultation. Respiratory effort normal. Cardiovascular system: S1 & S2 heard, RRR. No JVD, murmurs, rubs, gallops or  clicks. No pedal edema. Gastrointestinal system: Abdomen is nondistended, soft and nontender.  Normal bowel sounds heard. Central nervous system: Alert and oriented. No focal neurological deficits. Extremities: Symmetric 5 x 5 power. Skin: No  rashes, lesions or ulcers Psychiatry: Judgement and insight appear normal. Mood & affect appropriate.     Data Reviewed: I have personally reviewed following labs and imaging studies  CBC: Recent Labs  Lab 09/13/19 1841 09/14/19 0541 09/15/19 0547 09/16/19 0611  WBC 13.1* 9.6 6.7 6.3  NEUTROABS 10.7* 6.1  --   --   HGB 13.0 11.7* 11.9* 11.9*  HCT 40.7 37.0* 37.4* 36.8*  MCV 91.7 94.6 91.9 91.3  PLT 391 320 332 [id number]   Basic Metabolic Panel: Recent Labs  Lab 09/13/19 1841 09/14/19 0541 09/15/19 0547 09/16/19 0611  NA 137 139 138 139  K 4.1 3.7 3.9 3.9  CL 105 106 107 108  CO2 20* 23 19* 21*  GLUCOSE 101* 94 71 91  BUN 18 18 19 12   CREATININE 1.00 1.06 1.14 1.06  CALCIUM 9.0 8.5* 8.1* 8.3*  MG  --   --  2.4  --    Liver Function Tests: Recent Labs  Lab 09/13/19 1841  AST 27  ALT 26  ALKPHOS 50  BILITOT 0.7  PROT 7.8  ALBUMIN 3.3*    Cardiac Enzymes: No results for input(s): CKTOTAL, CKMB, CKMBINDEX, TROPONINI in the last 168 hours.  CBG: Recent Labs  Lab 09/15/19 1752 09/15/19 1832 09/16/19 0616 09/16/19 1046 09/16/19 1637  GLUCAP 59* 70 87 88 77    Recent Results (from the past 240 hour(s))  Culture, blood (routine x 2)     Status: None (Preliminary result)   Collection Time: 09/13/19 11:02 PM   Specimen: BLOOD  Result Value Ref Range Status   Specimen Description   Final    BLOOD RIGHT ANTECUBITAL Performed at [facility] [address] [place], [place] [address]    Special Requests   Final    BOTTLES DRAWN AEROBIC AND ANAEROBIC Blood Culture results may not be optimal due to an excessive volume of blood received in culture bottles Performed at [facility] [address], [place], [place] [address]    Culture   Final    NO GROWTH 2 DAYS Performed at [facility] [address], [place], [place] [address]    Report Status PENDING  Incomplete  Culture, blood (routine x 2)     Status: None  (Preliminary result)   Collection Time: 09/13/19 11:07 PM   Specimen: BLOOD  Result Value Ref Range Status   Specimen Description   Final    BLOOD RIGHT HAND Performed at [facility] [address] [place], [place] [address]    Special Requests   Final    BOTTLES DRAWN AEROBIC AND ANAEROBIC Blood Culture adequate volume Performed at [facility] [address], [place], [place] [address]    Culture   Final    NO GROWTH 2 DAYS Performed at [facility] [address], [place], [place] [address]    Report Status PENDING  Incomplete  SARS CORONAVIRUS 2 (TAT 6-24 HRS) Nasopharyngeal Nasopharyngeal Swab     Status: None   Collection Time: 09/13/19 11:25 PM   Specimen: Nasopharyngeal Swab  Result Value Ref Range Status   SARS Coronavirus 2 NEGATIVE NEGATIVE Final    Comment: (NOTE) SARS-CoV-2 target nucleic acids are NOT DETECTED. The SARS-CoV-2 RNA is generally  detectable in upper and lower respiratory specimens during the acute phase of infection. Negative results do not preclude SARS-CoV-2 infection, do not rule out co-infections with other pathogens, and should not be used as the sole basis for treatment or other patient management decisions. Negative results must be combined with clinical observations, patient history, and epidemiological information. The expected result is Negative. Fact Sheet for Patients: SugarRoll.be Fact Sheet for Healthcare Providers: https://www.woods-mathews.com/ This test is not yet approved or cleared by the [place] FDA and  has been authorized for detection and/or diagnosis of SARS-CoV-2 by FDA under an Emergency Use Authorization (EUA). This EUA will remain  in effect (meaning this test can be used) for the duration of the COVID-19 declaration under Section 56 4(b)(1) of the Act, 21 U.S.C. section 360bbb-3(b)(1), unless the authorization is terminated or  revoked sooner. Performed at [facility], [place] [address], [place], [place] [address]          Radiology Studies: No results found.      Scheduled Meds: . sodium chloride (PF)      . sodium chloride flush  3 mL Intravenous Once   Continuous Infusions: . famotidine (PEPCID) IV 20 mg (09/16/19 1000)  . piperacillin-tazobactam (ZOSYN)  IV 3.375 g (09/16/19 1335)     LOS: 3 days     Vernell Leep, MD, FACP, [facility]. [facility]  To contact the attending provider between 7A-7P or the covering provider during after hours 7P-7A, please log into the web site [email or web address] and access using universal [place] password for that web site. If you do not have the password, please call the hospital operator.  09/16/2019, 5:17 PM

## 2019-09-16 NOTE — Progress Notes (Signed)
    CC: Abdominal pain  Subjective: Patient remains pain-free his abdomen soft nontender.  Objective: Vital signs in last 24 hours: Temp:  [97.5 F (36.4 C)-98.1 F (36.7 C)] 98.1 F (36.7 C) (10/22 0605) Pulse Rate:  [64-75] 69 (10/22 0605) Resp:  [16-18] 16 (10/22 0605) BP: (150-166)/(71-79) 166/71 (10/22 0605) SpO2:  [97 %-98 %] 98 % (10/22 0605) Weight:  [105.9 kg] 105.9 kg (10/21 1122) Last BM Date: 09/15/19 119.8 IV recorded yesterday, nothing else Voided x 2 Afebrile vital signs are stable WBC 6.3; H&H is stable Creatinine is 1.06, potassium 3.9  Intake/Output from previous day: 10/21 0701 - 10/22 0700 In: 119.8 [IV Piggyback:119.8] Out: -  Intake/Output this shift: No intake/output data recorded.  General appearance: alert, cooperative and no distress Resp: clear to auscultation bilaterally GI: soft, non-tender; bowel sounds normal; no masses,  no organomegaly  Lab Results:  Recent Labs    09/15/19 0547 09/16/19 0611  WBC 6.7 6.3  HGB 11.9* 11.9*  HCT 37.4* 36.8*  PLT 332 362    BMET Recent Labs    09/15/19 0547 09/16/19 0611  NA 138 139  K 3.9 3.9  CL 107 108  CO2 19* 21*  GLUCOSE 71 91  BUN 19 12  CREATININE 1.14 1.06  CALCIUM 8.1* 8.3*   PT/INR No results for input(s): LABPROT, INR in the last 72 hours.  Recent Labs  Lab 09/13/19 1841  AST 27  ALT 26  ALKPHOS 50  BILITOT 0.7  PROT 7.8  ALBUMIN 3.3*     Lipase     Component Value Date/Time   LIPASE 23 09/13/2019 1841     Medications: . sodium chloride flush  3 mL Intravenous Once    Assessment/Plan Rheumatoid arthritis-on [name] 9/27-last dose; methotrexate10/13 - last dose Hypertension Obstructive sleep apnea -does not use CPAP BMI ~34   Sigmoid diverticulitis with microperforation   -4.3 x 2.2 cm suspected intramural gas collection, multiple     foci of extraluminal gas within the surrounding pelvis with inflammation; negative appendix  FEN: IV  fluids/NPO X ice chips [id number] 10/19 >> Day 3 DVT: SCD's - he can have DVT chemical prophylaxis; we do not think this amenable to IR dran Follow up: TBD   Plan: Continue current medical management.  We are going to repeat his CT scan later this afternoon to be sure he is improving.  If he is improving, we can increase his diet and convert him over to oral antibiotics.    LOS: 3 days    Nathaniel [name] 09/16/2019 Please see Amion

## 2019-09-17 LAB — GLUCOSE, CAPILLARY
Glucose-Capillary: 83 mg/dL (ref 70–99)
Glucose-Capillary: 71 mg/dL (ref 70–99)
Glucose-Capillary: 123 mg/dL — ABNORMAL HIGH (ref 70–99)

## 2019-09-17 MED ORDER — ENOXAPARIN SODIUM 60 MG/0.6ML ~~LOC~~ SOLN: 50.0000 mg | SUBCUTANEOUS | Status: DC

## 2019-09-17 MED ORDER — SODIUM CHLORIDE 0.9 % IV SOLN: INTRAVENOUS | Status: DC

## 2019-09-17 MED ORDER — KCL IN DEXTROSE-NACL 20-5-0.9 MEQ/L-%-% IV SOLN
INTRAVENOUS | Status: AC
  Administered 2019-09-17: 21:00:00 via INTRAVENOUS
  Filled 2019-09-17 (×2): qty 1000

## 2019-09-17 NOTE — Progress Notes (Signed)
PROGRESS NOTE   Nathaniel [name]  [id number]    DOB: 08-Dec-1950    DOA: 09/13/2019  PCP: Jani Gravel, MD   I have briefly reviewed patients previous medical records in [facility].  Chief Complaint  Patient presents with  . Abdominal Pain    Brief Narrative:  68 year old married male with PMH of rheumatoid arthritis, essential hypertension, OSA, fatty liver, diverticulosis, GERD, presented to ED due to abdominal pain and fevers.  CT abdomen showed findings consistent with perforated sigmoid colon diverticulitis and intramural abscess.  General surgery consulted and managing conservatively.   Assessment & Plan:   Principal Problem:   Diverticulitis of large intestine with perforation Active Problems:   Rheumatoid arthritis ([place])   Essential hypertension   Sigmoid diverticulitis with microperforation  General surgery consultation and follow-up appreciated.  CT abdomen: Findings felt consistent with perforated sigmoid colon diverticulitis with localized gas in the vicinity of inflamed sigmoid colon.  There also appears to be a 4.3 cm intramural gas collection at the sigmoid colon suggestive of abscess.  Patient on immunosuppressants prior to admission, currently on hold.  Patient treated conservatively with bowel rest/n.p.o., IV fluids, IV Zosyn and pain management.  Clinically improved and pain has resolved.  However as per surgery follow-up, due to being on immunosuppressants he is at high risk for abscess given intramural air and hence repeated CT abdomen 10/22 which shows persistent perforated sigmoid diverticulitis.  As per surgery follow-up, although CT scan has not shown improvement, patient has remained clinically stable with benign abdominal exam, afebrile and hence have started him on clear liquids, continue IV Zosyn.  Patient reports colonoscopy in the last 5 years by Dr. Henrene Pastor, [facility] that showed diverticulosis.  Lung nodule  On CT abdomen, noted stable  nodular masslike area in the medial right lower lobe, felt benign, this is thought to represent sequestration on prior chest imaging as per radiology.  Outpatient follow-up.  Essential hypertension  Reasonably controlled.  Losartan currently on hold.  Monitor.  Rheumatoid arthritis  Hold methotrexate and [name] on hold due to acute diverticulitis.  Patient reports that he spoke with Dr. Amil Amen, Rheumatology who advised him to hold off on these medications until his acute diverticulitis has completely resolved.  Outpatient follow-up with Dr. Amil Amen  GERD  Continue Pepcid.  Obesity/Body mass index is 36.56 kg/m.   Anemia  Suspect dilutional.  Stable.  Hypoglycemia  Change fluids to D5.  Oral glucose gel as needed.  Monitor CBGs.  No further episodes.  DVT prophylaxis: SCDs.  Will add Lovenox DVT prophylaxis. Code Status: Full Family Communication: Discussed in detail with patient spouse at bedside, updated care and answered questions. Disposition: DC home pending clinical improvement and surgical clearance.  Consultants:  General surgery  Procedures:  None  Antimicrobials:  IV Zosyn   Subjective: Denies abdominal pain.  Multiple loose BMs post contrast without blood.  Feels a little fatigued.  Anxious to start eating something.  Objective:  Vitals:   09/16/19 0605 09/16/19 1501 09/16/19 2024 09/17/19 0623  BP: (!) 166/71 (!) 160/88 (!) 161/76 140/83  Pulse: 69 70 69 78  Resp: 16 18 16 16   Temp: 98.1 F (36.7 C) 98.5 F (36.9 C) 98.4 F (36.9 C) 97.6 F (36.4 C)  TempSrc: Oral Oral  Oral  SpO2: 98% 96% 97% 96%  Weight:      Height:        Examination:  General exam: Pleasant middle-age male, moderately built and obese seen lying  comfortably propped up in bed.  Oral mucosa moist. Respiratory system: Clear to auscultation.  No increased work of breathing. Cardiovascular system: S1 & S2 heard, RRR. No JVD, murmurs, rubs, gallops or clicks. No pedal  edema. Gastrointestinal system: Abdomen is nondistended, soft and nontender.  Normal bowel sounds heard. Central nervous system: Alert and oriented. No focal neurological deficits. Extremities: Symmetric 5 x 5 power. Skin: No rashes, lesions or ulcers Psychiatry: Judgement and insight appear normal. Mood & affect appropriate.     Data Reviewed: I have personally reviewed following labs and imaging studies  CBC: Recent Labs  Lab 09/13/19 1841 09/14/19 0541 09/15/19 0547 09/16/19 0611  WBC 13.1* 9.6 6.7 6.3  NEUTROABS 10.7* 6.1  --   --   HGB 13.0 11.7* 11.9* 11.9*  HCT 40.7 37.0* 37.4* 36.8*  MCV 91.7 94.6 91.9 91.3  PLT 391 320 332 [id number]   Basic Metabolic Panel: Recent Labs  Lab 09/13/19 1841 09/14/19 0541 09/15/19 0547 09/16/19 0611  NA 137 139 138 139  K 4.1 3.7 3.9 3.9  CL 105 106 107 108  CO2 20* 23 19* 21*  GLUCOSE 101* 94 71 91  BUN 18 18 19 12   CREATININE 1.00 1.06 1.14 1.06  CALCIUM 9.0 8.5* 8.1* 8.3*  MG  --   --  2.4  --    Liver Function Tests: Recent Labs  Lab 09/13/19 1841  AST 27  ALT 26  ALKPHOS 50  BILITOT 0.7  PROT 7.8  ALBUMIN 3.3*    Cardiac Enzymes: No results for input(s): CKTOTAL, CKMB, CKMBINDEX, TROPONINI in the last 168 hours.  CBG: Recent Labs  Lab 09/16/19 0616 09/16/19 1046 09/16/19 1637 09/17/19 0625 09/17/19 1037  GLUCAP 87 88 77 83 71    Recent Results (from the past 240 hour(s))  Culture, blood (routine x 2)     Status: None (Preliminary result)   Collection Time: 09/13/19 11:02 PM   Specimen: BLOOD  Result Value Ref Range Status   Specimen Description   Final    BLOOD RIGHT ANTECUBITAL Performed at [facility] [address], [place], [place] [address]    Special Requests   Final    BOTTLES DRAWN AEROBIC AND ANAEROBIC Blood Culture results may not be optimal due to an excessive volume of blood received in culture bottles Performed at [facility] [address]  [place], [place] [address]    Culture   Final    NO GROWTH 3 DAYS Performed at [facility], [place] [address], [place], [place] [address]    Report Status PENDING  Incomplete  Culture, blood (routine x 2)     Status: None (Preliminary result)   Collection Time: 09/13/19 11:07 PM   Specimen: BLOOD  Result Value Ref Range Status   Specimen Description   Final    BLOOD RIGHT HAND Performed at [facility] [address], [place], [place] [address]    Special Requests   Final    BOTTLES DRAWN AEROBIC AND ANAEROBIC Blood Culture adequate volume Performed at [facility] [address] [place], [place] [address]    Culture   Final    NO GROWTH 3 DAYS Performed at [facility], [place] [address] [place], [place] [address]    Report Status PENDING  Incomplete  SARS CORONAVIRUS 2 (TAT 6-24 HRS) Nasopharyngeal Nasopharyngeal Swab     Status: None   Collection Time: 09/13/19 11:25 PM   Specimen:  Nasopharyngeal Swab  Result Value Ref Range Status   SARS Coronavirus 2 NEGATIVE NEGATIVE Final    Comment: (NOTE) SARS-CoV-2 target nucleic acids are NOT DETECTED. The SARS-CoV-2 RNA is generally detectable in upper and lower respiratory specimens during the acute phase of infection. Negative results do not preclude SARS-CoV-2 infection, do not rule out co-infections with other pathogens, and should not be used as the sole basis for treatment or other patient management decisions. Negative results must be combined with clinical observations, patient history, and epidemiological information. The expected result is Negative. Fact Sheet for Patients: SugarRoll.be Fact Sheet for Healthcare Providers: https://www.woods-mathews.com/ This test is not yet approved or cleared by the [place] FDA and  has been authorized for detection and/or diagnosis of SARS-CoV-2 by FDA under an Emergency Use Authorization  (EUA). This EUA will remain  in effect (meaning this test can be used) for the duration of the COVID-19 declaration under Section 56 4(b)(1) of the Act, 21 U.S.C. section 360bbb-3(b)(1), unless the authorization is terminated or revoked sooner. Performed at [facility], [place] [address], [place], [place] [address]          Radiology Studies: Ct Abdomen Pelvis W Contrast  Result Date: 09/16/2019 CLINICAL DATA:  68 year old male with history of abdominal pain. History of perforated diverticulitis. Follow-up study. EXAM: CT ABDOMEN AND PELVIS WITH CONTRAST TECHNIQUE: Multidetector CT imaging of the abdomen and pelvis was performed using the standard protocol following bolus administration of intravenous contrast. CONTRAST:  157mL OMNIPAQUE IOHEXOL 300 MG/ML SOLN, 81mL OMNIPAQUE IOHEXOL 300 MG/ML SOLN COMPARISON:  CT the abdomen and pelvis 09/13/2019. FINDINGS: Lower chest: In the medial aspect of the right lower lobe (axial image 21 of series 4) there is again a mass-like area of architectural distortion which appears similar in size to prior examinations, and appears to be fed by a blood vessel coming off the celiac axis, likely to represent an extralobar pulmonary sequestration. Aortic atherosclerosis. Hepatobiliary: Subcentimeter low-attenuation lesion between segments 2 and 4A, similar to prior studies, incompletely characterized on today's non-contrast CT examination, but statistically likely to represent a cyst. No other suspicious hepatic lesions. No intra or extrahepatic biliary ductal dilatation. Gallbladder is normal in appearance. Pancreas: No pancreatic mass. No pancreatic ductal dilatation. No pancreatic or peripancreatic fluid collections or inflammatory changes. Spleen: Unremarkable. Adrenals/Urinary Tract: Bilateral kidneys and bilateral adrenal glands are normal in appearance. No hydroureteronephrosis. Stomach/Bowel: Normal appearance of the stomach. No pathologic dilatation of  small bowel or colon. Numerous colonic diverticulae are again noted, with extensive inflammatory changes adjacent to the proximal and mid sigmoid colon, including what appears to be extraluminal gas in the sigmoid mesocolon, best appreciated on axial images 61-66 of series 2 and coronal images 36-49 of series 5. Overall, the extent of extraluminal gas and associated inflammation in the sigmoid mesocolon has increased significantly compared to the prior study. Extensive mural thickening throughout the sigmoid colon, with rather diffuse surrounding inflammatory changes and trace amount of reactive adjacent fluid. Minimal thickening of adjacent small bowel loops in the low anatomic pelvis, likely reactive. Normal appendix. Vascular/Lymphatic: Aortic atherosclerosis, without evidence of aneurysm or dissection in the abdominal or pelvic vasculature. No lymphadenopathy noted in the abdomen or pelvis. Reproductive: Prostate gland and seminal vesicles are unremarkable in appearance. Other: No significant volume of ascites.  No pneumoperitoneum. Musculoskeletal: There are no aggressive appearing lytic or blastic lesions noted in the visualized portions of the skeleton. IMPRESSION: 1. Findings remain compatible with perforated sigmoid: Diverticulitis, with increasing  gas and fluid in the sigmoid mesocolon and worsening surrounding inflammatory changes. 2. 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 3. Aortic atherosclerosis. Electronically Signed   By: Vinnie Langton M.D.   On: 09/16/2019 17:46        Scheduled Meds: . sodium chloride flush  3 mL Intravenous Once   Continuous Infusions: . dextrose 5 % and 0.9 % NaCl with KCl 20 mEq/L 75 mL/hr at 09/17/19 0255  . famotidine (PEPCID) IV 20 mg (09/17/19 0849)  . piperacillin-tazobactam (ZOSYN)  IV 3.375 g (09/17/19 0647)     LOS: 4 days     Vernell Leep, MD, FACP, [facility] [place]. [facility]  To contact the  attending provider between 7A-7P or the covering provider during after hours 7P-7A, please log into the web site [email or web address] and access using universal [place] password for that web site. If you do not have the password, please call the hospital operator.  09/17/2019, 12:51 PM

## 2019-09-17 NOTE — Progress Notes (Signed)
Pt given ginger ale to sip on before lunch, as pt is asking to order full liquids for lunch. Pt instructed on clear liquid diet and need to have intake before advancing to the full liquid diet. Pt understands and agreeable. Will continue to monitor.

## 2019-09-17 NOTE — Progress Notes (Signed)
[facility] Progress Note     Subjective: CC-  Feels the same as yesterday, denies abdominal pain or n/v. He is now having loose, non-bloody stools since drinking contrast for CT scan. Feels hungry. CT showed persistent perforated sigmoid diverticulitis with increasing gas and fluid in the sigmoid mesocolon and worsening surrounding inflammatory changes. Afebrile, VSS.  Objective: Vital signs in last 24 hours: Temp:  [97.6 F (36.4 C)-98.5 F (36.9 C)] 97.6 F (36.4 C) (10/23 0623) Pulse Rate:  [69-78] 78 (10/23 0623) Resp:  [16-18] 16 (10/23 0623) BP: (140-161)/(76-88) 140/83 (10/23 0623) SpO2:  [96 %-97 %] 96 % (10/23 0623) Last BM Date: 09/16/19  Intake/Output from previous day: No intake/output data recorded. Intake/Output this shift: No intake/output data recorded.  PE: Gen:  Alert, NAD, pleasant HEENT: EOM's intact, pupils equal and round Card:  RRR Pulm:  Rate and effort normal Abd: Soft, NT/ND, +BS, no HSM, no hernia Psych: A&Ox3  Skin: warm and dry  Lab Results:  Recent Labs    09/15/19 0547 09/16/19 0611  WBC 6.7 6.3  HGB 11.9* 11.9*  HCT 37.4* 36.8*  PLT 332 362   BMET Recent Labs    09/15/19 0547 09/16/19 0611  NA 138 139  K 3.9 3.9  CL 107 108  CO2 19* 21*  GLUCOSE 71 91  BUN 19 12  CREATININE 1.14 1.06  CALCIUM 8.1* 8.3*   PT/INR No results for input(s): LABPROT, INR in the last 72 hours. CMP     Component Value Date/Time   NA 139 09/16/2019 0611   K 3.9 09/16/2019 0611   CL 108 09/16/2019 0611   CO2 21 (L) 09/16/2019 0611   GLUCOSE 91 09/16/2019 0611   BUN 12 09/16/2019 0611   CREATININE 1.06 09/16/2019 0611   CALCIUM 8.3 (L) 09/16/2019 0611   PROT 7.8 09/13/2019 1841   ALBUMIN 3.3 (L) 09/13/2019 1841   AST 27 09/13/2019 1841   ALT 26 09/13/2019 1841   ALKPHOS 50 09/13/2019 1841   BILITOT 0.7 09/13/2019 1841   GFRNONAA >60 09/16/2019 0611   GFRAA >60 09/16/2019 0611   Lipase     Component Value Date/Time    LIPASE 23 09/13/2019 1841       Studies/Results: Ct Abdomen Pelvis W Contrast  Result Date: 09/16/2019 CLINICAL DATA:  68 year old male with history of abdominal pain. History of perforated diverticulitis. Follow-up study. EXAM: CT ABDOMEN AND PELVIS WITH CONTRAST TECHNIQUE: Multidetector CT imaging of the abdomen and pelvis was performed using the standard protocol following bolus administration of intravenous contrast. CONTRAST:  135mL OMNIPAQUE IOHEXOL 300 MG/ML SOLN, 22mL OMNIPAQUE IOHEXOL 300 MG/ML SOLN COMPARISON:  CT the abdomen and pelvis 09/13/2019. FINDINGS: Lower chest: In the medial aspect of the right lower lobe (axial image 21 of series 4) there is again a mass-like area of architectural distortion which appears similar in size to prior examinations, and appears to be fed by a blood vessel coming off the celiac axis, likely to represent an extralobar pulmonary sequestration. Aortic atherosclerosis. Hepatobiliary: Subcentimeter low-attenuation lesion between segments 2 and 4A, similar to prior studies, incompletely characterized on today's non-contrast CT examination, but statistically likely to represent a cyst. No other suspicious hepatic lesions. No intra or extrahepatic biliary ductal dilatation. Gallbladder is normal in appearance. Pancreas: No pancreatic mass. No pancreatic ductal dilatation. No pancreatic or peripancreatic fluid collections or inflammatory changes. Spleen: Unremarkable. Adrenals/Urinary Tract: Bilateral kidneys and bilateral adrenal glands are normal in appearance. No hydroureteronephrosis. Stomach/Bowel: Normal appearance of the  stomach. No pathologic dilatation of small bowel or colon. Numerous colonic diverticulae are again noted, with extensive inflammatory changes adjacent to the proximal and mid sigmoid colon, including what appears to be extraluminal gas in the sigmoid mesocolon, best appreciated on axial images 61-66 of series 2 and coronal images 36-49 of  series 5. Overall, the extent of extraluminal gas and associated inflammation in the sigmoid mesocolon has increased significantly compared to the prior study. Extensive mural thickening throughout the sigmoid colon, with rather diffuse surrounding inflammatory changes and trace amount of reactive adjacent fluid. Minimal thickening of adjacent small bowel loops in the low anatomic pelvis, likely reactive. Normal appendix. Vascular/Lymphatic: Aortic atherosclerosis, without evidence of aneurysm or dissection in the abdominal or pelvic vasculature. No lymphadenopathy noted in the abdomen or pelvis. Reproductive: Prostate gland and seminal vesicles are unremarkable in appearance. Other: No significant volume of ascites.  No pneumoperitoneum. Musculoskeletal: There are no aggressive appearing lytic or blastic lesions noted in the visualized portions of the skeleton. IMPRESSION: 1. Findings remain compatible with perforated sigmoid: Diverticulitis, with increasing gas and fluid in the sigmoid mesocolon and worsening surrounding inflammatory changes. 2. 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 3. Aortic atherosclerosis. Electronically Signed   By: Vinnie Langton M.D.   On: 09/16/2019 17:46    Anti-infectives: Anti-infectives (From admission, onward)   Start     Dose/Rate Route Frequency Ordered Stop   09/14/19 0600  piperacillin-tazobactam (ZOSYN) IVPB 3.375 g     3.375 g 12.5 mL/hr over 240 Minutes Intravenous Every 8 hours 09/14/19 0308     09/13/19 2315  piperacillin-tazobactam (ZOSYN) IVPB 3.375 g     3.375 g 100 mL/hr over 30 Minutes Intravenous  Once 09/13/19 2303 09/14/19 0027       Assessment/Plan Rheumatoid arthritis-on [name]9/27-last dose;methotrexate10/13 - last dose Hypertension Obstructive sleep apnea-does not use CPAP BMI ~34  Sigmoid diverticulitis with microperforation - CT 10/19 showed 4.3 x 2.2cmsuspected intramural gas  collection, multiple foci of extraluminal gas within the surrounding pelvis with inflammation; negative appendix - follow CT 10/22 persistent perforated sigmoid diverticulitis with increasing gas and fluid in the sigmoid mesocolon and worsening surrounding inflammatory changes  FEN: IV fluids, CLD [id number] 10/19 >> Day4 DVT: SCD's, per primary/ ok for chemical DVT prophylaxis from surgical standpoint Follow up: TBD  Plan: Although his CT scan does not look great, the patient remains clinically stable with a benign abdominal exam, afebrile, and VSS. Will let him try clear liquids today. Continue IV zosyn.    LOS: 4 days    [facility] 09/17/2019, 10:06 AM Please see Amion for pager number during day hours 7:00am-4:30pm

## 2019-09-18 LAB — CBC
HCT: 36.1 % — ABNORMAL LOW (ref 39.0–52.0)
Hemoglobin: 12.1 g/dL — ABNORMAL LOW (ref 13.0–17.0)
MCH: 30 pg (ref 26.0–34.0)
MCHC: 33.5 g/dL (ref 30.0–36.0)
MCV: 89.4 fL (ref 80.0–100.0)
Platelets: 290 10*3/uL (ref 150–400)
RBC: 4.04 MIL/uL — ABNORMAL LOW (ref 4.22–5.81)
RDW: 12.1 % (ref 11.5–15.5)
WBC: 8 10*3/uL (ref 4.0–10.5)
nRBC: 0 % (ref 0.0–0.2)

## 2019-09-18 LAB — GLUCOSE, CAPILLARY
Glucose-Capillary: 127 mg/dL — ABNORMAL HIGH (ref 70–99)
Glucose-Capillary: 100 mg/dL — ABNORMAL HIGH (ref 70–99)
Glucose-Capillary: 132 mg/dL — ABNORMAL HIGH (ref 70–99)

## 2019-09-18 MED ORDER — SODIUM CHLORIDE 0.9 % IV BOLUS
1000.0000 mL | Freq: Once | INTRAVENOUS | Status: AC
  Administered 2019-09-18: 1000 mL via INTRAVENOUS

## 2019-09-18 MED ORDER — LOSARTAN POTASSIUM 50 MG PO TABS
50.0000 mg | ORAL_TABLET | Freq: Every day | ORAL | Status: DC
  Administered 2019-09-18: 50 mg via ORAL
  Filled 2019-09-18: qty 1

## 2019-09-18 NOTE — Progress Notes (Signed)
[facility] Progress Note     Subjective: CC-  No pain or nausea.  No fevers.  Tolerated full liquids.    Objective: Vital signs in last 24 hours: Temp:  [97.7 F (36.5 C)-98.3 F (36.8 C)] 97.7 F (36.5 C) (10/24 0622) Pulse Rate:  [67-82] 67 (10/24 0622) Resp:  [17-18] 17 (10/24 0622) BP: (146-156)/(72-83) 156/83 (10/24 0622) SpO2:  [95 %-99 %] 98 % (10/24 0622) Weight:  [96.4 kg] 96.4 kg (10/24 0622) Last BM Date: 09/17/19  Intake/Output from previous day: 10/23 0701 - 10/24 0700 In: 2162.5 [P.O.:240; I.V.:1572.5; IV Piggyback:350] Out: -  Intake/Output this shift: No intake/output data recorded.  PE: Gen:  Alert, NAD, pleasant HEENT: EOM's intact, pupils equal and round Pulm:  Rate and effort normal Abd: Soft, NT/ND Psych: A&Ox3  Skin: warm and dry  Lab Results:  Recent Labs    09/16/19 0611 09/18/19 0611  WBC 6.3 8.0  HGB 11.9* 12.1*  HCT 36.8* 36.1*  PLT 362 290   BMET Recent Labs    09/16/19 0611  NA 139  K 3.9  CL 108  CO2 21*  GLUCOSE 91  BUN 12  CREATININE 1.06  CALCIUM 8.3*   PT/INR No results for input(s): LABPROT, INR in the last 72 hours. CMP     Component Value Date/Time   NA 139 09/16/2019 0611   K 3.9 09/16/2019 0611   CL 108 09/16/2019 0611   CO2 21 (L) 09/16/2019 0611   GLUCOSE 91 09/16/2019 0611   BUN 12 09/16/2019 0611   CREATININE 1.06 09/16/2019 0611   CALCIUM 8.3 (L) 09/16/2019 0611   PROT 7.8 09/13/2019 1841   ALBUMIN 3.3 (L) 09/13/2019 1841   AST 27 09/13/2019 1841   ALT 26 09/13/2019 1841   ALKPHOS 50 09/13/2019 1841   BILITOT 0.7 09/13/2019 1841   GFRNONAA >60 09/16/2019 0611   GFRAA >60 09/16/2019 0611   Lipase     Component Value Date/Time   LIPASE 23 09/13/2019 1841       Studies/Results: Ct Abdomen Pelvis W Contrast  Result Date: 09/16/2019 CLINICAL DATA:  68 year old male with history of abdominal pain. History of perforated diverticulitis. Follow-up study. EXAM: CT ABDOMEN AND  PELVIS WITH CONTRAST TECHNIQUE: Multidetector CT imaging of the abdomen and pelvis was performed using the standard protocol following bolus administration of intravenous contrast. CONTRAST:  176mL OMNIPAQUE IOHEXOL 300 MG/ML SOLN, 68mL OMNIPAQUE IOHEXOL 300 MG/ML SOLN COMPARISON:  CT the abdomen and pelvis 09/13/2019. FINDINGS: Lower chest: In the medial aspect of the right lower lobe (axial image 21 of series 4) there is again a mass-like area of architectural distortion which appears similar in size to prior examinations, and appears to be fed by a blood vessel coming off the celiac axis, likely to represent an extralobar pulmonary sequestration. Aortic atherosclerosis. Hepatobiliary: Subcentimeter low-attenuation lesion between segments 2 and 4A, similar to prior studies, incompletely characterized on today's non-contrast CT examination, but statistically likely to represent a cyst. No other suspicious hepatic lesions. No intra or extrahepatic biliary ductal dilatation. Gallbladder is normal in appearance. Pancreas: No pancreatic mass. No pancreatic ductal dilatation. No pancreatic or peripancreatic fluid collections or inflammatory changes. Spleen: Unremarkable. Adrenals/Urinary Tract: Bilateral kidneys and bilateral adrenal glands are normal in appearance. No hydroureteronephrosis. Stomach/Bowel: Normal appearance of the stomach. No pathologic dilatation of small bowel or colon. Numerous colonic diverticulae are again noted, with extensive inflammatory changes adjacent to the proximal and mid sigmoid colon, including what appears to be extraluminal gas  in the sigmoid mesocolon, best appreciated on axial images 61-66 of series 2 and coronal images 36-49 of series 5. Overall, the extent of extraluminal gas and associated inflammation in the sigmoid mesocolon has increased significantly compared to the prior study. Extensive mural thickening throughout the sigmoid colon, with rather diffuse surrounding  inflammatory changes and trace amount of reactive adjacent fluid. Minimal thickening of adjacent small bowel loops in the low anatomic pelvis, likely reactive. Normal appendix. Vascular/Lymphatic: Aortic atherosclerosis, without evidence of aneurysm or dissection in the abdominal or pelvic vasculature. No lymphadenopathy noted in the abdomen or pelvis. Reproductive: Prostate gland and seminal vesicles are unremarkable in appearance. Other: No significant volume of ascites.  No pneumoperitoneum. Musculoskeletal: There are no aggressive appearing lytic or blastic lesions noted in the visualized portions of the skeleton. IMPRESSION: 1. Findings remain compatible with perforated sigmoid: Diverticulitis, with increasing gas and fluid in the sigmoid mesocolon and worsening surrounding inflammatory changes. 2. 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 3. Aortic atherosclerosis. Electronically Signed   By: Vinnie Langton M.D.   On: 09/16/2019 17:46    Anti-infectives: Anti-infectives (From admission, onward)   Start     Dose/Rate Route Frequency Ordered Stop   09/14/19 0600  piperacillin-tazobactam (ZOSYN) IVPB 3.375 g     3.375 g 12.5 mL/hr over 240 Minutes Intravenous Every 8 hours 09/14/19 0308     09/13/19 2315  piperacillin-tazobactam (ZOSYN) IVPB 3.375 g     3.375 g 100 mL/hr over 30 Minutes Intravenous  Once 09/13/19 2303 09/14/19 0027       Assessment/Plan Rheumatoid arthritis-on [name]9/27-last dose;methotrexate10/13 - last dose Hypertension Obstructive sleep apnea-does not use CPAP BMI ~34  Sigmoid diverticulitis with microperforation - CT 10/19 showed 4.3 x 2.2cmsuspected intramural gas collection, multiple foci of extraluminal gas within the surrounding pelvis with inflammation; negative appendix - follow CT 10/22 persistent perforated sigmoid diverticulitis with increasing gas and fluid in the sigmoid mesocolon and worsening  surrounding inflammatory changes  FEN: decrease IV fluids,a dvance diet.   [id number] 10/19 >> Day5 DVT: SCD's, per primary/ ok for chemical DVT prophylaxis from surgical standpoint Follow up: TBD  Plan: Although his CT scan does not look great, the patient remains clinically stable with a benign abdominal exam, afebrile, and VSS. Continue IV zosyn. Advance to soft diet today.   Hope to d/c tomorrow with plans to follow up as outpatient. Stay off humira and MTX.     LOS: 5 days   Milus Height, MD FACS Surgical Oncology, General Surgery, Trauma and [facility], [email or web address] for call schedule

## 2019-09-18 NOTE — Progress Notes (Signed)
PROGRESS NOTE   Nathaniel [name]  [id number]    DOB: [birth year]    DOA: 09/13/2019  PCP: Jani Gravel, MD   I have briefly reviewed patients previous medical records in [facility].  Chief Complaint  Patient presents with  . Abdominal Pain    Brief Narrative:  68 year old married male with PMH of rheumatoid arthritis, essential hypertension, OSA, fatty liver, diverticulosis, GERD, presented to ED due to abdominal pain and fevers.  CT abdomen showed findings consistent with perforated sigmoid colon diverticulitis and intramural abscess.  General surgery consulted and managing conservatively.  Continues to gradually improve.  Possible discharge home 10/25.   Assessment & Plan:   Principal Problem:   Diverticulitis of large intestine with perforation Active Problems:   Rheumatoid arthritis ([place])   Essential hypertension   Sigmoid diverticulitis with microperforation  General surgery consultation and follow-up appreciated.  CT abdomen: Findings felt consistent with perforated sigmoid colon diverticulitis with localized gas in the vicinity of inflamed sigmoid colon.  There also appears to be a 4.3 cm intramural gas collection at the sigmoid colon suggestive of abscess.  Patient on immunosuppressants prior to admission, currently on hold.  Patient treated conservatively with bowel rest/n.p.o., IV fluids, IV Zosyn and pain management.  Clinically improved and pain has resolved.  However as per surgery follow-up, due to being on immunosuppressants he is at high risk for abscess given intramural air and hence repeated CT abdomen 10/22 which shows persistent perforated sigmoid diverticulitis.  As per surgery follow-up, although CT scan has not shown improvement, patient has remained clinically stable with benign abdominal exam, afebrile and hence have started him on clear liquids, continue IV Zosyn.  Patient reports colonoscopy in the last 5 years by Dr. Henrene Pastor, [facility] that  showed diverticulosis.   Patient tolerated clear liquids.  Diet advanced to soft diet.  IV fluids changed to [facility].  I discussed with Dr. Barry Dienes.  Hopefully discharge on 10/25 and will arrange outpatient follow-up with colorectal surgery.  Lung nodule  On CT abdomen, noted stable nodular masslike area in the medial right lower lobe, felt benign, this is thought to represent sequestration on prior chest imaging as per radiology.  Outpatient follow-up.  Essential hypertension  Reasonably controlled.  Losartan currently on hold.  Monitor.  May resume antihypertensives at discharge.  Rheumatoid arthritis  Hold methotrexate and [name] on hold due to acute diverticulitis.  Patient reports that he spoke with Dr. Amil Amen, Rheumatology who advised him to hold off on these medications until his acute diverticulitis has completely resolved.  Outpatient follow-up with Dr. Amil Amen  GERD  Continue Pepcid.  Obesity/Body mass index is 33.28 kg/m.   Anemia  Suspect dilutional.  Stable.  Hypoglycemia  Change fluids to D5.  Oral glucose gel as needed.  Monitor CBGs.  No further episodes.  DVT prophylaxis: SCDs and Lovenox Code Status: Full Family Communication: None at bedside today Disposition: DC home possibly 10/25  Consultants:  General surgery  Procedures:  None  Antimicrobials:  IV Zosyn   Subjective: Tolerated liquid diet.  Had a couple of stools in the last 1 reportedly formed.  No abdominal pain, nausea or vomiting.  Objective:  Vitals:   09/17/19 0623 09/17/19 1326 09/17/19 2108 09/18/19 0622  BP: 140/83 (!) 146/78 (!) 150/72 (!) 156/83  Pulse: 78 82 68 67  Resp: 16 18 17 17   Temp: 97.6 F (36.4 C) 98.3 F (36.8 C) 98.2 F (36.8 C) 97.7 F (36.5 C)  TempSrc: Oral Oral  Oral Oral  SpO2: 96% 99% 95% 98%  Weight:    96.4 kg  Height:        Examination:  General exam: Pleasant middle-age male, moderately built and obese seen ambulating comfortably in the  room Respiratory system: Clear to auscultation.  No increased work of breathing. Cardiovascular system: S1 & S2 heard, RRR. No JVD, murmurs, rubs, gallops or clicks. No pedal edema. Gastrointestinal system: Abdomen is nondistended, soft and nontender.  Normal bowel sounds heard. Central nervous system: Alert and oriented. No focal neurological deficits. Extremities: Symmetric 5 x 5 power. Skin: No rashes, lesions or ulcers Psychiatry: Judgement and insight appear normal. Mood & affect appropriate.     Data Reviewed: I have personally reviewed following labs and imaging studies  CBC: Recent Labs  Lab 09/13/19 1841 09/14/19 0541 09/15/19 0547 09/16/19 0611 09/18/19 0611  WBC 13.1* 9.6 6.7 6.3 8.0  NEUTROABS 10.7* 6.1  --   --   --   HGB 13.0 11.7* 11.9* 11.9* 12.1*  HCT 40.7 37.0* 37.4* 36.8* 36.1*  MCV 91.7 94.6 91.9 91.3 89.4  PLT 391 320 332 362 [id number]   Basic Metabolic Panel: Recent Labs  Lab 09/13/19 1841 09/14/19 0541 09/15/19 0547 09/16/19 0611  NA 137 139 138 139  K 4.1 3.7 3.9 3.9  CL 105 106 107 108  CO2 20* 23 19* 21*  GLUCOSE 101* 94 71 91  BUN 18 18 19 12   CREATININE 1.00 1.06 1.14 1.06  CALCIUM 9.0 8.5* 8.1* 8.3*  MG  --   --  2.4  --    Liver Function Tests: Recent Labs  Lab 09/13/19 1841  AST 27  ALT 26  ALKPHOS 50  BILITOT 0.7  PROT 7.8  ALBUMIN 3.3*    Cardiac Enzymes: No results for input(s): CKTOTAL, CKMB, CKMBINDEX, TROPONINI in the last 168 hours.  CBG: Recent Labs  Lab 09/17/19 0625 09/17/19 1037 09/17/19 1623 09/18/19 0607 09/18/19 1138  GLUCAP 83 71 123* 127* 100*    Recent Results (from the past 240 hour(s))  Culture, blood (routine x 2)     Status: None (Preliminary result)   Collection Time: 09/13/19 11:02 PM   Specimen: BLOOD  Result Value Ref Range Status   Specimen Description   Final    BLOOD RIGHT ANTECUBITAL Performed at [facility] [address] [place], [place] [address]    Special  Requests   Final    BOTTLES DRAWN AEROBIC AND ANAEROBIC Blood Culture results may not be optimal due to an excessive volume of blood received in culture bottles Performed at [facility] [address] [place], [place] [address]    Culture   Final    NO GROWTH 4 DAYS Performed at [facility], [place] [address], [place], [place] [address]    Report Status PENDING  Incomplete  Culture, blood (routine x 2)     Status: None (Preliminary result)   Collection Time: 09/13/19 11:07 PM   Specimen: BLOOD  Result Value Ref Range Status   Specimen Description   Final    BLOOD RIGHT HAND Performed at [facility] [address], [place], [place] [address]    Special Requests   Final    BOTTLES DRAWN AEROBIC AND ANAEROBIC Blood Culture adequate volume Performed at [facility] [address], [place], [place] [address]    Culture   Final    NO GROWTH 4 DAYS Performed at [facility], [address], [place], [place] [address]    Report Status PENDING  Incomplete  SARS CORONAVIRUS 2 (TAT 6-24 HRS) Nasopharyngeal Nasopharyngeal Swab     Status: None   Collection Time: 09/13/19 11:25 PM   Specimen: Nasopharyngeal Swab  Result Value Ref Range Status   SARS Coronavirus 2 NEGATIVE NEGATIVE Final    Comment: (NOTE) SARS-CoV-2 target nucleic acids are NOT DETECTED. The SARS-CoV-2 RNA is generally detectable in upper and lower respiratory specimens during the acute phase of infection. Negative results do not preclude SARS-CoV-2 infection, do not rule out co-infections with other pathogens, and should not be used as the sole basis for treatment or other patient management decisions. Negative results must be combined with clinical observations, patient history, and epidemiological information. The expected result is Negative. Fact Sheet for Patients: SugarRoll.be Fact Sheet for Healthcare Providers:  https://www.woods-mathews.com/ This test is not yet approved or cleared by the [place] FDA and  has been authorized for detection and/or diagnosis of SARS-CoV-2 by FDA under an Emergency Use Authorization (EUA). This EUA will remain  in effect (meaning this test can be used) for the duration of the COVID-19 declaration under Section 56 4(b)(1) of the Act, 21 U.S.C. section 360bbb-3(b)(1), unless the authorization is terminated or revoked sooner. Performed at [facility], [place] [address], [place], [place] [address]          Radiology Studies: Ct Abdomen Pelvis W Contrast  Result Date: 09/16/2019 CLINICAL DATA:  68 year old male with history of abdominal pain. History of perforated diverticulitis. Follow-up study. EXAM: CT ABDOMEN AND PELVIS WITH CONTRAST TECHNIQUE: Multidetector CT imaging of the abdomen and pelvis was performed using the standard protocol following bolus administration of intravenous contrast. CONTRAST:  168mL OMNIPAQUE IOHEXOL 300 MG/ML SOLN, 35mL OMNIPAQUE IOHEXOL 300 MG/ML SOLN COMPARISON:  CT the abdomen and pelvis 09/13/2019. FINDINGS: Lower chest: In the medial aspect of the right lower lobe (axial image 21 of series 4) there is again a mass-like area of architectural distortion which appears similar in size to prior examinations, and appears to be fed by a blood vessel coming off the celiac axis, likely to represent an extralobar pulmonary sequestration. Aortic atherosclerosis. Hepatobiliary: Subcentimeter low-attenuation lesion between segments 2 and 4A, similar to prior studies, incompletely characterized on today's non-contrast CT examination, but statistically likely to represent a cyst. No other suspicious hepatic lesions. No intra or extrahepatic biliary ductal dilatation. Gallbladder is normal in appearance. Pancreas: No pancreatic mass. No pancreatic ductal dilatation. No pancreatic or peripancreatic fluid collections or inflammatory  changes. Spleen: Unremarkable. Adrenals/Urinary Tract: Bilateral kidneys and bilateral adrenal glands are normal in appearance. No hydroureteronephrosis. Stomach/Bowel: Normal appearance of the stomach. No pathologic dilatation of small bowel or colon. Numerous colonic diverticulae are again noted, with extensive inflammatory changes adjacent to the proximal and mid sigmoid colon, including what appears to be extraluminal gas in the sigmoid mesocolon, best appreciated on axial images 61-66 of series 2 and coronal images 36-49 of series 5. Overall, the extent of extraluminal gas and associated inflammation in the sigmoid mesocolon has increased significantly compared to the prior study. Extensive mural thickening throughout the sigmoid colon, with rather diffuse surrounding inflammatory changes and trace amount of reactive adjacent fluid. Minimal thickening of adjacent small bowel loops in the low anatomic pelvis, likely reactive. Normal appendix. Vascular/Lymphatic: Aortic atherosclerosis, without evidence of aneurysm or dissection in the abdominal or pelvic vasculature. No lymphadenopathy noted in the abdomen or pelvis. Reproductive: Prostate gland and seminal vesicles  are unremarkable in appearance. Other: No significant volume of ascites.  No pneumoperitoneum. Musculoskeletal: There are no aggressive appearing lytic or blastic lesions noted in the visualized portions of the skeleton. IMPRESSION: 1. Findings remain compatible with perforated sigmoid: Diverticulitis, with increasing gas and fluid in the sigmoid mesocolon and worsening surrounding inflammatory changes. 2. 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 3. Aortic atherosclerosis. Electronically Signed   By: Vinnie Langton M.D.   On: 09/16/2019 17:46        Scheduled Meds: . enoxaparin (LOVENOX) injection  50 mg Subcutaneous Q24H  . sodium chloride flush  3 mL Intravenous Once   Continuous  Infusions: . dextrose 5 % and 0.9 % NaCl with KCl 20 mEq/L 10 mL/hr at 09/18/19 0924  . famotidine (PEPCID) IV 20 mg (09/18/19 0924)  . piperacillin-tazobactam (ZOSYN)  IV 3.375 g (09/18/19 0530)     LOS: 5 days     Vernell Leep, MD, FACP, [facility]. [facility]  To contact the attending provider between 7A-7P or the covering provider during after hours 7P-7A, please log into the web site [email or web address] and access using universal [place] password for that web site. If you do not have the password, please call the hospital operator.  09/18/2019, 1:02 PM

## 2019-09-19 LAB — GLUCOSE, CAPILLARY
Glucose-Capillary: 102 mg/dL — ABNORMAL HIGH (ref 70–99)
Glucose-Capillary: 181 mg/dL — ABNORMAL HIGH (ref 70–99)

## 2019-09-19 LAB — CULTURE, BLOOD (ROUTINE X 2)
Culture: NO GROWTH
Culture: NO GROWTH
Special Requests: ADEQUATE

## 2019-09-19 MED ORDER — AMOXICILLIN-POT CLAVULANATE 875-125 MG PO TABS: 1.0000 | ORAL_TABLET | Freq: Two times a day (BID) | ORAL | 1 refills | Status: AC

## 2019-09-19 NOTE — Progress Notes (Signed)
Discharge instructions reviewed with patient utilizing teach back method no questions at this time. Patient discharged to home. ?

## 2019-09-19 NOTE — Discharge Summary (Signed)
Physician Discharge Summary  Nathaniel [name] [id number] DOB: 02/14/1951  PCP: Jani Gravel, MD  Admitted from: Home Discharged to: Home  Admit date: 09/13/2019 Discharge date: 09/19/2019  Recommendations for Outpatient Follow-up:   Follow-up Information    [facility], [place] Follow up in 2 week(s).   Specialty: General Surgery Why: They should call you for an appointment within a few days.  Call our office if you haven't gotten an appointment by then.   Contact information: [place] [address] [phone]        Jani Gravel, MD. Schedule an appointment as soon as possible for a visit in 1 week(s).   Specialty: Internal Medicine Why: To be seen with repeat labs (CBC & BMP). Contact information: [place] [address] [phone]        Hennie Duos, MD. Schedule an appointment as soon as possible for a visit.   Specialty: Rheumatology Contact information: [place] [address] [phone]          Recommend repeating urine microscopy in 2 to 3 weeks and if has persisting microscopic hematuria, consider further outpatient evaluation which may include Urology consultation.  Home Health: None Equipment/Devices: None  Discharge Condition: Improved and stable CODE STATUS: Full Diet recommendation: Heart healthy soft diet.  Discharge Diagnoses:  Principal Problem:   Diverticulitis of large intestine with perforation Active Problems:   Rheumatoid arthritis ([place])   Essential hypertension   Brief Summary: 68 year old married male with PMH of rheumatoid arthritis, essential hypertension, OSA, fatty liver, diverticulosis, GERD, presented to ED due to abdominal pain and fevers.  CT abdomen showed findings consistent with perforated sigmoid colon diverticulitis and intramural abscess.  General surgery consulted and managed conservatively.   Assessment & Plan:    Sigmoid diverticulitis with microperforation  General surgery consulted and directed care.  CT abdomen: Findings felt consistent with perforated sigmoid colon diverticulitis with localized gas in the vicinity of inflamed sigmoid colon.  There also appears to be a 4.3 cm intramural gas collection at the sigmoid colon suggestive of abscess.  Patient on immunosuppressants prior to admission, currently on hold.  Patient treated conservatively with bowel rest/n.p.o., IV fluids, IV Zosyn (completed 6 days course) and pain management.  Clinically improved and pain has resolved.  However, due to being on immunosuppressants he is at high risk for abscess given intramural air and hence repeated CT abdomen 10/22 which shows persistent perforated sigmoid diverticulitis.  Diet was gradually advanced.  He tolerated soft diet without symptoms.  Loose stools improving.  I discussed with Dr. Barry Dienes who has seen him today and cleared him for discharge home.  He will be on oral Augmentin for additional 3 weeks, soft diet, immunosuppressants on hold until outpatient follow-up with his providers.  Patient reports colonoscopy in the last 5 years by Dr. Henrene Pastor, [facility] that showed diverticulosis.   Lung nodule  On CT abdomen, noted stable nodular masslike area in the medial right lower lobe, felt benign, this is thought to represent sequestration on prior chest imaging as per radiology.  Outpatient follow-up.  Essential hypertension  Continue losartan.  Reasonably controlled.  Rheumatoid arthritis  Hold methotrexate and [name] on hold due to acute diverticulitis.  Patient reports that he spoke with Dr. Amil Amen, Rheumatology who advised him to hold off on these medications until his acute diverticulitis has completely resolved.  Outpatient follow-up with Dr. Amil Amen  GERD  Asymptomatic.  Not  on medications PTA.  Obesity/Body mass index is 33.28 kg/m.   Anemia  Suspect dilutional.   Stable.  Hypoglycemia  Likely due to poor oral intake while NPO.  Resolved.  Microscopic hematuria  Urine microscopy 10/19 showed 21-50 RBC/hpf.  No urinary symptoms reported.  Unclear etiology.  Recommend repeating urine microscopy in a couple of weeks and if this persists then recommend further evaluation which may include Urology consultation.   Consultants:  General surgery  Procedures:  None   Discharge Instructions  Discharge Instructions    Activity as tolerated - No restrictions   Complete by: As directed    Call MD for:  difficulty breathing, headache or visual disturbances   Complete by: As directed    Call MD for:  extreme fatigue   Complete by: As directed    Call MD for:  persistant dizziness or light-headedness   Complete by: As directed    Call MD for:  persistant nausea and vomiting   Complete by: As directed    Call MD for:  severe uncontrolled pain   Complete by: As directed    Call MD for:  temperature >100.4   Complete by: As directed    Diet - low sodium heart healthy   Complete by: As directed        Medication List    STOP taking these medications   clonazePAM 0.5 MG tablet Commonly known as: KLONOPIN   fluticasone 50 MCG/ACT nasal spray Commonly known as: FLONASE   [name] Pen 40 MG/0.8ML injection Generic drug: adalimumab   hydrocortisone 2.5 % rectal cream Commonly known as: Proctosol HC   methotrexate 2.5 MG tablet Commonly known as: RHEUMATREX     TAKE these medications   amoxicillin-clavulanate 875-125 MG tablet Commonly known as: Augmentin Take 1 tablet by mouth 2 (two) times daily for 21 days. Notes to patient: 09/19/19   cholecalciferol 1000 units tablet Commonly known as: VITAMIN D Take 1,000 Units by mouth daily after breakfast. Notes to patient: [id number]   folic acid 1 MG tablet Commonly known as: FOLVITE Take 1 mg by mouth daily after breakfast. Notes to patient: 09/20/19   losartan 100 MG  tablet Commonly known as: COZAAR Take 50 mg by mouth daily after breakfast. Notes to patient: 09/19/19   naproxen sodium 220 MG tablet Commonly known as: ALEVE Take 440 mg by mouth 2 (two) times daily as needed (pain). Notes to patient: 09/19/19   vitamin E 400 UNIT capsule Take 400 Units by mouth daily after breakfast. Notes to patient: 09/19/19      No Known Allergies    Procedures/Studies: Ct Abdomen Pelvis W Contrast  Result Date: 09/16/2019 CLINICAL DATA:  68 year old male with history of abdominal pain. History of perforated diverticulitis. Follow-up study. EXAM: CT ABDOMEN AND PELVIS WITH CONTRAST TECHNIQUE: Multidetector CT imaging of the abdomen and pelvis was performed using the standard protocol following bolus administration of intravenous contrast. CONTRAST:  172mL OMNIPAQUE IOHEXOL 300 MG/ML SOLN, 40mL OMNIPAQUE IOHEXOL 300 MG/ML SOLN COMPARISON:  CT the abdomen and pelvis 09/13/2019. FINDINGS: Lower chest: In the medial aspect of the right lower lobe (axial image 21 of series 4) there is again a mass-like area of architectural distortion which appears similar in size to prior examinations, and appears to be fed by a blood vessel coming off the celiac axis, likely to represent an extralobar pulmonary sequestration. Aortic atherosclerosis. Hepatobiliary: Subcentimeter low-attenuation lesion between segments 2 and 4A, similar to prior studies, incompletely characterized on today's non-contrast CT  examination, but statistically likely to represent a cyst. No other suspicious hepatic lesions. No intra or extrahepatic biliary ductal dilatation. Gallbladder is normal in appearance. Pancreas: No pancreatic mass. No pancreatic ductal dilatation. No pancreatic or peripancreatic fluid collections or inflammatory changes. Spleen: Unremarkable. Adrenals/Urinary Tract: Bilateral kidneys and bilateral adrenal glands are normal in appearance. No hydroureteronephrosis. Stomach/Bowel: Normal  appearance of the stomach. No pathologic dilatation of small bowel or colon. Numerous colonic diverticulae are again noted, with extensive inflammatory changes adjacent to the proximal and mid sigmoid colon, including what appears to be extraluminal gas in the sigmoid mesocolon, best appreciated on axial images 61-66 of series 2 and coronal images 36-49 of series 5. Overall, the extent of extraluminal gas and associated inflammation in the sigmoid mesocolon has increased significantly compared to the prior study. Extensive mural thickening throughout the sigmoid colon, with rather diffuse surrounding inflammatory changes and trace amount of reactive adjacent fluid. Minimal thickening of adjacent small bowel loops in the low anatomic pelvis, likely reactive. Normal appendix. Vascular/Lymphatic: Aortic atherosclerosis, without evidence of aneurysm or dissection in the abdominal or pelvic vasculature. No lymphadenopathy noted in the abdomen or pelvis. Reproductive: Prostate gland and seminal vesicles are unremarkable in appearance. Other: No significant volume of ascites.  No pneumoperitoneum. Musculoskeletal: There are no aggressive appearing lytic or blastic lesions noted in the visualized portions of the skeleton. IMPRESSION: 1. Findings remain compatible with perforated sigmoid: Diverticulitis, with increasing gas and fluid in the sigmoid mesocolon and worsening surrounding inflammatory changes. 2. 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 3. Aortic atherosclerosis. Electronically Signed   By: Vinnie Langton M.D.   On: 09/16/2019 17:46   Ct Abdomen Pelvis W Contrast  Result Date: 09/13/2019 CLINICAL DATA:  Increased abdominal pain on the left side EXAM: CT ABDOMEN AND PELVIS WITH CONTRAST TECHNIQUE: Multidetector CT imaging of the abdomen and pelvis was performed using the standard protocol following bolus administration of intravenous contrast. CONTRAST:   123mL OMNIPAQUE IOHEXOL 300 MG/ML  SOLN COMPARISON:  MRI 02/25/2014, CT chest 03/02/2014 FINDINGS: Lower chest: Lung bases demonstrate irregular nodular airspace disease at the medial right base, no change since 2015 and therefore presumed to represent a benign finding. On that study, this was felt to represent a sequestration. No pleural effusion. Normal heart size. Hepatobiliary: Stable cyst within the left hepatic lobe. No calcified gallstone or biliary dilatation Pancreas: Unremarkable. No pancreatic ductal dilatation or surrounding inflammatory changes. Spleen: Normal in size without focal abnormality. Adrenals/Urinary Tract: Adrenal glands are unremarkable. Kidneys are normal, without renal calculi, focal lesion, or hydronephrosis. Bladder is unremarkable. Stomach/Bowel: The stomach is nonenlarged. No dilated small bowel. Focally thickened small bowel loop within the central pelvis, series 2, image number 57, likely reactive. Prominent wall thickening of the sigmoid colon with surrounding inflammatory change consistent with acute diverticulitis. 4.3 x 2.2 cm suspected intramural gas collection. Multiple foci of extraluminal gas within the central pelvis with surrounding inflammation. Negative appendix. Vascular/Lymphatic: Mild aortic atherosclerosis. No aneurysm. No significantly enlarged lymph nodes Reproductive: Prostate is unremarkable. Other: No significant free fluid Musculoskeletal: No acute or suspicious osseous abnormality. IMPRESSION: 1. Findings felt consistent with perforated sigmoid colon diverticulitis with localized gas in the vicinity of inflamed sigmoid colon. There also appears to be a 4.3 cm intramural gas collection at the sigmoid colon suggestive of abscess. 2. Thickened small bowel loops within the pelvis, likely reactive to the inflammatory changes at the sigmoid colon. 3. Stable nodular masslike area in the medial  right lower lobe, felt benign; this is thought to represent sequestration  on prior chest imaging. Critical Value/emergent results were called by telephone at the time of interpretation on 09/13/2019 at 11:05 pm to providerRACHEL LITTLE , who verbally acknowledged these results. Electronically Signed   By: Donavan Foil M.D.   On: 09/13/2019 23:05      Subjective: Denies complaints.  Tolerated soft food without nausea, vomiting or abdominal pain.  Loose stools improving and getting more formed and decreasing in frequency.  Counseled patient and spouse at bedside to try probiotic yogurts while on antibiotics at home.  Discharge Exam:  Vitals:   09/18/19 0622 09/18/19 1300 09/18/19 2314 09/19/19 0637  BP: (!) 156/83 (!) 158/84 (!) 163/77 (!) 158/80  Pulse: 67 79 67 72  Resp: 17 18 16 17   Temp: 97.7 F (36.5 C) 98.4 F (36.9 C) 98.1 F (36.7 C) 98.1 F (36.7 C)  TempSrc: Oral Oral Oral Oral  SpO2: 98% 96% 97% 98%  Weight: 96.4 kg     Height:        General exam: Pleasant middle-age male, moderately built and obese seen ambulating comfortably in the room Respiratory system: Clear to auscultation.  No increased work of breathing. Cardiovascular system: S1 & S2 heard, RRR. No JVD, murmurs, rubs, gallops or clicks. No pedal edema. Gastrointestinal system: Abdomen is nondistended, soft and nontender.  Normal bowel sounds heard. Central nervous system: Alert and oriented. No focal neurological deficits. Extremities: Symmetric 5 x 5 power. Skin: No rashes, lesions or ulcers Psychiatry: Judgement and insight appear normal. Mood & affect appropriate.    The results of significant diagnostics from this hospitalization (including imaging, microbiology, ancillary and laboratory) are listed below for reference.     Microbiology: Recent Results (from the past 240 hour(s))  Culture, blood (routine x 2)     Status: None (Preliminary result)   Collection Time: 09/13/19 11:02 PM   Specimen: BLOOD  Result Value Ref Range Status   Specimen Description   Final     BLOOD RIGHT ANTECUBITAL Performed at [facility] [address] [place], [place] [address]    Special Requests   Final    BOTTLES DRAWN AEROBIC AND ANAEROBIC Blood Culture results may not be optimal due to an excessive volume of blood received in culture bottles Performed at [facility] [address] [place], [place] [address]    Culture   Final    NO GROWTH 4 DAYS Performed at [facility], [place] [address] [place], [place] [address]    Report Status PENDING  Incomplete  Culture, blood (routine x 2)     Status: None (Preliminary result)   Collection Time: 09/13/19 11:07 PM   Specimen: BLOOD  Result Value Ref Range Status   Specimen Description   Final    BLOOD RIGHT HAND Performed at [facility] [address], [place], [place] [address]    Special Requests   Final    BOTTLES DRAWN AEROBIC AND ANAEROBIC Blood Culture adequate volume Performed at [facility] [address] [place], [place] [address]    Culture   Final    NO GROWTH 4 DAYS Performed at [facility], [place] [address] [place], [place] [address]    Report Status PENDING  Incomplete  SARS CORONAVIRUS 2 (TAT 6-24 HRS) Nasopharyngeal Nasopharyngeal Swab     Status: None   Collection Time: 09/13/19 11:25 PM   Specimen: Nasopharyngeal Swab  Result Value Ref Range Status   SARS Coronavirus 2 NEGATIVE NEGATIVE Final    Comment: (NOTE) SARS-CoV-2 target nucleic acids are NOT DETECTED. The SARS-CoV-2 RNA is generally detectable in upper and lower respiratory specimens during the acute phase of infection. Negative results do not preclude SARS-CoV-2 infection, do not rule out co-infections with other pathogens, and should not be used as the sole basis for treatment or other patient management decisions. Negative results must be combined with clinical observations, patient history, and epidemiological information. The  expected result is Negative. Fact Sheet for Patients: SugarRoll.be Fact Sheet for Healthcare Providers: https://www.woods-mathews.com/ This test is not yet approved or cleared by the [place] FDA and  has been authorized for detection and/or diagnosis of SARS-CoV-2 by FDA under an Emergency Use Authorization (EUA). This EUA will remain  in effect (meaning this test can be used) for the duration of the COVID-19 declaration under Section 56 4(b)(1) of the Act, 21 U.S.C. section 360bbb-3(b)(1), unless the authorization is terminated or revoked sooner. Performed at [facility], [place] [address], [place], [place] [address]      Labs: CBC: Recent Labs  Lab 09/13/19 1841 09/14/19 0541 09/15/19 0547 09/16/19 0611 09/18/19 0611  WBC 13.1* 9.6 6.7 6.3 8.0  NEUTROABS 10.7* 6.1  --   --   --   HGB 13.0 11.7* 11.9* 11.9* 12.1*  HCT 40.7 37.0* 37.4* 36.8* 36.1*  MCV 91.7 94.6 91.9 91.3 89.4  PLT 391 320 332 362 [id number]   Basic Metabolic Panel: Recent Labs  Lab 09/13/19 1841 09/14/19 0541 09/15/19 0547 09/16/19 0611  NA 137 139 138 139  K 4.1 3.7 3.9 3.9  CL 105 106 107 108  CO2 20* 23 19* 21*  GLUCOSE 101* 94 71 91  BUN 18 18 19 12   CREATININE 1.00 1.06 1.14 1.06  CALCIUM 9.0 8.5* 8.1* 8.3*  MG  --   --  2.4  --    Liver Function Tests: Recent Labs  Lab 09/13/19 1841  AST 27  ALT 26  ALKPHOS 50  BILITOT 0.7  PROT 7.8  ALBUMIN 3.3*   BNP (last 3 results) No results for input(s): BNP in the last 8760 hours. Cardiac Enzymes: No results for input(s): CKTOTAL, CKMB, CKMBINDEX, TROPONINI in the last 168 hours. CBG: Recent Labs  Lab 09/18/19 0607 09/18/19 1138 09/18/19 1610 09/19/19 0601 09/19/19 1132  GLUCAP 127* 100* 132* 102* 181*   Urinalysis    Component Value Date/Time   COLORURINE AMBER (A) 09/13/2019 1804   APPEARANCEUR HAZY (A) 09/13/2019 1804   LABSPEC 1.030 09/13/2019 1804   PHURINE 5.0 09/13/2019  1804   GLUCOSEU NEGATIVE 09/13/2019 1804   [place] (A) 09/13/2019 [place] 09/13/2019 1804   KETONESUR 20 (A) 09/13/2019 1804   PROTEINUR 100 (A) 09/13/2019 1804   NITRITE NEGATIVE 09/13/2019 1804   LEUKOCYTESUR NEGATIVE 09/13/2019 1804    Discussed in detail with patient spouse at bedside, updated care and answered questions.  Time coordinating discharge: 40 minutes  SIGNED:  Vernell Leep, MD, FACP, [facility]  To contact the attending provider between 7A-7P or the covering provider during after hours 7P-7A, please log into the web site [email or web address] and access using universal [place] password for that web site. If you do not have the password, please call the hospital operator.

## 2019-09-19 NOTE — Progress Notes (Signed)
[facility] Progress Note     Subjective: CC-  Tolerated soft diet.  No fevers.  Still with some diarrhea but getting more solid.    Objective: Vital signs in last 24 hours: Temp:  [98.1 F (36.7 C)-98.4 F (36.9 C)] 98.1 F (36.7 C) (10/25 0637) Pulse Rate:  [67-79] 72 (10/25 0637) Resp:  [16-18] 17 (10/25 0637) BP: (158-163)/(77-84) 158/80 (10/25 0637) SpO2:  [96 %-98 %] 98 % (10/25 0637) Last BM Date: 09/17/19  Intake/Output from previous day: 10/24 0701 - 10/25 0700 In: 1553.4 [I.V.:1454; IV Piggyback:99.4] Out: -  Intake/Output this shift: No intake/output data recorded.  PE: Gen:  Alert, NAD, pleasant Pulm:  Rate and effort normal Abd: Soft, NT/ND, no palpable fullness in LLQ. Psych: A&Ox3  Skin: warm and dry  Lab Results:  Recent Labs    09/18/19 0611  WBC 8.0  HGB 12.1*  HCT 36.1*  PLT 290   BMET No results for input(s): NA, K, CL, CO2, GLUCOSE, BUN, CREATININE, CALCIUM in the last 72 hours. PT/INR No results for input(s): LABPROT, INR in the last 72 hours. CMP     Component Value Date/Time   NA 139 09/16/2019 0611   K 3.9 09/16/2019 0611   CL 108 09/16/2019 0611   CO2 21 (L) 09/16/2019 0611   GLUCOSE 91 09/16/2019 0611   BUN 12 09/16/2019 0611   CREATININE 1.06 09/16/2019 0611   CALCIUM 8.3 (L) 09/16/2019 0611   PROT 7.8 09/13/2019 1841   ALBUMIN 3.3 (L) 09/13/2019 1841   AST 27 09/13/2019 1841   ALT 26 09/13/2019 1841   ALKPHOS 50 09/13/2019 1841   BILITOT 0.7 09/13/2019 1841   GFRNONAA >60 09/16/2019 0611   GFRAA >60 09/16/2019 0611   Lipase     Component Value Date/Time   LIPASE 23 09/13/2019 1841       Studies/Results: No results found.  Anti-infectives: Anti-infectives (From admission, onward)   Start     Dose/Rate Route Frequency Ordered Stop   09/19/19 0000  amoxicillin-clavulanate (AUGMENTIN) 875-125 MG tablet     1 tablet Oral 2 times daily 09/19/19 0844 10/10/19 2359   09/14/19 0600   piperacillin-tazobactam (ZOSYN) IVPB 3.375 g     3.375 g 12.5 mL/hr over 240 Minutes Intravenous Every 8 hours 09/14/19 0308     09/13/19 2315  piperacillin-tazobactam (ZOSYN) IVPB 3.375 g     3.375 g 100 mL/hr over 30 Minutes Intravenous  Once 09/13/19 2303 09/14/19 0027       Assessment/Plan Rheumatoid arthritis-on [name]9/27-last dose;methotrexate10/13 - last dose Hypertension Obstructive sleep apnea-does not use CPAP BMI ~34  Sigmoid diverticulitis with microperforation - CT 10/19 showed 4.3 x 2.2cmsuspected intramural gas collection, multiple foci of extraluminal gas within the surrounding pelvis with inflammation; negative appendix - follow CT 10/22 persistent perforated sigmoid diverticulitis with increasing gas and fluid in the sigmoid mesocolon and worsening surrounding inflammatory changes   [id number] 10/19 >> Day6 DVT: SCD's, per primary/ ok for chemical DVT prophylaxis from surgical standpoint Follow up: TBD  Plan: Although his CT scan does not look great, the patient remains clinically stable with a benign abdominal exam, afebrile, and VSS. . OK to d/c home today.  I have written antibiotic script.  I have also included soft diet instructions and sent message regarding follow up appointment.    Stay off humira and MTX.     LOS: 6 days   Milus Height, MD FACS Surgical Oncology, General Surgery, [facility] [phone] [email or web address] for call schedule

## 2019-09-19 NOTE — Discharge Instructions (Addendum)
Soft-Food Eating Plan A soft-food eating plan includes foods that are safe and easy to chew and swallow. Your health care provider or dietitian can help you find foods and flavors that fit into this plan. Follow this plan until your health care provider or dietitian says it is safe to start eating other foods and food textures. What are tips for following this plan? General guidelines   Take small bites of food, or cut food into pieces about  inch or smaller. Bite-sized pieces of food are easier to chew and swallow.  Eat moist foods. Avoid overly dry foods.  Avoid foods that: ? Are difficult to swallow, such as dry, chunky, crispy, or sticky foods. ? Are difficult to chew, such as hard, tough, or stringy foods. ? Contain nuts, seeds, or fruits.  Follow instructions from your dietitian about the types of liquids that are safe for you to swallow. You may be allowed to have: ? Thick liquids only. This includes only liquids that are thicker than honey. ? Thin and thick liquids. This includes all beverages and foods that become liquid at room temperature.  To make thick liquids: ? Purchase a commercial liquid thickening powder. These are available at grocery stores and pharmacies. ? Mix the thickener into liquids according to instructions on the label. ? Purchase ready-made thickened liquids. ? Thicken soup by pureeing, straining to remove chunks, and adding flour, potato flakes, or corn starch. ? Add commercial thickener to foods that become liquid at room temperature, such as milk shakes, yogurt, ice cream, gelatin, and sherbet.  Ask your health care provider whether you need to take a fiber supplement. Cooking  Cook meats so they stay tender and moist. Use methods like braising, stewing, or baking in liquid.  Cook vegetables and fruit until they are soft enough to be mashed with a fork.  Peel soft, fresh fruits such as peaches, nectarines, and melons.  When making soup, make sure  chunks of meat and vegetables are smaller than  inch.  Reheat leftover foods slowly so that a tough crust does not form. What foods are allowed? The items listed below may not be a complete list. Talk with your dietitian about what dietary choices are best for you. Grains Breads, muffins, pancakes, or waffles moistened with syrup, jelly, or butter. Dry cereals well-moistened with milk. Moist, cooked cereals. Well-cooked pasta and rice. Vegetables All soft-cooked vegetables. Shredded lettuce. Fruits All canned and cooked fruits. Soft, peeled fresh fruits. Strawberries. Dairy Milk. Cream. Yogurt. Cottage cheese. Soft cheese without the rind. Meats and other protein foods Tender, moist ground meat, poultry, or fish. Meat cooked in gravy or sauces. Eggs. Sweets and desserts Ice cream. Milk shakes. Sherbet. Pudding. Fats and oils Butter. Margarine. Olive, canola, sunflower, and grapeseed oil. Smooth salad dressing. Smooth cream cheese. Mayonnaise. Gravy. What foods are not allowed? The items listed bemay not be a complete list. Talk with your dietitian about what dietary choices are best for you. Grains Coarse or dry cereals, such as bran, granola, and shredded wheat. Tough or chewy crusty breads, such as [place] bread or baguettes. Breads with nuts, seeds, or fruit. Vegetables All raw vegetables. Cooked corn. Cooked vegetables that are tough or stringy. Tough, crisp, fried potatoes and potato skins. Fruits Fresh fruits with skins or seeds, or both, such as apples, pears, and grapes. Stringy, high-pulp fruits, such as papaya, pineapple, coconut, and mango. Fruit leather and all dried fruit. Dairy Yogurt with nuts or coconut. Meats and other protein foods Hard,  dry sausages. Dry meat, poultry, or fish. Meats with gristle. Fish with bones. Fried meat or fish. Lunch meat and hotdogs. Nuts and seeds. Chunky peanut butter or other nut butters. Sweets and desserts Cakes or cookies that are very  dry or chewy. Desserts with dried fruit, nuts, or coconut. Fried pastries. Very rich pastries. Fats and oils Cream cheese with fruit or nuts. Salad dressings with seeds or chunks. Summary  A soft-food eating plan includes foods that are safe and easy to swallow. Generally, the foods should be soft enough to be mashed with a fork.  Avoid foods that are dry, hard to chew, crunchy, sticky, stringy, or crispy.  Ask your health care provider whether you need to thicken your liquids and if you need to take a fiber supplement. This information is not intended to replace advice given to you by your health care provider. Make sure you discuss any questions you have with your health care provider. Document Released: 02/18/2008 Document Revised: 03/04/2019 Document Reviewed: 01/14/2017 Elsevier Patient Education  2020 [employer].  Additional discharge instructions  Please get your medications reviewed and adjusted by your Primary MD.  Please request your Primary MD to go over all Hospital Tests and Procedure/Radiological results at the follow up, please get all Hospital records sent to your Prim MD by signing hospital release before you go home.  If you had Pneumonia of Lung problems at the Hospital: Please get a 2 view Chest X ray done in 6-8 weeks after hospital discharge or sooner if instructed by your Primary MD.  If you have Congestive Heart Failure: Please call your Cardiologist or Primary MD anytime you have any of the following symptoms:  1) 3 pound weight gain in 24 hours or 5 pounds in 1 week  2) shortness of breath, with or without a dry hacking cough  3) swelling in the hands, feet or stomach  4) if you have to sleep on extra pillows at night in order to breathe  Follow cardiac low salt diet and 1.5 lit/day fluid restriction.  If you have diabetes Accuchecks 4 times/day, Once in AM empty stomach and then before each meal. Log in all results and show them to your primary doctor  at your next visit. If any glucose reading is under 80 or above 300 call your primary MD immediately.  If you have Seizure/Convulsions/Epilepsy: Please do not drive, operate heavy machinery, participate in activities at heights or participate in high speed sports until you have seen by Primary MD or a Neurologist and advised to do so again.  If you had Gastrointestinal Bleeding: Please ask your Primary MD to check a complete blood count within one week of discharge or at your next visit. Your endoscopic/colonoscopic biopsies that are pending at the time of discharge, will also need to followed by your Primary MD.  Get Medicines reviewed and adjusted. Please take all your medications with you for your next visit with your Primary MD  Please request your Primary MD to go over all hospital tests and procedure/radiological results at the follow up, please ask your Primary MD to get all Hospital records sent to his/her office.  If you experience worsening of your admission symptoms, develop shortness of breath, life threatening emergency, suicidal or homicidal thoughts you must seek medical attention immediately by calling 911 or calling your MD immediately  if symptoms less severe.  You must read complete instructions/literature along with all the possible adverse reactions/side effects for all the Medicines you take  and that have been prescribed to you. Take any new Medicines after you have completely understood and accpet all the possible adverse reactions/side effects.   Do not drive or operate heavy machinery when taking Pain medications.   Do not take more than prescribed Pain, Sleep and Anxiety Medications  Special Instructions: If you have smoked or chewed Tobacco  in the last 2 yrs please stop smoking, stop any regular Alcohol  and or any Recreational drug use.  Wear Seat belts while driving.  Please note You were cared for by a hospitalist during your hospital stay. If you have any  questions about your discharge medications or the care you received while you were in the hospital after you are discharged, you can call the unit and asked to speak with the hospitalist on call if the hospitalist that took care of you is not available. Once you are discharged, your primary care physician will handle any further medical issues. Please note that NO REFILLS for any discharge medications will be authorized once you are discharged, as it is imperative that you return to your primary care physician (or establish a relationship with a primary care physician if you do not have one) for your aftercare needs so that they can reassess your need for medications and monitor your lab values.  You can reach the hospitalist office at phone [phone] or fax [phone]   If you do not have a primary care physician, you can call [phone] for a physician referral.

## 2019-09-22 ENCOUNTER — Encounter: Payer: Self-pay | Admitting: Gastroenterology

## 2019-10-01 ENCOUNTER — Encounter: Payer: Self-pay | Admitting: Gastroenterology

## 2019-10-01 ENCOUNTER — Ambulatory Visit: Payer: BC Managed Care – PPO | Admitting: Gastroenterology

## 2019-10-01 ENCOUNTER — Encounter: Payer: Self-pay | Admitting: Surgery

## 2019-10-01 VITALS — BP 152/82 | HR 82 | Temp 97.6°F | Ht 65.75 in | Wt 208.0 lb

## 2019-10-01 DIAGNOSIS — D84821 Immunodeficiency due to drugs: Secondary | ICD-10-CM | POA: Insufficient documentation

## 2019-10-01 DIAGNOSIS — K5720 Diverticulitis of large intestine with perforation and abscess without bleeding: Secondary | ICD-10-CM | POA: Diagnosis not present

## 2019-10-01 DIAGNOSIS — Z79899 Other long term (current) drug therapy: Secondary | ICD-10-CM | POA: Insufficient documentation
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
## 2019-10-01 NOTE — Progress Notes (Signed)
Noted  

## 2019-10-01 NOTE — Patient Instructions (Addendum)
If you are age 67 or older, your body mass index should be between 23-30. Your Body mass index is 33.83 kg/m. If this is out of the aforementioned range listed, please consider follow up with your Primary Care Provider.  If you are age 54 or younger, your body mass index should be between 19-25. Your Body mass index is 33.83 kg/m. If this is out of the aformentioned range listed, please consider follow up with your Primary Care Provider.   Your provider has requested that you go to the basement level for lab work on November 15, 2019. Press "B" on the elevator. The lab is located at the first door on the left as you exit the elevator.  You have been scheduled for a CT scan of the abdomen and pelvis at [facility]. You are scheduled on 11/22/19 at 8 am. You should arrive 15 minutes prior to your appointment time for registration. Please follow the written instructions below on the day of your exam:  WARNING: IF YOU ARE ALLERGIC TO IODINE/X-RAY DYE, PLEASE NOTIFY RADIOLOGY IMMEDIATELY AT [phone]! YOU WILL BE GIVEN A 13 HOUR PREMEDICATION PREP.  1) Do not eat or drink anything after 4 am (4 hours prior to your test) 2) You have been given 2 bottles of oral contrast to drink. The solution may taste better if refrigerated, but do NOT add ice or any other liquid to this solution. Shake well before drinking.    Drink 1 bottle of contrast @ 6 am (2 hours prior to your exam)  Drink 1 bottle of contrast @ 7 am (1 hour prior to your exam)  You may take any medications as prescribed with a small amount of water, if necessary. If you take any of the following medications: METFORMIN, GLUCOPHAGE, GLUCOVANCE, AVANDAMET, RIOMET, FORTAMET, [place] MET, JANUMET, GLUMETZA or METAGLIP, you MAY be asked to HOLD this medication 48 hours AFTER the exam.  The purpose of you drinking the oral contrast is to aid in the visualization of your intestinal tract. The contrast solution may cause some diarrhea.  Depending on your individual set of symptoms, you may also receive an intravenous injection of x-ray contrast/dye. Plan on being at [facility] for 30 minutes or longer, depending on the type of exam you are having performed.  This test typically takes 30-45 minutes to complete.  If you have any questions regarding your exam or if you need to reschedule, you may call the CT department at [phone] the hours of 8:00 am and 5:00 pm, Monday-Friday.  ______________________________________________________________  We will contact you regarding a colonoscopy with Dr. Henrene Pastor when the [facility] schedule for January is available.  Thank you for choosing me and [facility].   Alonza Bogus, PA-C

## 2019-10-04 ENCOUNTER — Ambulatory Visit: Payer: BC Managed Care – PPO | Admitting: Gastroenterology

## 2019-10-06 ENCOUNTER — Ambulatory Visit: Payer: Self-pay | Admitting: Surgery

## 2019-10-06 NOTE — H&P (Signed)
Nathaniel [name] Documented: 10/06/2019 3:01 PM Location: [facility] Patient #: [id number] DOB: 07/26/1951 Married / Language: Cleophus Molt / Race: White Male  History of Present Illness Nathaniel Hector MD; 10/06/2019 5:54 PM) The patient is a 68 year old male who presents with diverticulitis. Note for "Diverticulitis": ` ` ` Patient sent for surgical consultation at the request of Dr. Barry Dienes in the [facility] gastrology  Chief Complaint: Diverticulitis with perforation on chronic immunosuppression. ` ` The patient is a pleasant gentleman on [name] for rheumatoid arthritis. Had a several week history of bowel changes and questionable discomfort. Usually moves his bowels twice a day. His change concerned him. Some discomfort. Went to an urgent care center while he was on vacation. Had follow-up primary care office. CT scan showed diverticulitis with significant perforation. He was admitted. He was in the hospital for almost a week and gradually improved on antibiotics. Recommendation made to consider surgical resection given the, located attack in an immunosuppressed patient. Patient is nearly finished with his 2 week oral Augmentin and about a course. He is not taking [name] nor methotrexate since 10/22. Patient comes a with his wife. He denies any abdominal surgery. He is nonsmoker is not diabetic. He can walk at least a half hour without difficulty. He had a colonoscopy about 5 years ago. Initially held off on considering surgery in 1 discussed with gastroenterology first. There is discussion about a repeat CT scan.  No personal nor family history of GI/colon cancer, inflammatory bowel disease, irritable bowel syndrome, allergy such as Celiac Sprue, dietary/dairy problems, colitis, ulcers nor gastritis. No recent sick contacts/gastroenteritis. No travel outside the country. No changes in diet. No dysphagia to solids or liquids. No significant heartburn  or reflux. No hematochezia, hematemesis, coffee ground emesis. No evidence of prior gastric/peptic ulceration.  (Review of systems as stated in this history (HPI) or in the review of systems. Otherwise all other 12 point ROS are negative) ` ` `   Allergies [name], CMA; 10/06/2019 3:01 PM) No Known Drug Allergies [10/06/2019]: Allergies Reconciled  Medication History [name], CMA; 10/06/2019 3:02 PM) Amoxicillin-Pot Clavulanate (875-125MG  Tablet, Oral) Active. Losartan Potassium (100MG  Tablet, Oral) Active. Folic Acid (1MG  Tablet, Oral) Active. Medications Reconciled    Vitals [name] CMA; 10/06/2019 3:02 PM) 10/06/2019 3:02 PM Weight: 209.6 lb Height: 67in Body Surface Area: 2.06 m Body Mass Index: 32.83 kg/m  Temp.: 97.53F  Pulse: 97 (Regular)  BP: 150/80 (Sitting, Left Arm, Standard)        Physical Exam Nathaniel Hector MD; 10/06/2019 5:54 PM)  General Mental Status-Alert. General Appearance-Not in acute distress, Not Sickly. Orientation-Oriented X3. Hydration-Well hydrated. Voice-Normal.  Integumentary Global Assessment Upon inspection and palpation of skin surfaces of the - Axillae: non-tender, no inflammation or ulceration, no drainage. and Distribution of scalp and body hair is normal. General Characteristics Temperature - normal warmth is noted.  Head and Neck Head-normocephalic, atraumatic with no lesions or palpable masses. Face Global Assessment - atraumatic, no absence of expression. Neck Global Assessment - no abnormal movements, no bruit auscultated on the right, no bruit auscultated on the left, no decreased range of motion, non-tender. Trachea-midline. Thyroid Gland Characteristics - non-tender.  Eye Eyeball - Left-Extraocular movements intact, No Nystagmus - Left. Eyeball - Right-Extraocular movements intact, No Nystagmus - Right. Cornea - Left-No Hazy - Left. Cornea - Right-No  Hazy - Right. Sclera/Conjunctiva - Left-No scleral icterus, No Discharge - Left. Sclera/Conjunctiva - Right-No scleral icterus, No Discharge - Right.  Pupil - Left-Direct reaction to light normal. Pupil - Right-Direct reaction to light normal.  ENMT Ears Pinna - Left - no drainage observed, no generalized tenderness observed. Pinna - Right - no drainage observed, no generalized tenderness observed. Nose and Sinuses External Inspection of the Nose - no destructive lesion observed. Inspection of the nares - Left - quiet respiration. Inspection of the nares - Right - quiet respiration. Mouth and Throat Lips - Upper Lip - no fissures observed, no pallor noted. Lower Lip - no fissures observed, no pallor noted. Nasopharynx - no discharge present. Oral Cavity/Oropharynx - Tongue - no dryness observed. Oral Mucosa - no cyanosis observed. Hypopharynx - no evidence of airway distress observed.  Chest and Lung Exam Inspection Movements - Normal and Symmetrical. Accessory muscles - No use of accessory muscles in breathing. Palpation Palpation of the chest reveals - Non-tender. Auscultation Breath sounds - Normal and Clear.  Cardiovascular Auscultation Rhythm - Regular. Murmurs & Other Heart Sounds - Auscultation of the heart reveals - No Murmurs and No Systolic Clicks.  Abdomen Inspection Inspection of the abdomen reveals - No Visible peristalsis and No Abnormal pulsations. Umbilicus - No Bleeding, No Urine drainage. Palpation/Percussion Palpation and Percussion of the abdomen reveal - Soft, Non Tender, No Rebound tenderness, No Rigidity (guarding) and No Cutaneous hyperesthesia. Note: Abdomen soft. Mildly overweight with some mild supraumbilical diastases recti. No umbilical hernia. Nontender. Not distended. No umbilical or incisional hernias. No guarding.  Male Genitourinary Sexual Maturity Tanner 5 - Adult hair pattern and Adult penile size and shape. Note: No inguinal  hernias. Normal external genitalia. Epididymi, testes, and spermatic cords normal without any masses.  Rectal Note: Deferred given recent hospitalization and planned future colonoscopy  Peripheral Vascular Upper Extremity Inspection - Left - No Cyanotic nailbeds - Left, Not Ischemic. Inspection - Right - No Cyanotic nailbeds - Right, Not Ischemic.  Neurologic Neurologic evaluation reveals -normal attention span and ability to concentrate, able to name objects and repeat phrases. Appropriate fund of knowledge , normal sensation and normal coordination. Mental Status Affect - not angry, not paranoid. Cranial Nerves-Normal Bilaterally. Gait-Normal.  Neuropsychiatric Mental status exam performed with findings of-able to articulate well with normal speech/language, rate, volume and coherence, thought content normal with ability to perform basic computations and apply abstract reasoning and no evidence of hallucinations, delusions, obsessions or homicidal/suicidal ideation.  Musculoskeletal Global Assessment Spine, Ribs and Pelvis - no instability, subluxation or laxity. Right Upper Extremity - no instability, subluxation or laxity.  Lymphatic Head & Neck  General Head & Neck Lymphatics: Bilateral - Description - No Localized lymphadenopathy. Axillary  General Axillary Region: Bilateral - Description - No Localized lymphadenopathy. Femoral & Inguinal  Generalized Femoral & Inguinal Lymphatics: Left - Description - No Localized lymphadenopathy. Right - Description - No Localized lymphadenopathy.    Assessment & Plan Nathaniel Hector MD; 10/06/2019 5:55 PM)  DIVERTICULITIS OF LARGE INTESTINE WITH PERFORATION WITHOUT BLEEDING (K57.20) Impression: Episode of diverticulitis with significant perforation of gasses stabilized on antibiotics. Chronic immunosuppressed on [name] for moderately severe rheumatoid arthritis followed by Dr. Amil Amen. Last doses  09/16/2019  Because of this complicated attack in an immunosuppressed patient, I think he would benefit from sigmoid colectomy. We'll plan robotically. Would plan at least 6 weeks from discharge.  Gastroenterology would like to have a repeat CAT scan before consider colonoscopy. They were planning in 5 weeks. Patient due to finish his 2 week post discharge antibiotics later this week. Therefore, plan CT scan next  week. If there is no abscess or inflammation, that is reassuring. If that shows concerning findings, may require percutaneous drainage and reevaluated. He is immunosuppressed, hiding most of his symptoms. However, he has been off that for 3 weeks and his constipation and bowel changes and other mild but definite symptoms have resolved. Hopefully that means things have improved.  The opening at this done before the end of year but understand it may need to wait until January. We will see.  Current Plans Pt Education - [facility] Free Text Education/Instructions: discussed with patient and provided information. You are being scheduled for surgery- Our schedulers will call you.  You should hear from our office's scheduling department within 5 working days about the location, date, and time of surgery. We try to make accommodations for patient's preferences in scheduling surgery, but sometimes the OR schedule or the surgeon's schedule prevents [name] from making those accommodations.  If you have not heard from our office [phone]) in 5 working days, call the office and ask for your surgeon's nurse.  If you have other questions about your diagnosis, plan, or surgery, call the office and ask for your surgeon's nurse.  Pt Education - [facility] Diverticular Disease (AT) Instructed to schedule colonoscopywith a gastroenterologist  PREOP COLON - ENCOUNTER FOR PREOPERATIVE EXAMINATION FOR GENERAL SURGICAL PROCEDURE (Z01.818)  Current Plans Written instructions provided The anatomy & physiology of the  digestive tract was discussed. The pathophysiology of the colon was discussed. Natural history risks without surgery was discussed. I feel the risks of no intervention will lead to serious problems that outweigh the operative risks; therefore, I recommended a partial colectomy to remove the pathology. Minimally invasive (Robotic/Laparoscopic) & open techniques were discussed.  Risks such as bleeding, infection, abscess, leak, reoperation, possible ostomy, hernia, heart attack, death, and other risks were discussed. I noted a good likelihood this will help address the problem. Goals of post-operative recovery were discussed as well. Need for adequate nutrition, daily bowel regimen and healthy physical activity, to optimize recovery was noted as well. We will work to minimize complications. Educational materials were available as well. Questions were answered. The patient expresses understanding & wishes to proceed with surgery.  Pt Education - [facility] Colon Bowel Prep 2018 ERAS/Miralax/Antibiotics Started Neomycin Sulfate 500 MG Oral Tablet, 2 (two) Tablet SEE NOTE, #6, 10/06/2019, No Refill. Local Order: Pharmacist Notes: TAKE TWO TABLETS AT 2 PM, 3 PM, AND 10 PM THE DAY PRIOR TO SURGERY Started Flagyl 500 MG Oral Tablet, 2 (two) Tablet SEE NOTE, #6, 10/06/2019, No Refill. Local Order: Pharmacist Notes: Take at 2pm, 3pm, and 10pm the day prior to your colon operation Pt Education - Pamphlet Given - Laparoscopic Colorectal Surgery: discussed with patient and provided information. Pt Education - [facility] Colectomy post-op instructions: discussed with patient and provided information.  Nathaniel Hector, MD, FACS, MASCRS Gastrointestinal and Minimally Invasive Surgery  [facility] [address], [place], [place] [address] [phone] Main / Paging [phone] Fax

## 2019-10-07 ENCOUNTER — Other Ambulatory Visit (HOSPITAL_COMMUNITY): Payer: Self-pay | Admitting: Surgery

## 2019-10-07 ENCOUNTER — Other Ambulatory Visit: Payer: Self-pay | Admitting: Surgery

## 2019-10-07 DIAGNOSIS — K5720 Diverticulitis of large intestine with perforation and abscess without bleeding: Secondary | ICD-10-CM

## 2019-10-11 ENCOUNTER — Ambulatory Visit (HOSPITAL_COMMUNITY)
Admission: RE | Admit: 2019-10-11 | Discharge: 2019-10-11 | Disposition: A | Discharge status: Home / Self Care | Payer: BC Managed Care – PPO | Source: Ambulatory Visit | Attending: Surgery | Admitting: Surgery

## 2019-10-11 ENCOUNTER — Other Ambulatory Visit: Payer: Self-pay

## 2019-10-11 DIAGNOSIS — K5720 Diverticulitis of large intestine with perforation and abscess without bleeding: Secondary | ICD-10-CM | POA: Diagnosis not present

## 2019-10-11 MED ORDER — SODIUM CHLORIDE (PF) 0.9 % IJ SOLN
INTRAMUSCULAR | Status: AC
  Filled 2019-10-11: qty 50

## 2019-10-11 MED ORDER — IOHEXOL 300 MG/ML  SOLN
100.0000 mL | Freq: Once | INTRAMUSCULAR | Status: AC | PRN
  Administered 2019-10-11: 100 mL via INTRAVENOUS

## 2019-11-02 ENCOUNTER — Telehealth: Payer: Self-pay | Admitting: Internal Medicine

## 2019-11-02 ENCOUNTER — Telehealth: Payer: Self-pay

## 2019-11-02 NOTE — Telephone Encounter (Signed)
I do not.  We will do our best to accommodate the request.  If we cannot, we cannot.

## 2019-11-02 NOTE — Telephone Encounter (Signed)
----- Message from Nathaniel Shipper, MD sent at 10/28/2019 12:11 PM EST ----- Regarding: RE: Schedule repeat CT scan Hi Nathaniel [name],Thanks for the update.  We will schedule a follow-up CT scan in mid December, and go from there.  Quite frankly, I think the chance of him having any "surprises" in his colon are unlikely given his complete colonoscopy in 2015 with excellent preparation showing only diverticulosis.  In any event, we can set up his colonoscopy after the mid December CT scan.  We will keep you posted.  Thanks.JohnLinda,Please set up a contrast-enhanced CT scan of the abdomen and pelvis in mid December (1 month after his most recent) "perforated diverticulitis, following up".  Nathaniel [name] ----- Message ----- From: Nathaniel Boston, MD Sent: 10/28/2019  11:25 AM EST To: Nathaniel [name], Nathaniel Shipper, MD, # Subject: Schedule repeat CT scan                        The patient remains feeling well off antibiotics several weeks.  He is 6 weeks from discarge.  Last CAT scan 2 weeks ago showed his gas pocket was smaller 2.8cm w/o abscess.    Now he is struggling with his RA pain off of his immunosuppression. Not steroid responsive.  He is having to go back on his [name] for now.  I do not want to wait too long.I would try and get a CAT scan again.  I would argue we need to try and get his colonoscopy/surgery scheduled early January.  If we wait to schedule once the CAT scan is perfect, that delays things further.  If the CAT scan is not everyone's satisfaction, we can postpone it.        ----- Message ----- From: Nathaniel Shipper, MD Sent: 10/19/2019   9:31 AM EST To: Nathaniel Boston, MD, Nathaniel [name], # Subject: RE: Please call Nathaniel [name] @ [facility] [phone].     All,Typically we wait 6 to 8 weeks after a bout of diverticulitis before proceeding with "clearance colonoscopy".  This case was a bit more complicated in that the patient had a confined perforation.  Once we are past the 6 to 8-week range, he can  have a CT scan anytime.  Understand that I might not proceed with colonoscopy should significant imaging abnormalities persist.  However, if CT okay, then colonoscopy any point thereafter based on scheduling availability.  Thanks and happy Thanksgiving all.Nathaniel [name] ----- Message ----- From: Nathaniel Champagne, PA-C Sent: 10/19/2019   9:04 AM EST To: Nathaniel Boston, MD, Nathaniel [name], # Subject: RE: Please call Nathaniel [name] @ [facility] [phone].     The patient does not need a CT scan done now.  He just had one done again a couple of weeks ago that did not show complete resolution of his episode of diverticulitis.  This is typically how we proceed with things, confirming that the acute diverticulitis is healed before proceeding with colonoscopy.  I am not the one performing the colonoscopy so ultimately it is Nathaniel [name] call.  We will get his input.  Thank you,  Nathaniel [name] ----- Message ----- From: Nathaniel [name], RMA Sent: 10/19/2019   8:51 AM EST To: Nathaniel Champagne, PA-C Subject: FW: Please call Nathaniel [name] @ [facility] [phone].     Per Nathaniel [name] Dr Nathaniel [name] is wanting CT done earlier so he can get patient on his schedule.  I explained to her that the patient has already had several CT's and that he will have to  have another before a colonscopy can be done.  She ask that you call Dr. Johney [name] and speak with him about this.  Thanks Nathaniel [name] ----- Message ----- From: Nathaniel [name] I Sent: 10/18/2019   3:44 PM EST To: Nathaniel [name], RMA Subject: Please call Nathaniel [name] @ [place] [phone].

## 2019-11-02 NOTE — Telephone Encounter (Signed)
[facility] called to coordinate colonoscopy before surgery.  Pt is scheduled for surgery at [facility] 12/29/18.  [facility] MD requested to schedule colonoscopy the day before surgery.

## 2019-11-02 NOTE — Telephone Encounter (Signed)
Left message for [facility] to call back.

## 2019-11-02 NOTE — Telephone Encounter (Signed)
Pt is currently scheduled for CT of A/P at [facility] 11/22/19@8am .

## 2019-11-02 NOTE — Telephone Encounter (Signed)
Dr. Henrene Pastor please see note below. Do you know your availability/schedule for 12/29/19? Please advise.

## 2019-11-09 NOTE — Telephone Encounter (Signed)
Spoke with [facility] and let them know our Feb schedule is not out yet, will have to wait until schedule comes out.

## 2019-11-15 ENCOUNTER — Other Ambulatory Visit (INDEPENDENT_AMBULATORY_CARE_PROVIDER_SITE_OTHER): Payer: BC Managed Care – PPO

## 2019-11-15 DIAGNOSIS — K5720 Diverticulitis of large intestine with perforation and abscess without bleeding: Secondary | ICD-10-CM

## 2019-11-15 LAB — BUN: BUN: 17 mg/dL (ref 6–23)

## 2019-11-15 LAB — CREATININE, SERUM: Creatinine, Ser: 0.9 mg/dL (ref 0.40–1.50)

## 2019-11-16 ENCOUNTER — Telehealth: Payer: Self-pay | Admitting: General Surgery

## 2019-11-16 NOTE — Telephone Encounter (Signed)
Contacted the patient to schedule his colonoscopy in January. He stated he has a bowel perforation and is scheduled for surgery with Dr Johney Maine on 12/30/2019. He also stated that Dr Johney Maine is wanting to coordinated with Dr Henrene Pastor to completed the colonoscopy while the patient is inpatient, due to not wanting a prep used. I expressed to the patient we would call him back when we have more information regarding Dr Johney Maine request. The patient verbalized understanding.

## 2019-11-16 NOTE — Telephone Encounter (Signed)
Thank you for the help.  I'll watch for that information

## 2019-11-22 ENCOUNTER — Other Ambulatory Visit: Payer: Self-pay

## 2019-11-22 ENCOUNTER — Ambulatory Visit (HOSPITAL_COMMUNITY)
Admission: RE | Admit: 2019-11-22 | Discharge: 2019-11-22 | Disposition: A | Discharge status: Home / Self Care | Payer: BC Managed Care – PPO | Source: Ambulatory Visit | Attending: Gastroenterology | Admitting: Gastroenterology

## 2019-11-22 DIAGNOSIS — K5720 Diverticulitis of large intestine with perforation and abscess without bleeding: Secondary | ICD-10-CM | POA: Diagnosis not present

## 2019-11-22 MED ORDER — IOHEXOL 300 MG/ML  SOLN
100.0000 mL | Freq: Once | INTRAMUSCULAR | Status: AC | PRN
  Administered 2019-11-22: 08:00:00 100 mL via INTRAVENOUS

## 2019-11-22 MED ORDER — SODIUM CHLORIDE (PF) 0.9 % IJ SOLN
INTRAMUSCULAR | Status: AC
  Filled 2019-11-22: qty 50

## 2019-12-22 ENCOUNTER — Encounter: Payer: Self-pay | Admitting: Internal Medicine

## 2019-12-22 ENCOUNTER — Ambulatory Visit: Payer: BC Managed Care – PPO | Admitting: *Deleted

## 2019-12-22 ENCOUNTER — Other Ambulatory Visit: Payer: Self-pay

## 2019-12-22 VITALS — Temp 97.1°F | Ht 66.0 in | Wt 213.2 lb

## 2019-12-22 DIAGNOSIS — Z01818 Encounter for other preprocedural examination: Secondary | ICD-10-CM

## 2019-12-22 DIAGNOSIS — K5792 Diverticulitis of intestine, part unspecified, without perforation or abscess without bleeding: Secondary | ICD-10-CM

## 2019-12-22 MED ORDER — PLENVU 140 G PO SOLR: 1.0000 | Freq: Once | ORAL | 0 refills | Status: AC

## 2019-12-22 NOTE — Progress Notes (Signed)
Wife with pt- temperature 97.5  Pt is aware that care partner will wait in the car during procedure; if they feel like they will be too hot or cold to wait in the car; they may wait in the 4 th floor lobby. Patient is aware to bring only one care partner. We want them to wear a mask (we do not have any that we can provide them), practice social distancing, and we will check their temperatures when they get here.  I did remind the patient that their care partner needs to stay in the parking lot the entire time and have a cell phone available, we will call them when the pt is ready for discharge. Patient will wear mask into building.  No egg or soy allergy  No home oxygen use or problems with anesthesia  No medications for weight loss taken  emmi information given  covid test 12-27-19 at 900  [employer] coupon given  Pt knows to quarantine after covid test d/t having surgery at the hospital

## 2019-12-24 ENCOUNTER — Telehealth: Payer: Self-pay | Admitting: Internal Medicine

## 2019-12-24 HISTORY — PX: CYSTOSCOPY: SUR368

## 2019-12-24 NOTE — Telephone Encounter (Signed)
Spoke with patient who had decided to go ahead and pick up his prep.  I asked him to call me if he had any other issues.  Patient agreed.

## 2019-12-27 ENCOUNTER — Other Ambulatory Visit (HOSPITAL_COMMUNITY)
Admission: RE | Admit: 2019-12-27 | Discharge: 2019-12-27 | Disposition: A | Discharge status: Home / Self Care | Payer: BC Managed Care – PPO | Source: Ambulatory Visit | Attending: Surgery | Admitting: Surgery

## 2019-12-27 DIAGNOSIS — Z20822 Contact with and (suspected) exposure to covid-19: Secondary | ICD-10-CM | POA: Insufficient documentation

## 2019-12-27 DIAGNOSIS — Z01812 Encounter for preprocedural laboratory examination: Secondary | ICD-10-CM | POA: Insufficient documentation

## 2019-12-27 LAB — SARS CORONAVIRUS 2 (TAT 6-24 HRS): SARS Coronavirus 2: NEGATIVE

## 2019-12-27 NOTE — Patient Instructions (Addendum)
DUE TO COVID-19 ONLY ONE VISITOR IS ALLOWED TO COME WITH YOU AND STAY IN THE WAITING ROOM ONLY DURING PRE OP AND PROCEDURE DAY OF SURGERY. THE 1 VISITOR MAY VISIT WITH YOU AFTER SURGERY IN YOUR PRIVATE ROOM DURING VISITING HOURS ONLY!   YOUR COVID TEST IS COMPLETED, PLEASE BEGIN THE QUARANTINE INSTRUCTIONS AS OUTLINED IN YOUR HANDOUT.                [name]     Your procedure is scheduled on: 12-30-2019   Report to [facility] Main  Entrance   Report to admitting at 11:00AM     Call this number if you have problems the morning of surgery [phone]      Remember: [place]!   [place] [facility] PRESURGERY ENSURE DRINKS THE NIGHT BEFORE SURGERY AT 10:00 PM. NO SOLIDS AFTER MIDNIGHT THE NIGHT PRIOR TO THE SURGERY.  YOU MAY DRINK CLEAR LIQUIDS UNTIL __10:00am_____ THE NEXT MORNING (SEE DIET BELOW). AT ___10:00am_____ THE DAY OF SURGERY, FINISH THE LAST PRE-SURGERY DRINK. NOTHING BY MOUTH AFTER THE LAST ENSURE DRINK!     CLEAR LIQUID DIET   Foods Allowed                                                                     Foods Excluded  Coffee and tea, regular and decaf                             liquids that you cannot  Plain Jell-O any favor except red or purple                                           see through such as: Fruit ices (not with fruit pulp)                                     milk, soups, orange juice  Iced Popsicles                                    All solid food Carbonated beverages, regular and diet                                    Cranberry, grape and apple juices Sports drinks like Gatorade Lightly seasoned clear broth or consume(fat free) Sugar, honey syrup  Sample Menu Breakfast                                Lunch                                     Supper Cranberry juice  Beef broth                            Chicken broth Jell-O                                      Grape juice                           Apple juice Coffee or tea                        Jell-O                                      Popsicle                                                Coffee or tea                        Coffee or tea  _____________________________________________________________________   BRUSH YOUR TEETH MORNING OF SURGERY AND RINSE YOUR MOUTH OUT, NO CHEWING GUM CANDY OR MINTS.     Take these medicines the morning of surgery with A SIP OF WATER: NONE                                 You may not have any metal on your body including hair pins and              piercings  Do not wear jewelry, make-up, lotions, powders or perfumes, deodorant                          Men may shave face and neck.   Do not bring valuables to the hospital. [place].  Contacts, dentures or bridgework may not be worn into surgery.  YOU MAY BRING A SMALL OVERNIGHT BAG                 Please read over the following fact sheets you were given: _____________________________________________________________________             [facility] - Preparing for Surgery Before surgery, you can play an important role.  Because skin is not sterile, your skin needs to be as free of germs as possible.  You can reduce the number of germs on your skin by washing with CHG (chlorahexidine gluconate) soap before surgery.  CHG is an antiseptic cleaner which kills germs and bonds with the skin to continue killing germs even after washing. Please DO NOT use if you have an allergy to CHG or antibacterial soaps.  If your skin becomes reddened/irritated stop using the CHG and inform your nurse when you arrive at Short Stay. Do not shave (including legs and underarms) for at least 48 hours prior to the first CHG shower.  You may shave your face/neck. Please follow these  instructions carefully:  1.  Shower with CHG Soap the night before surgery and the  morning of  Surgery.  2.  If you choose to wash your hair, wash your hair first as usual with your  normal  shampoo.  3.  After you shampoo, rinse your hair and body thoroughly to remove the  shampoo.                           4.  Use CHG as you would any other liquid soap.  You can apply chg directly  to the skin and wash                       Gently with a scrungie or clean washcloth.  5.  Apply the CHG Soap to your body ONLY FROM THE NECK DOWN.   Do not use on face/ open                           Wound or open sores. Avoid contact with eyes, ears mouth and genitals (private parts).                       Wash face,  Genitals (private parts) with your normal soap.             6.  Wash thoroughly, paying special attention to the area where your surgery  will be performed.  7.  Thoroughly rinse your body with warm water from the neck down.  8.  DO NOT shower/wash with your normal soap after using and rinsing off  the CHG Soap.                9.  Pat yourself dry with a clean towel.            10.  Wear clean pajamas.            11.  Place clean sheets on your bed the night of your first shower and do not  sleep with pets. Day of Surgery : Do not apply any lotions/deodorants the morning of surgery.  Please wear clean clothes to the hospital/surgery center.  FAILURE TO FOLLOW THESE INSTRUCTIONS MAY RESULT IN THE CANCELLATION OF YOUR SURGERY PATIENT SIGNATURE_________________________________  NURSE SIGNATURE__________________________________  ________________________________________________________________________   Adam Phenix  An incentive spirometer is a tool that can help keep your lungs clear and active. This tool measures how well you are filling your lungs with each breath. Taking long deep breaths may help reverse or decrease the chance of developing breathing (pulmonary) problems (especially infection) following:  A long period of time when you are unable to move or be active. BEFORE  THE PROCEDURE   If the spirometer includes an indicator to show your best effort, your nurse or respiratory therapist will set it to a desired goal.  If possible, sit up straight or lean slightly forward. Try not to slouch.  Hold the incentive spirometer in an upright position. INSTRUCTIONS FOR USE  1. Sit on the edge of your bed if possible, or sit up as far as you can in bed or on a chair. 2. Hold the incentive spirometer in an upright position. 3. Breathe out normally. 4. Place the mouthpiece in your mouth and seal your lips tightly around it. 5. Breathe in slowly and as deeply as possible, raising the piston or the ball toward the  top of the column. 6. Hold your breath for 3-5 seconds or for as long as possible. Allow the piston or ball to fall to the bottom of the column. 7. Remove the mouthpiece from your mouth and breathe out normally. 8. Rest for a few seconds and repeat Steps 1 through 7 at least 10 times every 1-2 hours when you are awake. Take your time and take a few normal breaths between deep breaths. 9. The spirometer may include an indicator to show your best effort. Use the indicator as a goal to work toward during each repetition. 10. After each set of 10 deep breaths, practice coughing to be sure your lungs are clear. If you have an incision (the cut made at the time of surgery), support your incision when coughing by placing a pillow or rolled up towels firmly against it. Once you are able to get out of bed, walk around indoors and cough well. You may stop using the incentive spirometer when instructed by your caregiver.  RISKS AND COMPLICATIONS  Take your time so you do not get dizzy or light-headed.  If you are in pain, you may need to take or ask for pain medication before doing incentive spirometry. It is harder to take a deep breath if you are having pain. AFTER USE  Rest and breathe slowly and easily.  It can be helpful to keep track of a log of your progress.  Your caregiver can provide you with a simple table to help with this. If you are using the spirometer at home, follow these instructions: [place] IF:   You are having difficultly using the spirometer.  You have trouble using the spirometer as often as instructed.  Your pain medication is not giving enough relief while using the spirometer.  You develop fever of 100.5 F (38.1 C) or higher. SEEK IMMEDIATE MEDICAL CARE IF:   You cough up bloody sputum that had not been present before.  You develop fever of 102 F (38.9 C) or greater.  You develop worsening pain at or near the incision site. MAKE SURE YOU:   Understand these instructions.  Will watch your condition.  Will get help right away if you are not doing well or get worse. Document Released: 03/24/2007 Document Revised: 02/03/2012 Document Reviewed: 05/25/2007 [facility] Patient Information 2014 [place], [place].   ________________________________________________________________________

## 2019-12-28 ENCOUNTER — Other Ambulatory Visit: Payer: Self-pay

## 2019-12-28 ENCOUNTER — Encounter (HOSPITAL_COMMUNITY)
Admission: RE | Admit: 2019-12-28 | Discharge: 2019-12-28 | Disposition: A | Discharge status: Home / Self Care | Payer: BC Managed Care – PPO | Source: Ambulatory Visit | Attending: Surgery | Admitting: Surgery

## 2019-12-28 ENCOUNTER — Encounter (HOSPITAL_COMMUNITY): Payer: Self-pay

## 2019-12-28 DIAGNOSIS — Z01810 Encounter for preprocedural cardiovascular examination: Secondary | ICD-10-CM | POA: Insufficient documentation

## 2019-12-28 DIAGNOSIS — Z01812 Encounter for preprocedural laboratory examination: Secondary | ICD-10-CM | POA: Diagnosis present

## 2019-12-28 LAB — COMPREHENSIVE METABOLIC PANEL
ALT: 16 U/L (ref 0–44)
AST: 19 U/L (ref 15–41)
Albumin: 4.2 g/dL (ref 3.5–5.0)
Alkaline Phosphatase: 57 U/L (ref 38–126)
Anion gap: 7 (ref 5–15)
BUN: 18 mg/dL (ref 8–23)
CO2: 25 mmol/L (ref 22–32)
Calcium: 9.2 mg/dL (ref 8.9–10.3)
Chloride: 104 mmol/L (ref 98–111)
Creatinine, Ser: 0.9 mg/dL (ref 0.61–1.24)
GFR calc Af Amer: >60 mL/min (ref >60)
GFR calc non Af Amer: >60 mL/min (ref >60)
Glucose, Bld: 97 mg/dL (ref 70–99)
Potassium: 4 mmol/L (ref 3.5–5.1)
Sodium: 136 mmol/L (ref 135–145)
Total Bilirubin: 1.1 mg/dL (ref 0.3–1.2)
Total Protein: 8.3 g/dL — ABNORMAL HIGH (ref 6.5–8.1)

## 2019-12-28 LAB — CBC
HCT: 43.1 % (ref 39.0–52.0)
Hemoglobin: 14.1 g/dL (ref 13.0–17.0)
MCH: 29.2 pg (ref 26.0–34.0)
MCHC: 32.7 g/dL (ref 30.0–36.0)
MCV: 89.2 fL (ref 80.0–100.0)
Platelets: 256 10*3/uL (ref 150–400)
RBC: 4.83 MIL/uL (ref 4.22–5.81)
RDW: 13.2 % (ref 11.5–15.5)
WBC: 8.2 10*3/uL (ref 4.0–10.5)
nRBC: 0 % (ref 0.0–0.2)

## 2019-12-28 NOTE — Progress Notes (Signed)
PCP - Jani Gravel, MD Cardiologist -  Rheumatologist - Dr Leigh Aurora   Chest x-ray -  EKG - ordered for 12/28/2019 Stress Test -  ECHO -  Cardiac Cath -   Sleep Study - yes CPAP - not at this time , stopped using because of concerns of COVID  Fasting Blood Sugar -  Checks Blood Sugar _____ times a day  Blood Thinner Instructions: Aspirin Instructions: Last Dose:  Anesthesia review:  htn , rheumatoid arthritis (managed on methotrexate and [name] both on hold for upcoming surgery ) , OSA no CPAP, elevated LFTS due to methotrexate now controlled  Patient denies shortness of breath, fever, cough and chest pain at PAT appointment   Patient verbalized understanding of instructions that were given to them at the PAT appointment. Patient was also instructed that they will need to review over the PAT instructions again at home before surgery.

## 2019-12-29 ENCOUNTER — Encounter: Payer: Self-pay | Admitting: Internal Medicine

## 2019-12-29 ENCOUNTER — Ambulatory Visit (AMBULATORY_SURGERY_CENTER): Payer: BC Managed Care – PPO | Admitting: Internal Medicine

## 2019-12-29 VITALS — BP 122/76 | HR 69 | Temp 96.2°F | Resp 14 | Ht 65.75 in | Wt 213.2 lb

## 2019-12-29 DIAGNOSIS — K5669 Other partial intestinal obstruction: Secondary | ICD-10-CM

## 2019-12-29 DIAGNOSIS — K5732 Diverticulitis of large intestine without perforation or abscess without bleeding: Secondary | ICD-10-CM

## 2019-12-29 LAB — HEMOGLOBIN A1C
Hgb A1c MFr Bld: 5.3 % (ref 4.8–5.6)
Mean Plasma Glucose: 105.41 mg/dL

## 2019-12-29 MED ORDER — BUPIVACAINE LIPOSOME 1.3 % IJ SUSP
20.0000 mL | INTRAMUSCULAR | Status: DC
  Filled 2019-12-29: qty 20

## 2019-12-29 MED ORDER — SODIUM CHLORIDE 0.9 % IV SOLN
INTRAVENOUS | Status: DC
  Filled 2019-12-29: qty 6

## 2019-12-29 MED ORDER — SODIUM CHLORIDE 0.9 % IV SOLN: 500.0000 mL | Freq: Once | INTRAVENOUS | Status: DC

## 2019-12-29 NOTE — Consult Note (Signed)
[place] Nurse requested for preoperative stoma site marking  Discussed surgical procedure and stoma creation with patient.  Explained role of the [facility] nurse team.  Provided the patient with educational booklet and provided samples of pouching options.  Answered patient questions   Examined patient sitting, and standing in order to place the marking in the patient's visual field, away from any creases or abdominal contour issues and within the rectus muscle.   Marked for colostomy in the LLQ  _5___ cm to the left of the umbilicus and [id number] above the umbilicus.     Patient's abdomen cleansed with CHG wipes at site markings, allowed to air dry prior to marking.Covered mark with thin film transparent dressing to preserve mark until date of surgery.   [facility] Nurse team will follow up with patient after surgery for continue ostomy care and teaching.  [place] MSN, [place], [place], [place]

## 2019-12-29 NOTE — Progress Notes (Signed)
Report given to PACU, vss 

## 2019-12-29 NOTE — Progress Notes (Signed)
Temperature- [place]

## 2019-12-29 NOTE — Op Note (Signed)
[place] Patient Name: Nathaniel [name] Procedure Date: 12/29/2019 1:31 PM MRN: [id number] Endoscopist: Docia Chuck. Henrene Pastor , MD Age: 69 Referring MD:  Date of Birth: 1951-01-28 Gender: Male Account #: [id number] Procedure:                Colonoscopy Indications:              Abnormal CT of the GI tract. History of perforated                            diverticulitis. Anticipating surgery tomorrow                            (segmental sigmoid resection). Last colonoscopy 2015 Medicines:                Monitored Anesthesia Care Procedure:                Pre-Anesthesia Assessment:                           - Prior to the procedure, a History and Physical                            was performed, and patient medications and                            allergies were reviewed. The patient's tolerance of                            previous anesthesia was also reviewed. The risks                            and benefits of the procedure and the sedation                            options and risks were discussed with the patient.                            All questions were answered, and informed consent                            was obtained. Prior Anticoagulants: The patient has                            taken no previous anticoagulant or antiplatelet                            agents. ASA Grade Assessment: II - A patient with                            mild systemic disease. After reviewing the risks                            and benefits, the patient was deemed in  satisfactory condition to undergo the procedure.                           After obtaining informed consent, the colonoscope                            was passed under direct vision. Throughout the                            procedure, the patient's blood pressure, pulse, and                            oxygen saturations were monitored continuously. The                            Colonoscope was  introduced through the anus and                            advanced to the the cecum, identified by                            appendiceal orifice and ileocecal valve. The                            ileocecal valve, appendiceal orifice, and rectum                            were photographed. The quality of the bowel                            preparation was excellent. The colonoscopy was                            performed without difficulty. The patient tolerated                            the procedure well. The bowel preparation used was                            SUPREP via split dose instruction. Scope In: 1:40:06 PM Scope Out: 1:53:10 PM Scope Withdrawal Time: 0 hours 10 minutes 41 seconds  Total Procedure Duration: 0 hours 13 minutes 4 seconds  Findings:                 Multiple diverticula were found in the sigmoid                            colon. There was hyperemia of the mucosa and mild                            stenosis.                           The exam was otherwise without abnormality on  direct and retroflexion views. Complications:            No immediate complications. Estimated blood loss:                            None. Estimated Blood Loss:     Estimated blood loss: none. Impression:               - Diverticulosis in the sigmoid colon.                           - The examination was otherwise normal on direct                            and retroflexion views.                           - No specimens collected. Recommendation:           - Repeat colonoscopy in 10 years for surveillance.                           - Patient has a contact number available for                            emergencies. The signs and symptoms of potential                            delayed complications were discussed with the                            patient. Return to normal activities tomorrow.                            Written discharge instructions  were provided to the                            patient.                           - Resume previous diet.                           - Continue present medications.                           -Follow-up with Dr. Johney [place] regarding surgery                           -Desitin cream to the perianal area to help with                            itching Docia Chuck. Henrene Pastor, MD 12/29/2019 2:12:31 PM This report has been signed electronically.

## 2019-12-29 NOTE — Patient Instructions (Signed)
Diverticulosis noted. See handouts.    YOU HAD AN ENDOSCOPIC PROCEDURE TODAY AT [facility]:   Refer to the procedure report that was given to you for any specific questions about what was found during the examination.  If the procedure report does not answer your questions, please call your gastroenterologist to clarify.  If you requested that your care partner not be given the details of your procedure findings, then the procedure report has been included in a sealed envelope for you to review at your convenience later.  YOU SHOULD EXPECT: Some feelings of bloating in the abdomen. Passage of more gas than usual.  Walking can help get rid of the air that was put into your GI tract during the procedure and reduce the bloating. If you had a lower endoscopy (such as a colonoscopy or flexible sigmoidoscopy) you may notice spotting of blood in your stool or on the toilet paper. If you underwent a bowel prep for your procedure, you may not have a normal bowel movement for a few days.  Please Note:  You might notice some irritation and congestion in your nose or some drainage.  This is from the oxygen used during your procedure.  There is no need for concern and it should clear up in a day or so.  SYMPTOMS TO REPORT IMMEDIATELY:   Following lower endoscopy (colonoscopy or flexible sigmoidoscopy):  Excessive amounts of blood in the stool  Significant tenderness or worsening of abdominal pains  Swelling of the abdomen that is new, acute  Fever of 100F or higher   For urgent or emergent issues, a gastroenterologist can be reached at any hour by calling [phone].   DIET:  We do recommend a small meal at first, but then you may proceed to your regular diet.  Drink plenty of fluids but you should avoid alcoholic beverages for 24 hours.  ACTIVITY:  You should plan to take it easy for the rest of today and you should NOT DRIVE or use heavy machinery until tomorrow (because of the  sedation medicines used during the test).    FOLLOW UP: Our staff will call the number listed on your records 48-72 hours following your procedure to check on you and address any questions or concerns that you may have regarding the information given to you following your procedure. If we do not reach you, we will leave a message.  We will attempt to reach you two times.  During this call, we will ask if you have developed any symptoms of COVID 19. If you develop any symptoms (ie: fever, flu-like symptoms, shortness of breath, cough etc.) before then, please call [phone].  If you test positive for Covid 19 in the 2 weeks post procedure, please call and report this information to [name].    If any biopsies were taken you will be contacted by phone or by letter within the next 1-3 weeks.  Please call us at [phone] if you have not heard about the biopsies in 3 weeks.    SIGNATURES/CONFIDENTIALITY: You and/or your care partner have signed paperwork which will be entered into your electronic medical record.  These signatures attest to the fact that that the information above on your After Visit Summary has been reviewed and is understood.  Full responsibility of the confidentiality of this discharge information lies with you and/or your care-partner.

## 2019-12-30 ENCOUNTER — Other Ambulatory Visit: Payer: Self-pay

## 2019-12-30 ENCOUNTER — Encounter (HOSPITAL_COMMUNITY)
Admission: RE | Disposition: A | Discharge status: Home / Self Care | Payer: Self-pay | Source: Home / Self Care | Attending: Surgery

## 2019-12-30 ENCOUNTER — Inpatient Hospital Stay (HOSPITAL_COMMUNITY): Payer: BC Managed Care – PPO | Admitting: Physician Assistant

## 2019-12-30 ENCOUNTER — Inpatient Hospital Stay (HOSPITAL_COMMUNITY)
Admission: RE | Admit: 2019-12-30 | Discharge: 2020-01-01 | DRG: 329 | Disposition: A | Discharge status: Home / Self Care | Payer: BC Managed Care – PPO | Attending: Surgery | Admitting: Surgery

## 2019-12-30 ENCOUNTER — Encounter (HOSPITAL_COMMUNITY): Payer: Self-pay | Admitting: Surgery

## 2019-12-30 ENCOUNTER — Inpatient Hospital Stay (HOSPITAL_COMMUNITY): Payer: BC Managed Care – PPO | Admitting: Certified Registered Nurse Anesthetist

## 2019-12-30 DIAGNOSIS — K651 Peritoneal abscess: Secondary | ICD-10-CM | POA: Diagnosis present

## 2019-12-30 DIAGNOSIS — E669 Obesity, unspecified: Secondary | ICD-10-CM | POA: Diagnosis present

## 2019-12-30 DIAGNOSIS — I10 Essential (primary) hypertension: Secondary | ICD-10-CM | POA: Diagnosis present

## 2019-12-30 DIAGNOSIS — F410 Panic disorder [episodic paroxysmal anxiety] without agoraphobia: Secondary | ICD-10-CM | POA: Diagnosis present

## 2019-12-30 DIAGNOSIS — G4733 Obstructive sleep apnea (adult) (pediatric): Secondary | ICD-10-CM | POA: Diagnosis present

## 2019-12-30 DIAGNOSIS — Z20822 Contact with and (suspected) exposure to covid-19: Secondary | ICD-10-CM | POA: Diagnosis present

## 2019-12-30 DIAGNOSIS — Z79899 Other long term (current) drug therapy: Secondary | ICD-10-CM

## 2019-12-30 DIAGNOSIS — K219 Gastro-esophageal reflux disease without esophagitis: Secondary | ICD-10-CM | POA: Diagnosis present

## 2019-12-30 DIAGNOSIS — M069 Rheumatoid arthritis, unspecified: Secondary | ICD-10-CM | POA: Diagnosis present

## 2019-12-30 DIAGNOSIS — Z6834 Body mass index (BMI) 34.0-34.9, adult: Secondary | ICD-10-CM | POA: Diagnosis not present

## 2019-12-30 DIAGNOSIS — K5732 Diverticulitis of large intestine without perforation or abscess without bleeding: Secondary | ICD-10-CM | POA: Diagnosis present

## 2019-12-30 DIAGNOSIS — D84821 Immunodeficiency due to drugs: Secondary | ICD-10-CM | POA: Diagnosis present

## 2019-12-30 DIAGNOSIS — M1990 Unspecified osteoarthritis, unspecified site: Secondary | ICD-10-CM | POA: Diagnosis present

## 2019-12-30 DIAGNOSIS — K660 Peritoneal adhesions (postprocedural) (postinfection): Secondary | ICD-10-CM | POA: Diagnosis present

## 2019-12-30 DIAGNOSIS — Z9989 Dependence on other enabling machines and devices: Secondary | ICD-10-CM

## 2019-12-30 DIAGNOSIS — E663 Overweight: Secondary | ICD-10-CM | POA: Diagnosis present

## 2019-12-30 DIAGNOSIS — K5720 Diverticulitis of large intestine with perforation and abscess without bleeding: Principal | ICD-10-CM | POA: Diagnosis present

## 2019-12-30 HISTORY — PX: PROCTOSCOPY: SHX2266

## 2019-12-30 HISTORY — PX: XI ROBOTIC ASSISTED LOWER ANTERIOR RESECTION: SHX6558

## 2019-12-30 LAB — ABO/RH: ABO/RH(D): O POS

## 2019-12-30 LAB — TYPE AND SCREEN
ABO/RH(D): O POS
Antibody Screen: NEGATIVE

## 2019-12-30 SURGERY — RESECTION, RECTUM, LOW ANTERIOR, ROBOT-ASSISTED
Anesthesia: General | Site: Abdomen

## 2019-12-30 MED ORDER — ONDANSETRON HCL 4 MG/2ML IJ SOLN
INTRAMUSCULAR | Status: AC
  Filled 2019-12-30: qty 2

## 2019-12-30 MED ORDER — ENOXAPARIN SODIUM 40 MG/0.4ML ~~LOC~~ SOLN
40.0000 mg | SUBCUTANEOUS | Status: DC
  Administered 2019-12-31 – 2020-01-01 (×2): 40 mg via SUBCUTANEOUS
  Filled 2019-12-30 (×2): qty 0.4

## 2019-12-30 MED ORDER — FENTANYL CITRATE (PF) 100 MCG/2ML IJ SOLN: 25.0000 ug | INTRAMUSCULAR | Status: DC | PRN

## 2019-12-30 MED ORDER — ONDANSETRON HCL 4 MG/2ML IJ SOLN: 4.0000 mg | Freq: Four times a day (QID) | INTRAMUSCULAR | Status: DC | PRN

## 2019-12-30 MED ORDER — ACETAMINOPHEN 500 MG PO TABS
1000.0000 mg | ORAL_TABLET | ORAL | Status: AC
  Administered 2019-12-30: 12:00:00 1000 mg via ORAL
  Filled 2019-12-30: qty 2

## 2019-12-30 MED ORDER — GABAPENTIN 100 MG PO CAPS
200.0000 mg | ORAL_CAPSULE | Freq: Three times a day (TID) | ORAL | Status: DC
  Administered 2019-12-30 – 2019-12-31 (×4): 200 mg via ORAL
  Filled 2019-12-30 (×4): qty 2

## 2019-12-30 MED ORDER — SODIUM CHLORIDE 0.9 % IV SOLN
INTRAVENOUS | Status: DC | PRN
  Administered 2019-12-30: 1000 mL

## 2019-12-30 MED ORDER — LACTATED RINGERS IV SOLN: INTRAVENOUS | Status: DC

## 2019-12-30 MED ORDER — DEXAMETHASONE SODIUM PHOSPHATE 10 MG/ML IJ SOLN
INTRAMUSCULAR | Status: AC
  Filled 2019-12-30: qty 1

## 2019-12-30 MED ORDER — SODIUM CHLORIDE 0.9 % IV SOLN: Freq: Three times a day (TID) | INTRAVENOUS | Status: DC | PRN

## 2019-12-30 MED ORDER — SUGAMMADEX SODIUM 500 MG/5ML IV SOLN
INTRAVENOUS | Status: AC
  Filled 2019-12-30: qty 5

## 2019-12-30 MED ORDER — LIP MEDEX EX OINT
1.0000 "application " | TOPICAL_OINTMENT | Freq: Two times a day (BID) | CUTANEOUS | Status: DC
  Administered 2019-12-31 (×2): 1 via TOPICAL
  Filled 2019-12-30: qty 7

## 2019-12-30 MED ORDER — DEXAMETHASONE SODIUM PHOSPHATE 10 MG/ML IJ SOLN
INTRAMUSCULAR | Status: DC | PRN
  Administered 2019-12-30: 7 mg via INTRAVENOUS

## 2019-12-30 MED ORDER — FENTANYL CITRATE (PF) 250 MCG/5ML IJ SOLN
INTRAMUSCULAR | Status: AC
  Filled 2019-12-30: qty 5

## 2019-12-30 MED ORDER — FENTANYL CITRATE (PF) 250 MCG/5ML IJ SOLN
INTRAMUSCULAR | Status: DC | PRN
  Administered 2019-12-30 (×2): 50 ug via INTRAVENOUS
  Administered 2019-12-30: 100 ug via INTRAVENOUS
  Administered 2019-12-30 (×2): 50 ug via INTRAVENOUS
  Administered 2019-12-30 (×2): 100 ug via INTRAVENOUS

## 2019-12-30 MED ORDER — BUPIVACAINE LIPOSOME 1.3 % IJ SUSP
INTRAMUSCULAR | Status: DC | PRN
  Administered 2019-12-30: 20 mL

## 2019-12-30 MED ORDER — PROPOFOL 10 MG/ML IV BOLUS
INTRAVENOUS | Status: DC | PRN
  Administered 2019-12-30: 130 mg via INTRAVENOUS

## 2019-12-30 MED ORDER — EPHEDRINE SULFATE-NACL 50-0.9 MG/10ML-% IV SOSY
PREFILLED_SYRINGE | INTRAVENOUS | Status: DC | PRN
  Administered 2019-12-30 (×2): 5 mg via INTRAVENOUS
  Administered 2019-12-30 (×2): 10 mg via INTRAVENOUS
  Administered 2019-12-30 (×2): 5 mg via INTRAVENOUS

## 2019-12-30 MED ORDER — MIDAZOLAM HCL 5 MG/5ML IJ SOLN
INTRAMUSCULAR | Status: DC | PRN
  Administered 2019-12-30: 2 mg via INTRAVENOUS

## 2019-12-30 MED ORDER — DIPHENHYDRAMINE HCL 50 MG/ML IJ SOLN
INTRAMUSCULAR | Status: DC | PRN
  Administered 2019-12-30: 12.5 mg via INTRAVENOUS

## 2019-12-30 MED ORDER — DIPHENHYDRAMINE HCL 12.5 MG/5ML PO ELIX: 12.5000 mg | ORAL_SOLUTION | Freq: Four times a day (QID) | ORAL | Status: DC | PRN

## 2019-12-30 MED ORDER — HYDROMORPHONE HCL 1 MG/ML IJ SOLN: 0.5000 mg | INTRAMUSCULAR | Status: DC | PRN

## 2019-12-30 MED ORDER — LOSARTAN POTASSIUM 25 MG PO TABS
12.5000 mg | ORAL_TABLET | Freq: Every day | ORAL | Status: DC
  Administered 2019-12-31 – 2020-01-01 (×2): 12.5 mg via ORAL
  Filled 2019-12-30 (×2): qty 1

## 2019-12-30 MED ORDER — PROPOFOL 10 MG/ML IV BOLUS
INTRAVENOUS | Status: AC
  Filled 2019-12-30: qty 20

## 2019-12-30 MED ORDER — DIPHENHYDRAMINE HCL 50 MG/ML IJ SOLN
INTRAMUSCULAR | Status: AC
  Filled 2019-12-30: qty 1

## 2019-12-30 MED ORDER — ROCURONIUM BROMIDE 10 MG/ML (PF) SYRINGE
PREFILLED_SYRINGE | INTRAVENOUS | Status: AC
  Filled 2019-12-30: qty 10

## 2019-12-30 MED ORDER — VITAMIN D 25 MCG (1000 UNIT) PO TABS
1000.0000 [IU] | ORAL_TABLET | Freq: Every day | ORAL | Status: DC
  Administered 2019-12-31 – 2020-01-01 (×2): 1000 [IU] via ORAL
  Filled 2019-12-30 (×2): qty 1

## 2019-12-30 MED ORDER — ALVIMOPAN 12 MG PO CAPS
12.0000 mg | ORAL_CAPSULE | Freq: Two times a day (BID) | ORAL | Status: DC
  Administered 2019-12-31 (×2): 12 mg via ORAL
  Filled 2019-12-30 (×2): qty 1

## 2019-12-30 MED ORDER — PROCHLORPERAZINE MALEATE 10 MG PO TABS
10.0000 mg | ORAL_TABLET | Freq: Four times a day (QID) | ORAL | Status: DC | PRN
  Filled 2019-12-30: qty 1

## 2019-12-30 MED ORDER — SUGAMMADEX SODIUM 500 MG/5ML IV SOLN
INTRAVENOUS | Status: DC | PRN
  Administered 2019-12-30: 300 mg via INTRAVENOUS

## 2019-12-30 MED ORDER — PROMETHAZINE HCL 25 MG/ML IJ SOLN: 6.2500 mg | INTRAMUSCULAR | Status: DC | PRN

## 2019-12-30 MED ORDER — NEOMYCIN SULFATE 500 MG PO TABS
1000.0000 mg | ORAL_TABLET | ORAL | Status: DC
  Filled 2019-12-30: qty 2

## 2019-12-30 MED ORDER — BUPIVACAINE HCL (PF) 0.25 % IJ SOLN
INTRAMUSCULAR | Status: DC | PRN
  Administered 2019-12-30: 60 mL

## 2019-12-30 MED ORDER — POLYETHYLENE GLYCOL 3350 17 GM/SCOOP PO POWD
1.0000 | Freq: Once | ORAL | Status: DC
  Filled 2019-12-30: qty 255

## 2019-12-30 MED ORDER — SODIUM CHLORIDE 0.9 % IV SOLN
2.0000 g | Freq: Two times a day (BID) | INTRAVENOUS | Status: AC
  Administered 2019-12-30: 2 g via INTRAVENOUS
  Filled 2019-12-30: qty 2

## 2019-12-30 MED ORDER — KETAMINE HCL 10 MG/ML IJ SOLN
INTRAMUSCULAR | Status: DC | PRN
  Administered 2019-12-30: 30 mg via INTRAVENOUS

## 2019-12-30 MED ORDER — MAGIC MOUTHWASH
15.0000 mL | Freq: Four times a day (QID) | ORAL | Status: DC | PRN
  Filled 2019-12-30: qty 15

## 2019-12-30 MED ORDER — SODIUM CHLORIDE 0.9 % IV SOLN: 250.0000 mL | INTRAVENOUS | Status: DC | PRN

## 2019-12-30 MED ORDER — TRAMADOL HCL 50 MG PO TABS: 50.0000 mg | ORAL_TABLET | Freq: Four times a day (QID) | ORAL | 0 refills | Status: AC | PRN

## 2019-12-30 MED ORDER — LIDOCAINE 2% (20 MG/ML) 5 ML SYRINGE
INTRAMUSCULAR | Status: DC | PRN
  Administered 2019-12-30: 1.5 mg/kg/h via INTRAVENOUS

## 2019-12-30 MED ORDER — GABAPENTIN 300 MG PO CAPS
300.0000 mg | ORAL_CAPSULE | ORAL | Status: AC
  Administered 2019-12-30: 12:00:00 300 mg via ORAL
  Filled 2019-12-30: qty 1

## 2019-12-30 MED ORDER — SODIUM CHLORIDE 0.9 % IV SOLN
2.0000 g | INTRAVENOUS | Status: AC
  Administered 2019-12-30: 2 g via INTRAVENOUS
  Filled 2019-12-30: qty 2

## 2019-12-30 MED ORDER — VITAMIN E 180 MG (400 UNIT) PO CAPS
400.0000 [IU] | ORAL_CAPSULE | Freq: Every day | ORAL | Status: DC
  Administered 2019-12-31 – 2020-01-01 (×2): 400 [IU] via ORAL
  Filled 2019-12-30 (×2): qty 1

## 2019-12-30 MED ORDER — ROCURONIUM BROMIDE 50 MG/5ML IV SOSY
PREFILLED_SYRINGE | INTRAVENOUS | Status: DC | PRN
  Administered 2019-12-30: 15 mg via INTRAVENOUS
  Administered 2019-12-30: 50 mg via INTRAVENOUS
  Administered 2019-12-30: 15 mg via INTRAVENOUS
  Administered 2019-12-30: 20 mg via INTRAVENOUS

## 2019-12-30 MED ORDER — MIDAZOLAM HCL 2 MG/2ML IJ SOLN
INTRAMUSCULAR | Status: AC
  Filled 2019-12-30: qty 2

## 2019-12-30 MED ORDER — BISACODYL 5 MG PO TBEC
20.0000 mg | DELAYED_RELEASE_TABLET | Freq: Once | ORAL | Status: DC
  Filled 2019-12-30: qty 4

## 2019-12-30 MED ORDER — BUPIVACAINE HCL (PF) 0.25 % IJ SOLN
INTRAMUSCULAR | Status: AC
  Filled 2019-12-30: qty 60

## 2019-12-30 MED ORDER — TRAMADOL HCL 50 MG PO TABS
50.0000 mg | ORAL_TABLET | Freq: Four times a day (QID) | ORAL | Status: DC | PRN
  Administered 2019-12-31 (×2): 50 mg via ORAL
  Filled 2019-12-30 (×3): qty 1

## 2019-12-30 MED ORDER — DIPHENHYDRAMINE HCL 50 MG/ML IJ SOLN: 12.5000 mg | Freq: Four times a day (QID) | INTRAMUSCULAR | Status: DC | PRN

## 2019-12-30 MED ORDER — ONDANSETRON HCL 4 MG PO TABS: 4.0000 mg | ORAL_TABLET | Freq: Four times a day (QID) | ORAL | Status: DC | PRN

## 2019-12-30 MED ORDER — EPHEDRINE 5 MG/ML INJ
INTRAVENOUS | Status: AC
  Filled 2019-12-30: qty 10

## 2019-12-30 MED ORDER — PROCHLORPERAZINE EDISYLATE 10 MG/2ML IJ SOLN: 5.0000 mg | Freq: Four times a day (QID) | INTRAMUSCULAR | Status: DC | PRN

## 2019-12-30 MED ORDER — LACTATED RINGERS IV SOLN: INTRAVENOUS | Status: AC

## 2019-12-30 MED ORDER — SODIUM CHLORIDE 0.9% FLUSH
3.0000 mL | Freq: Two times a day (BID) | INTRAVENOUS | Status: DC
  Administered 2019-12-31 (×2): 3 mL via INTRAVENOUS

## 2019-12-30 MED ORDER — LACTATED RINGERS IR SOLN
Status: DC | PRN
  Administered 2019-12-30: 1000 mL

## 2019-12-30 MED ORDER — DICLOFENAC SODIUM 1 % EX GEL
2.0000 g | Freq: Three times a day (TID) | CUTANEOUS | Status: DC | PRN
  Filled 2019-12-30: qty 100

## 2019-12-30 MED ORDER — ALVIMOPAN 12 MG PO CAPS
12.0000 mg | ORAL_CAPSULE | ORAL | Status: AC
  Administered 2019-12-30: 12:00:00 12 mg via ORAL
  Filled 2019-12-30: qty 1

## 2019-12-30 MED ORDER — ALUM & MAG HYDROXIDE-SIMETH 200-200-20 MG/5ML PO SUSP: 30.0000 mL | Freq: Four times a day (QID) | ORAL | Status: DC | PRN

## 2019-12-30 MED ORDER — LIDOCAINE HCL 2 % IJ SOLN
INTRAMUSCULAR | Status: AC
  Filled 2019-12-30: qty 20

## 2019-12-30 MED ORDER — ALBUMIN HUMAN 5 % IV SOLN: INTRAVENOUS | Status: DC | PRN

## 2019-12-30 MED ORDER — KETAMINE HCL 10 MG/ML IJ SOLN
INTRAMUSCULAR | Status: AC
  Filled 2019-12-30: qty 1

## 2019-12-30 MED ORDER — ONDANSETRON HCL 4 MG/2ML IJ SOLN
INTRAMUSCULAR | Status: DC | PRN
  Administered 2019-12-30: 4 mg via INTRAVENOUS

## 2019-12-30 MED ORDER — ENSURE SURGERY PO LIQD
237.0000 mL | Freq: Two times a day (BID) | ORAL | Status: DC
  Administered 2019-12-31 – 2020-01-01 (×3): 237 mL via ORAL
  Filled 2019-12-30 (×4): qty 237

## 2019-12-30 MED ORDER — FOLIC ACID 1 MG PO TABS
1.0000 mg | ORAL_TABLET | Freq: Every day | ORAL | Status: DC
  Administered 2019-12-31 – 2020-01-01 (×2): 1 mg via ORAL
  Filled 2019-12-30 (×2): qty 1

## 2019-12-30 MED ORDER — LIDOCAINE 2% (20 MG/ML) 5 ML SYRINGE
INTRAMUSCULAR | Status: DC | PRN
  Administered 2019-12-30: 60 mg via INTRAVENOUS

## 2019-12-30 MED ORDER — METOPROLOL TARTRATE 5 MG/5ML IV SOLN: 5.0000 mg | Freq: Four times a day (QID) | INTRAVENOUS | Status: DC | PRN

## 2019-12-30 MED ORDER — 0.9 % SODIUM CHLORIDE (POUR BTL) OPTIME
TOPICAL | Status: DC | PRN
  Administered 2019-12-30: 2000 mL

## 2019-12-30 MED ORDER — LIDOCAINE 2% (20 MG/ML) 5 ML SYRINGE
INTRAMUSCULAR | Status: AC
  Filled 2019-12-30: qty 5

## 2019-12-30 MED ORDER — METRONIDAZOLE 500 MG PO TABS
1000.0000 mg | ORAL_TABLET | ORAL | Status: DC
  Filled 2019-12-30: qty 2

## 2019-12-30 MED ORDER — ACETAMINOPHEN 500 MG PO TABS
1000.0000 mg | ORAL_TABLET | Freq: Four times a day (QID) | ORAL | Status: DC
  Administered 2019-12-31 – 2020-01-01 (×4): 1000 mg via ORAL
  Filled 2019-12-30 (×4): qty 2

## 2019-12-30 MED ORDER — CO Q-10 100 MG PO CAPS: 100.0000 mg | ORAL_CAPSULE | Freq: Every day | ORAL | Status: DC

## 2019-12-30 MED ORDER — SODIUM CHLORIDE 0.9% FLUSH: 3.0000 mL | INTRAVENOUS | Status: DC | PRN

## 2019-12-30 MED ORDER — ENOXAPARIN SODIUM 40 MG/0.4ML ~~LOC~~ SOLN
40.0000 mg | Freq: Once | SUBCUTANEOUS | Status: AC
  Administered 2019-12-30: 12:00:00 40 mg via SUBCUTANEOUS
  Filled 2019-12-30: qty 0.4

## 2019-12-30 SURGICAL SUPPLY — 117 items
APL PRP STRL LF DISP 70% ISPRP (MISCELLANEOUS) ×2
APPLIER CLIP 5 13 M/L LIGAMAX5 (MISCELLANEOUS)
APPLIER CLIP ROT 10 11.4 M/L (STAPLE)
APR CLP MED LRG 11.4X10 (STAPLE)
APR CLP MED LRG 5 ANG JAW (MISCELLANEOUS)
BLADE EXTENDED COATED 6.5IN (ELECTRODE) ×2 IMPLANT
CANNULA REDUC XI 12-8 STAPL (CANNULA) ×1
CANNULA REDUC XI 12-8MM STAPL (CANNULA) ×1
CANNULA REDUCER 12-8 DVNC XI (CANNULA) ×2 IMPLANT
CELLS DAT CNTRL 66122 CELL SVR (MISCELLANEOUS) IMPLANT
CHLORAPREP W/TINT 26 (MISCELLANEOUS) ×4 IMPLANT
CLIP APPLIE 5 13 M/L LIGAMAX5 (MISCELLANEOUS) IMPLANT
CLIP APPLIE ROT 10 11.4 M/L (STAPLE) IMPLANT
CLIP VESOLOCK LG 6/CT PURPLE (CLIP) IMPLANT
CLIP VESOLOCK MED LG 6/CT (CLIP) IMPLANT
COVER SURGICAL LIGHT HANDLE (MISCELLANEOUS) ×8 IMPLANT
COVER TIP SHEARS 8 DVNC (MISCELLANEOUS) ×2 IMPLANT
COVER TIP SHEARS 8MM DA VINCI (MISCELLANEOUS) ×2
COVER WAND RF STERILE (DRAPES) IMPLANT
DEVICE TROCAR PUNCTURE CLOSURE (ENDOMECHANICALS) IMPLANT
DRAIN CHANNEL 19F RND (DRAIN) ×2 IMPLANT
DRAPE ARM DVNC X/XI (DISPOSABLE) ×8 IMPLANT
DRAPE COLUMN DVNC XI (DISPOSABLE) ×2 IMPLANT
DRAPE DA VINCI XI ARM (DISPOSABLE) ×8
DRAPE DA VINCI XI COLUMN (DISPOSABLE) ×2
DRAPE SURG IRRIG POUCH 19X23 (DRAPES) ×4 IMPLANT
DRSG OPSITE POSTOP 4X10 (GAUZE/BANDAGES/DRESSINGS) IMPLANT
DRSG OPSITE POSTOP 4X6 (GAUZE/BANDAGES/DRESSINGS) ×2 IMPLANT
DRSG OPSITE POSTOP 4X8 (GAUZE/BANDAGES/DRESSINGS) IMPLANT
DRSG TEGADERM 2-3/8X2-3/4 SM (GAUZE/BANDAGES/DRESSINGS) ×12 IMPLANT
DRSG TEGADERM 4X4.75 (GAUZE/BANDAGES/DRESSINGS) ×4 IMPLANT
ELECT REM PT RETURN 15FT ADLT (MISCELLANEOUS) ×4 IMPLANT
ENDOLOOP SUT PDS II  0 18 (SUTURE)
ENDOLOOP SUT PDS II 0 18 (SUTURE) IMPLANT
EVACUATOR SILICONE 100CC (DRAIN) ×2 IMPLANT
GAUZE SPONGE 2X2 8PLY STRL LF (GAUZE/BANDAGES/DRESSINGS) ×2 IMPLANT
GAUZE SPONGE 4X4 12PLY STRL (GAUZE/BANDAGES/DRESSINGS) IMPLANT
GLOVE ECLIPSE 8.0 STRL XLNG CF (GLOVE) ×20 IMPLANT
GLOVE INDICATOR 8.0 STRL GRN (GLOVE) ×20 IMPLANT
GOWN STRL REUS W/TWL XL LVL3 (GOWN DISPOSABLE) ×20 IMPLANT
GRASPER SUT TROCAR 14GX15 (MISCELLANEOUS) ×4 IMPLANT
HOLDER FOLEY CATH W/STRAP (MISCELLANEOUS) ×4 IMPLANT
IRRIG SUCT STRYKERFLOW 2 WTIP (MISCELLANEOUS) ×4
IRRIGATION SUCT STRKRFLW 2 WTP (MISCELLANEOUS) IMPLANT
KIT PROCEDURE DA VINCI SI (MISCELLANEOUS)
KIT PROCEDURE DVNC SI (MISCELLANEOUS) IMPLANT
KIT SIGMOIDOSCOPE (SET/KITS/TRAYS/PACK) ×2 IMPLANT
KIT TURNOVER KIT A (KITS) IMPLANT
NDL INSUFFLATION 14GA 120MM (NEEDLE) ×2 IMPLANT
NEEDLE INSUFFLATION 14GA 120MM (NEEDLE) ×4 IMPLANT
PACK CARDIOVASCULAR III (CUSTOM PROCEDURE TRAY) ×4 IMPLANT
PACK COLON (CUSTOM PROCEDURE TRAY) ×4 IMPLANT
PAD POSITIONING PINK XL (MISCELLANEOUS) ×4 IMPLANT
PENCIL SMOKE EVACUATOR (MISCELLANEOUS) IMPLANT
PORT LAP GEL ALEXIS MED 5-9CM (MISCELLANEOUS) ×4 IMPLANT
PROTECTOR NERVE ULNAR (MISCELLANEOUS) ×6 IMPLANT
RELOAD STAPLE 45 BLU REG DVNC (STAPLE) IMPLANT
RELOAD STAPLE 45 GRN THCK DVNC (STAPLE) IMPLANT
RELOAD STAPLE 60 4.3 GRN DVNC (STAPLE) IMPLANT
RELOAD STAPLER 4.3X60 GRN DVNC (STAPLE) ×4 IMPLANT
RETRACTOR WND ALEXIS 18 MED (MISCELLANEOUS) IMPLANT
RTRCTR WOUND ALEXIS 18CM MED (MISCELLANEOUS)
SCISSORS LAP 5X35 DISP (ENDOMECHANICALS) ×4 IMPLANT
SEAL CANN UNIV 5-8 DVNC XI (MISCELLANEOUS) ×8 IMPLANT
SEAL XI 5MM-8MM UNIVERSAL (MISCELLANEOUS) ×8
SEALER VESSEL DA VINCI XI (MISCELLANEOUS) ×2
SEALER VESSEL EXT DVNC XI (MISCELLANEOUS) ×2 IMPLANT
SLEEVE ADV FIXATION 5X100MM (TROCAR) IMPLANT
SOLUTION ELECTROLUBE (MISCELLANEOUS) ×4 IMPLANT
SPONGE GAUZE 2X2 STER 10/PKG (GAUZE/BANDAGES/DRESSINGS) ×2
STAPLER 45 BLU RELOAD XI (STAPLE) IMPLANT
STAPLER 45 BLUE RELOAD XI (STAPLE)
STAPLER 45 DA VINCI SURE FORM (STAPLE)
STAPLER 45 GREEN RELOAD XI (STAPLE)
STAPLER 45 GRN RELOAD XI (STAPLE) IMPLANT
STAPLER 45 SUREFORM DVNC (STAPLE) IMPLANT
STAPLER 60 DA VINCI SURE FORM (STAPLE) ×2
STAPLER 60 SUREFORM DVNC (STAPLE) IMPLANT
STAPLER CANNULA SEAL DVNC XI (STAPLE) ×2 IMPLANT
STAPLER CANNULA SEAL XI (STAPLE) ×2
STAPLER ECHELON POWER CIR 31 (STAPLE) ×2 IMPLANT
STAPLER RELOAD 4.3X60 GREEN (STAPLE) ×4
STAPLER RELOAD 4.3X60 GRN DVNC (STAPLE) ×4
STAPLER SHEATH (SHEATH)
STAPLER SHEATH ENDOWRIST DVNC (SHEATH) ×2 IMPLANT
STOPCOCK 4 WAY LG BORE MALE ST (IV SETS) ×8 IMPLANT
SURGILUBE 2OZ TUBE FLIPTOP (MISCELLANEOUS) ×4 IMPLANT
SUT MNCRL AB 4-0 PS2 18 (SUTURE) ×4 IMPLANT
SUT PDS AB 1 CT1 27 (SUTURE) ×6 IMPLANT
SUT PDS AB 1 TP1 96 (SUTURE) IMPLANT
SUT PROLENE 0 CT 2 (SUTURE) IMPLANT
SUT PROLENE 2 0 KS (SUTURE) ×2 IMPLANT
SUT PROLENE 2 0 SH DA (SUTURE) IMPLANT
SUT SILK 0 (SUTURE) ×4
SUT SILK 0 30XBRD TIE 6 (SUTURE) IMPLANT
SUT SILK 2 0 (SUTURE)
SUT SILK 2 0 SH CR/8 (SUTURE) IMPLANT
SUT SILK 2-0 18XBRD TIE 12 (SUTURE) IMPLANT
SUT SILK 3 0 (SUTURE) ×4
SUT SILK 3 0 SH CR/8 (SUTURE) ×6 IMPLANT
SUT SILK 3-0 18XBRD TIE 12 (SUTURE) ×2 IMPLANT
SUT V-LOC BARB 180 2/0GR6 GS22 (SUTURE)
SUT VIC AB 3-0 SH 18 (SUTURE) IMPLANT
SUT VIC AB 3-0 SH 27 (SUTURE)
SUT VIC AB 3-0 SH 27XBRD (SUTURE) IMPLANT
SUT VICRYL 0 UR6 27IN ABS (SUTURE) ×4 IMPLANT
SUTURE V-LC BRB 180 2/0GR6GS22 (SUTURE) IMPLANT
SYR 10ML ECCENTRIC (SYRINGE) ×4 IMPLANT
SYS LAPSCP GELPORT 120MM (MISCELLANEOUS)
SYSTEM LAPSCP GELPORT 120MM (MISCELLANEOUS) IMPLANT
TAPE UMBILICAL COTTON 1/8X30 (MISCELLANEOUS) ×4 IMPLANT
TOWEL OR NON WOVEN STRL DISP B (DISPOSABLE) ×4 IMPLANT
TRAY FOLEY MTR SLVR 16FR STAT (SET/KITS/TRAYS/PACK) ×4 IMPLANT
TROCAR ADV FIXATION 5X100MM (TROCAR) ×4 IMPLANT
TUBING CONNECTING 10 (TUBING) ×6 IMPLANT
TUBING CONNECTING 10' (TUBING) ×2
TUBING INSUFFLATION 10FT LAP (TUBING) ×4 IMPLANT

## 2019-12-30 NOTE — Anesthesia Procedure Notes (Signed)
Performed by: Cynda Familia, CRNA Oxygen Delivery Method: Simple face mask Placement Confirmation: breath sounds checked- equal and bilateral Dental Injury: Teeth and Oropharynx as per pre-operative assessment

## 2019-12-30 NOTE — Anesthesia Procedure Notes (Signed)
Procedure Name: Intubation Date/Time: 12/30/2019 1:39 PM Performed by: Maxwell Caul, CRNA Pre-anesthesia Checklist: Patient identified, Emergency Drugs available, Suction available and Patient being monitored Patient Re-evaluated:Patient Re-evaluated prior to induction Oxygen Delivery Method: Circle system utilized Preoxygenation: Pre-oxygenation with 100% oxygen Induction Type: IV induction Ventilation: Mask ventilation without difficulty Laryngoscope Size: Mac and 4 Grade View: Grade II Tube type: Oral Tube size: 7.5 mm Number of attempts: 1 Airway Equipment and Method: Stylet Placement Confirmation: ETT inserted through vocal cords under direct vision,  positive ETCO2 and breath sounds checked- equal and bilateral Secured at: 22 cm Tube secured with: Tape Dental Injury: Teeth and Oropharynx as per pre-operative assessment

## 2019-12-30 NOTE — Anesthesia Preprocedure Evaluation (Signed)
Anesthesia Evaluation  Patient identified by MRN, date of birth, ID band Patient awake    Reviewed: Allergy & Precautions, NPO status , Patient's Chart, lab work & pertinent test results  Airway Mallampati: II  TM Distance: >3 FB Neck ROM: Full    Dental  (+) Teeth Intact, Dental Advisory Given   Pulmonary sleep apnea and Continuous Positive Airway Pressure Ventilation ,    Pulmonary exam normal breath sounds clear to auscultation       Cardiovascular hypertension, Pt. on medications (-) angina(-) Past MI Normal cardiovascular exam Rhythm:Regular Rate:Normal     Neuro/Psych PSYCHIATRIC DISORDERS Anxiety negative neurological ROS     GI/Hepatic Neg liver ROS, GERD  ,DIVERTICULITIS   Endo/Other  Obesity   Renal/GU negative Renal ROS     Musculoskeletal  (+) Arthritis ,   Abdominal   Peds  Hematology negative hematology ROS (+)   Anesthesia Other Findings Day of surgery medications reviewed with the patient.  Reproductive/Obstetrics                             Anesthesia Physical Anesthesia Plan  ASA: II  Anesthesia Plan: General   Post-op Pain Management:    Induction: Intravenous  PONV Risk Score and Plan: 4 or greater and Diphenhydramine, Midazolam, Dexamethasone and Ondansetron  Airway Management Planned: Oral ETT  Additional Equipment:   Intra-op Plan:   Post-operative Plan: Extubation in OR  Informed Consent: I have reviewed the patients History and Physical, chart, labs and discussed the procedure including the risks, benefits and alternatives for the proposed anesthesia with the patient or authorized representative who has indicated his/her understanding and acceptance.     Dental advisory given  Plan Discussed with: CRNA  Anesthesia Plan Comments:         Anesthesia Quick Evaluation

## 2019-12-30 NOTE — Discharge Instructions (Signed)
SURGERY: POST OP INSTRUCTIONS (Surgery for small bowel obstruction, colon resection, etc)   ######################################################################  EAT Gradually transition to a high fiber diet with a fiber supplement over the next few days after discharge  WALK Walk an hour a day.  Control your pain to do that.    CONTROL PAIN Control pain so that you can walk, sleep, tolerate sneezing/coughing, go up/down stairs.  HAVE A BOWEL MOVEMENT DAILY Keep your bowels regular to avoid problems.  OK to try a laxative to override constipation.  OK to use an antidairrheal to slow down diarrhea.  Call if not better after 2 tries  CALL IF YOU HAVE PROBLEMS/CONCERNS Call if you are still struggling despite following these instructions. Call if you have concerns not answered by these instructions  ######################################################################   DIET Follow a light diet the first few days at home.  Start with a bland diet such as soups, liquids, starchy foods, low fat foods, etc.  If you feel full, bloated, or constipated, stay on a ful liquid or pureed/blenderized diet for a few days until you feel better and no longer constipated. Be sure to drink plenty of fluids every day to avoid getting dehydrated (feeling dizzy, not urinating, etc.). Gradually add a fiber supplement to your diet over the next week.  Gradually get back to a regular solid diet.  Avoid fast food or heavy meals the first week as you are more likely to get nauseated. It is expected for your digestive tract to need a few months to get back to normal.  It is common for your bowel movements and stools to be irregular.  You will have occasional bloating and cramping that should eventually fade away.  Until you are eating solid food normally, off all pain medications, and back to regular activities; your bowels will not be normal. Focus on eating a low-fat, high fiber diet the rest of your life  (See Getting to Good Bowel Health, below).  CARE of your INCISION or WOUND It is good for closed incision and even open wounds to be washed every day.  Shower every day.  Short baths are fine.  Wash the incisions and wounds clean with soap & water.    If you have a closed incision(s), wash the incision with soap & water every day.  You may leave closed incisions open to air if it is dry.   You may cover the incision with clean gauze & replace it after your daily shower for comfort. If you have skin tapes (Steristrips) or skin glue (Dermabond) on your incision, leave them in place.  They will fall off on their own like a scab.  You may trim any edges that curl up with clean scissors.  If you have staples, set up an appointment for them to be removed in the office in 10 days after surgery.  If you have a drain, wash around the skin exit site with soap & water and place a new dressing of gauze or band aid around the skin every day.  Keep the drain site clean & dry.    If you have an open wound with packing, see wound care instructions.  In general, it is encouraged that you remove your dressing and packing, shower with soap & water, and replace your dressing once a day.  Pack the wound with clean gauze moistened with normal (0.9%) saline to keep the wound moist & uninfected.  Pressure on the dressing for 30 minutes will stop most wound   bleeding.  Eventually your body will heal & pull the open wound closed over the next few months.  Raw open wounds will occasionally bleed or secrete yellow drainage until it heals closed.  Drain sites will drain a little until the drain is removed.  Even closed incisions can have mild bleeding or drainage the first few days until the skin edges scab over & seal.   If you have an open wound with a wound vac, see wound vac care instructions.     ACTIVITIES as tolerated Start light daily activities --- self-care, walking, climbing stairs-- beginning the day after surgery.   Gradually increase activities as tolerated.  Control your pain to be active.  Stop when you are tired.  Ideally, walk several times a day, eventually an hour a day.   Most people are back to most day-to-day activities in a few weeks.  It takes 4-8 weeks to get back to unrestricted, intense activity. If you can walk 30 minutes without difficulty, it is safe to try more intense activity such as jogging, treadmill, bicycling, low-impact aerobics, swimming, etc. Save the most intensive and strenuous activity for last (Usually 4-8 weeks after surgery) such as sit-ups, heavy lifting, contact sports, etc.  Refrain from any intense heavy lifting or straining until you are off narcotics for pain control.  You will have off days, but things should improve week-by-week. DO NOT PUSH THROUGH PAIN.  Let pain be your guide: If it hurts to do something, don't do it.  Pain is your body warning you to avoid that activity for another week until the pain goes down. You may drive when you are no longer taking narcotic prescription pain medication, you can comfortably wear a seatbelt, and you can safely make sudden turns/stops to protect yourself without hesitating due to pain. You may have sexual intercourse when it is comfortable. If it hurts to do something, stop.  MEDICATIONS Take your usually prescribed home medications unless otherwise directed.   Blood thinners:  Usually you can restart any strong blood thinners after the second postoperative day.  It is OK to take aspirin right away.     If you are on strong blood thinners (warfarin/Coumadin, Plavix, Xerelto, Eliquis, Pradaxa, etc), discuss with your surgeon, medicine PCP, and/or cardiologist for instructions on when to restart the blood thinner & if blood monitoring is needed (PT/INR blood check, etc).     PAIN CONTROL Pain after surgery or related to activity is often due to strain/injury to muscle, tendon, nerves and/or incisions.  This pain is usually  short-term and will improve in a few months.  To help speed the process of healing and to get back to regular activity more quickly, DO THE FOLLOWING THINGS TOGETHER: 1. Increase activity gradually.  DO NOT PUSH THROUGH PAIN 2. Use Ice and/or Heat 3. Try Gentle Massage and/or Stretching 4. Take over the counter pain medication 5. Take Narcotic prescription pain medication for more severe pain  Good pain control = faster recovery.  It is better to take more medicine to be more active than to stay in bed all day to avoid medications. 1.  Increase activity gradually Avoid heavy lifting at first, then increase to lifting as tolerated over the next 6 weeks. Do not "push through" the pain.  Listen to your body and avoid positions and maneuvers than reproduce the pain.  Wait a few days before trying something more intense Walking an hour a day is encouraged to help your body recover faster   and more safely.  Start slowly and stop when getting sore.  If you can walk 30 minutes without stopping or pain, you can try more intense activity (running, jogging, aerobics, cycling, swimming, treadmill, sex, sports, weightlifting, etc.) Remember: If it hurts to do it, then don't do it! 2. Use Ice and/or Heat You will have swelling and bruising around the incisions.  This will take several weeks to resolve. Ice packs or heating pads (6-8 times a day, 30-60 minutes at a time) will help sooth soreness & bruising. Some people prefer to use ice alone, heat alone, or alternate between ice & heat.  Experiment and see what works best for you.  Consider trying ice for the first few days to help decrease swelling and bruising; then, switch to heat to help relax sore spots and speed recovery. Shower every day.  Short baths are fine.  It feels good!  Keep the incisions and wounds clean with soap & water.   3. Try Gentle Massage and/or Stretching Massage at the area of pain many times a day Stop if you feel pain - do not  overdo it 4. Take over the counter pain medication This helps the muscle and nerve tissues become less irritable and calm down faster Choose ONE of the following over-the-counter anti-inflammatory medications: Acetaminophen 500mg tabs (Tylenol) 1-2 pills with every meal and just before bedtime (avoid if you have liver problems or if you have acetaminophen in you narcotic prescription) Naproxen 220mg tabs (ex. Aleve, Naprosyn) 1-2 pills twice a day (avoid if you have kidney, stomach, IBD, or bleeding problems) Ibuprofen 200mg tabs (ex. Advil, Motrin) 3-4 pills with every meal and just before bedtime (avoid if you have kidney, stomach, IBD, or bleeding problems) Take with food/snack several times a day as directed for at least 2 weeks to help keep pain / soreness down & more manageable. 5. Take Narcotic prescription pain medication for more severe pain A prescription for strong pain control is often given to you upon discharge (for example: oxycodone/Percocet, hydrocodone/Norco/Vicodin, or tramadol/Ultram) Take your pain medication as prescribed. Be mindful that most narcotic prescriptions contain Tylenol (acetaminophen) as well - avoid taking too much Tylenol. If you are having problems/concerns with the prescription medicine (does not control pain, nausea, vomiting, rash, itching, etc.), please call us [phone] to see if we need to switch you to a different pain medicine that will work better for you and/or control your side effects better. If you need a refill on your pain medication, you must call the office before 4 pm and on weekdays only.  By federal law, prescriptions for narcotics cannot be called into a pharmacy.  They must be filled out on paper & picked up from our office by the patient or authorized caretaker.  Prescriptions cannot be filled after 4 pm nor on weekends.    WHEN TO CALL US [phone] Severe uncontrolled or worsening pain  Fever over 101 F (38.5 C) Concerns with  the incision: Worsening pain, redness, rash/hives, swelling, bleeding, or drainage Reactions / problems with new medications (itching, rash, hives, nausea, etc.) Nausea and/or vomiting Difficulty urinating Difficulty breathing Worsening fatigue, dizziness, lightheadedness, blurred vision Other concerns If you are not getting better after two weeks or are noticing you are getting worse, contact our office [phone] for further advice.  We may need to adjust your medications, re-evaluate you in the office, send you to the emergency room, or see what other things we can do to help. The   clinic staff is available to answer your questions during regular business hours (8:30am-5pm).  Please don't hesitate to call and ask to speak to one of our nurses for clinical concerns.    A surgeon from [facility] is always on call at the hospitals 24 hours/day If you have a medical emergency, go to the nearest emergency room or call 911.  FOLLOW UP in our office One the day of your discharge from the hospital (or the next business weekday), please call [facility] to set up or confirm an appointment to see your surgeon in the office for a follow-up appointment.  Usually it is 2-3 weeks after your surgery.   If you have skin staples at your incision(s), let the office know so we can set up a time in the office for the nurse to remove them (usually around 10 days after surgery). Make sure that you call for appointments the day of discharge (or the next business weekday) from the hospital to ensure a convenient appointment time. IF YOU HAVE DISABILITY OR FAMILY LEAVE FORMS, BRING THEM TO THE OFFICE FOR PROCESSING.  DO NOT GIVE THEM TO YOUR DOCTOR.  [facility] [address], [address], , [place]  [address] ? [phone] - Main [phone] - Toll Free,  [phone] - Fax [email or web address]  GETTING TO GOOD BOWEL HEALTH. It is  expected for your digestive tract to need a few months to get back to normal.  It is common for your bowel movements and stools to be irregular.  You will have occasional bloating and cramping that should eventually fade away.  Until you are eating solid food normally, off all pain medications, and back to regular activities; your bowels will not be normal.   Avoiding constipation The goal: ONE SOFT BOWEL MOVEMENT A DAY!    Drink plenty of fluids.  Choose water first. TAKE A FIBER SUPPLEMENT EVERY DAY THE REST OF YOUR LIFE During your first week back home, gradually add back a fiber supplement every day Experiment which form you can tolerate.   There are many forms such as powders, tablets, wafers, gummies, etc Psyllium bran (Metamucil), methylcellulose (Citrucel), Miralax or Glycolax, Benefiber, Flax Seed.  Adjust the dose week-by-week (1/2 dose/day to 6 doses a day) until you are moving your bowels 1-2 times a day.  Cut back the dose or try a different fiber product if it is giving you problems such as diarrhea or bloating. Sometimes a laxative is needed to help jump-start bowels if constipated until the fiber supplement can help regulate your bowels.  If you are tolerating eating & you are farting, it is okay to try a gentle laxative such as double dose MiraLax, prune juice, or Milk of Magnesia.  Avoid using laxatives too often. Stool softeners can sometimes help counteract the constipating effects of narcotic pain medicines.  It can also cause diarrhea, so avoid using for too long. If you are still constipated despite taking fiber daily, eating solids, and a few doses of laxatives, call our office. Controlling diarrhea Try drinking liquids and eating bland foods for a few days to avoid stressing your intestines further. Avoid dairy products (especially milk & ice cream) for a short time.  The intestines often can lose the ability to digest lactose when stressed. Avoid foods that cause gassiness or  bloating.  Typical foods include beans and other legumes, cabbage, broccoli, and dairy foods.  Avoid greasy, spicy, fast foods.  Every person has   some sensitivity to other foods, so listen to your body and avoid those foods that trigger problems for you. Probiotics (such as active yogurt, Align, etc) may help repopulate the intestines and colon with normal bacteria and calm down a sensitive digestive tract Adding a fiber supplement gradually can help thicken stools by absorbing excess fluid and retrain the intestines to act more normally.  Slowly increase the dose over a few weeks.  Too much fiber too soon can backfire and cause cramping & bloating. It is okay to try and slow down diarrhea with a few doses of antidiarrheal medicines.   Bismuth subsalicylate (ex. Kayopectate, Pepto Bismol) for a few doses can help control diarrhea.  Avoid if pregnant.   Loperamide (Imodium) can slow down diarrhea.  Start with one tablet (2mg) first.  Avoid if you are having fevers or severe pain.  ILEOSTOMY PATIENTS WILL HAVE CHRONIC DIARRHEA since their colon is not in use.    Drink plenty of liquids.  You will need to drink even more glasses of water/liquid a day to avoid getting dehydrated. Record output from your ileostomy.  Expect to empty the bag every 3-4 hours at first.  Most people with a permanent ileostomy empty their bag 4-6 times at the least.   Use antidiarrheal medicine (especially Imodium) several times a day to avoid getting dehydrated.  Start with a dose at bedtime & breakfast.  Adjust up or down as needed.  Increase antidiarrheal medications as directed to avoid emptying the bag more than 8 times a day (every 3 hours). Work with your wound ostomy nurse to learn care for your ostomy.  See ostomy care instructions. TROUBLESHOOTING IRREGULAR BOWELS 1) Start with a soft & bland diet. No spicy, greasy, or fried foods.  2) Avoid gluten/wheat or dairy products from diet to see if symptoms improve. 3) Miralax  17gm or flax seed mixed in 8oz. water or juice-daily. May use 2-4 times a day as needed. 4) Gas-X, Phazyme, etc. as needed for gas & bloating.  5) Prilosec (omeprazole) over-the-counter as needed 6)  Consider probiotics (Align, Activa, etc) to help calm the bowels down  Call your doctor if you are getting worse or not getting better.  Sometimes further testing (cultures, endoscopy, X-ray studies, CT scans, bloodwork, etc.) may be needed to help diagnose and treat the cause of the diarrhea. [facility] [address], [address], [place], [place]  [address] [phone] - Main.    [phone]  - Toll Free.   [phone] - Fax [email or web address]    Diverticulosis  Diverticulosis is a condition that develops when small pouches (diverticula) form in the wall of the large intestine (colon). The colon is where water is absorbed and stool (feces) is formed. The pouches form when the inside layer of the colon pushes through weak spots in the outer layers of the colon. You may have a few pouches or many of them. The pouches usually do not cause problems unless they become inflamed or infected. When this happens, the condition is called diverticulitis. What are the causes? The cause of this condition is not known. What increases the risk? The following factors may make you more likely to develop this condition:  Being older than age 60. Your risk for this condition increases with age. Diverticulosis is rare among people younger than age 30. By age 80, many people have it.  Eating a low-fiber diet.  Having frequent constipation.  Being overweight.  Not getting enough exercise.    Smoking.  Taking over-the-counter pain medicines, like aspirin and ibuprofen.  Having a family history of diverticulosis. What are the signs or symptoms? In most people, there are no symptoms of this condition. If you do have symptoms, they may include:  Bloating.  Cramps  in the abdomen.  Constipation or diarrhea.  Pain in the lower left side of the abdomen. How is this diagnosed? Because diverticulosis usually has no symptoms, it is most often diagnosed during an exam for other colon problems. The condition may be diagnosed by:  Using a flexible scope to examine the colon (colonoscopy).  Taking an X-ray of the colon after dye has been put into the colon (barium enema).  Having a CT scan. How is this treated? You may not need treatment for this condition. Your health care provider may recommend treatment to prevent problems. You may need treatment if you have symptoms or if you previously had diverticulitis. Treatment may include:  Eating a high-fiber diet.  Taking a fiber supplement.  Taking a live bacteria supplement (probiotic).  Taking medicine to relax your colon. Follow these instructions at home: Medicines  Take over-the-counter and prescription medicines only as told by your health care provider.  If told by your health care provider, take a fiber supplement or probiotic. Constipation prevention Your condition may cause constipation. To prevent or treat constipation, you may need to:  Drink enough fluid to keep your urine pale yellow.  Take over-the-counter or prescription medicines.  Eat foods that are high in fiber, such as beans, whole grains, and fresh fruits and vegetables.  Limit foods that are high in fat and processed sugars, such as fried or sweet foods.  General instructions  Try not to strain when you have a bowel movement.  Keep all follow-up visits as told by your health care provider. This is important. Contact a health care provider if you:  Have pain in your abdomen.  Have bloating.  Have cramps.  Have not had a bowel movement in 3 days. Get help right away if:  Your pain gets worse.  Your bloating becomes very bad.  You have a fever or chills, and your symptoms suddenly get worse.  You  vomit.  You have bowel movements that are bloody or black.  You have bleeding from your rectum. Summary  Diverticulosis is a condition that develops when small pouches (diverticula) form in the wall of the large intestine (colon).  You may have a few pouches or many of them.  This condition is most often diagnosed during an exam for other colon problems.  Treatment may include increasing the fiber in your diet, taking supplements, or taking medicines. This information is not intended to replace advice given to you by your health care provider. Make sure you discuss any questions you have with your health care provider. Document Revised: 06/10/2019 Document Reviewed: 06/10/2019 [employer] Patient Education  2020 [employer]  

## 2019-12-30 NOTE — H&P (Signed)
Nathaniel [name]  DOB: 1951-11-18  Married / Language: English / Race: White  Male   `  `  Patient sent for surgical consultation at the request of Dr. Henrene Pastor with [facility]   Chief Complaint: Diverticulitis with perforation on chronic immunosuppression.  `  `  The patient is a pleasant gentleman on [name] for rheumatoid arthritis. Had a several week history of bowel changes and questionable discomfort. Usually moves his bowels twice a day. His change concerned him. Some discomfort. Went to an urgent care center while he was on vacation. Had follow-up primary care office. CT scan showed diverticulitis with significant perforation. He was admitted. He was in the hospital for almost a week and gradually improved on antibiotics. Recommendation made to consider surgical resection given the, located attack in an immunosuppressed patient. Patient is nearly finished with his 2 week oral Augmentin and about a course. He is not taking [name] nor methotrexate since 10/22. Patient comes a with his wife. He denies any abdominal surgery. He is nonsmoker is not diabetic. He can walk at least a half hour without difficulty. He had a colonoscopy about 5 years ago. Initially held off on considering surgery in 1 discussed with gastroenterology first. There is discussion about a repeat CT scan.  No personal nor family history of GI/colon cancer, inflammatory bowel disease, irritable bowel syndrome, allergy such as Celiac Sprue, dietary/dairy problems, colitis, ulcers nor gastritis. No recent sick contacts/gastroenteritis. No travel outside the country. No changes in diet. No dysphagia to solids or liquids. No significant heartburn or reflux. No hematochezia, hematemesis, coffee ground emesis. No evidence of prior gastric/peptic ulceration.  (Review of systems as stated in this history (HPI) or in the review of systems. Otherwise all other 12 point ROS are negative)  `  `  `  Allergies [name],  CMA; 10/06/2019 3:01 PM)  No Known Drug Allergies [10/06/2019]:  Allergies Reconciled  Medication History [name], CMA; 10/06/2019 3:02 PM)  Amoxicillin-Pot Clavulanate (875-125MG  Tablet, Oral) Active.  Losartan Potassium (100MG  Tablet, Oral) Active.  Folic Acid (1MG  Tablet, Oral) Active.  Medications Reconciled  Vitals [name] CMA; 10/06/2019 3:02 PM)  10/06/2019 3:02 PM  Weight: 209.6 lb Height: 67 in  Body Surface Area: 2.06 m Body Mass Index: 32.83 kg/m  Temp.: 97.8 F Pulse: 97 (Regular)  BP: 150/80 (Sitting, Left Arm, Standard)  Physical Exam Adin Hector MD; 10/06/2019 5:54 PM)  General  Mental Status - Alert.  General Appearance - Not in acute distress, Not Sickly.  Orientation - Oriented X3.  Hydration - Well hydrated.  Voice - Normal.  Integumentary  Global Assessment  Upon inspection and palpation of skin surfaces of the - Axillae: non-tender, no inflammation or ulceration, no drainage. and Distribution of scalp and body hair is normal.  General Characteristics  Temperature - normal warmth is noted.  Head and Neck  Head - normocephalic, atraumatic with no lesions or palpable masses.  Face  Global Assessment - atraumatic, no absence of expression.  Neck  Global Assessment - no abnormal movements, no bruit auscultated on the right, no bruit auscultated on the left, no decreased range of motion, non-tender.  Trachea - midline.  Thyroid  Gland Characteristics - non-tender.  Eye  Eyeball - Left - Extraocular movements intact, No Nystagmus - Left.  Eyeball - Right - Extraocular movements intact, No Nystagmus - Right.  Cornea - Left - No Hazy - Left.  Cornea - Right - No Hazy - Right.  Sclera/Conjunctiva - Left - No scleral icterus, No Discharge - Left.  Sclera/Conjunctiva - Right - No scleral icterus, No Discharge - Right.  Pupil - Left - Direct reaction to light normal.  Pupil - Right - Direct reaction to light normal.  ENMT  Ears  Pinna - Left - no  drainage observed, no generalized tenderness observed. Pinna - Right - no drainage observed, no generalized tenderness observed.  Nose and Sinuses  External Inspection of the Nose - no destructive lesion observed. Inspection of the nares - Left - quiet respiration. Inspection of the nares - Right - quiet respiration.  Mouth and Throat  Lips - Upper Lip - no fissures observed, no pallor noted. Lower Lip - no fissures observed, no pallor noted. Nasopharynx - no discharge present. Oral Cavity/Oropharynx - Tongue - no dryness observed. Oral Mucosa - no cyanosis observed. Hypopharynx - no evidence of airway distress observed.  Chest and Lung Exam  Inspection  Movements - Normal and Symmetrical. Accessory muscles - No use of accessory muscles in breathing.  Palpation  Palpation of the chest reveals - Non-tender.  Auscultation  Breath sounds - Normal and Clear.  Cardiovascular  Auscultation  Rhythm - Regular. Murmurs & Other Heart Sounds - Auscultation of the heart reveals - No Murmurs and No Systolic Clicks.  Abdomen  Inspection  Inspection of the abdomen reveals - No Visible peristalsis and No Abnormal pulsations. Umbilicus - No Bleeding, No Urine drainage.  Palpation/Percussion  Palpation and Percussion of the abdomen reveal - Soft, Non Tender, No Rebound tenderness, No Rigidity (guarding) and No Cutaneous hyperesthesia.  Note: Abdomen soft. Mildly overweight with some mild supraumbilical diastases recti. No umbilical hernia. Nontender. Not distended. No umbilical or incisional hernias. No guarding.  Male Genitourinary  Sexual Maturity  Tanner 5 - Adult hair pattern and Adult penile size and shape.  Note: No inguinal hernias. Normal external genitalia. Epididymi, testes, and spermatic cords normal without any masses.  Rectal  Note: Deferred given recent hospitalization and planned future colonoscopy  Peripheral Vascular  Upper Extremity  Inspection - Left - No Cyanotic nailbeds - Left, Not  Ischemic. Inspection - Right - No Cyanotic nailbeds - Right, Not Ischemic.  Neurologic  Neurologic evaluation reveals - normal attention span and ability to concentrate, able to name objects and repeat phrases. Appropriate fund of knowledge , normal sensation and normal coordination.  Mental Status  Affect - not angry, not paranoid.  Cranial Nerves - Normal Bilaterally.  Gait - Normal.  Neuropsychiatric  Mental status exam performed with findings of - able to articulate well with normal speech/language, rate, volume and coherence, thought content normal with ability to perform basic computations and apply abstract reasoning and no evidence of hallucinations, delusions, obsessions or homicidal/suicidal ideation.  Musculoskeletal  Global Assessment  Spine, Ribs and Pelvis - no instability, subluxation or laxity. Right Upper Extremity - no instability, subluxation or laxity.  Lymphatic  Head & Neck  General Head & Neck Lymphatics: Bilateral - Description - No Localized lymphadenopathy.  Axillary  General Axillary Region: Bilateral - Description - No Localized lymphadenopathy.  Femoral & Inguinal  Generalized Femoral & Inguinal Lymphatics: Left - Description - No Localized lymphadenopathy. Right - Description - No Localized lymphadenopathy.    Assessment & Plan   DIVERTICULITIS OF LARGE INTESTINE WITH PERFORATION WITHOUT BLEEDING (K57.20)  Impression: Episode of diverticulitis with significant perforation of gasses stabilized on antibiotics. Chronic immunosuppressed on [name] for moderately severe rheumatoid arthritis followed by Dr. Amil Amen. Last doses  09/16/2019.  Did receive steroid bolus the meantime due to delay of surgery during the Covid pandemic  Because of this complicated attack in an immunosuppressed patient, I think he would benefit from sigmoid colectomy. We'll plan robotically. Would plan at least 6 weeks from discharge.  Colonoscopy done yesterday showing some  mild narrowing and inflammation of sigmoid but no other major abnormalities.  Reassuring.  .   The anatomy & physiology of the digestive tract was discussed. The pathophysiology of the colon was discussed. Natural history risks without surgery was discussed. I feel the risks of no intervention will lead to serious problems that outweigh the operative risks; therefore, I recommended a partial colectomy to remove the pathology. Minimally invasive (Robotic/Laparoscopic) & open techniques were discussed.  Risks such as bleeding, infection, abscess, leak, reoperation, possible ostomy, hernia, heart attack, death, and other risks were discussed. I noted a good likelihood this will help address the problem. Goals of post-operative recovery were discussed as well. Need for adequate nutrition, daily bowel regimen and healthy physical activity, to optimize recovery was noted as well. We will work to minimize complications. Educational materials were available as well. Questions were answered. The patient expresses understanding & wishes to proceed with surgery.    Pt Education - CCS Colectomy post-op instructions: discussed with patient and provided information.  Adin Hector, MD, FACS, MASCRS  Gastrointestinal and Minimally Invasive Surgery  [facility]  [address], [place]  [place], [place] [address]  [phone] Main / Paging  [phone] Fax
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
## 2019-12-30 NOTE — Op Note (Signed)
12/30/2019  4:44 PM  PATIENT:  Nathaniel [name]  69 y.o. male  Patient Care Team: Jani Gravel, MD as PCP - General (Internal Medicine) Hennie Duos, MD as Consulting Physician (Rheumatology) Michael Boston, MD as Consulting Physician (General Surgery) Irene Shipper, MD as Consulting Physician (Gastroenterology)  PRE-OPERATIVE DIAGNOSIS:  RECURRENT DIVERTICULITIS  POST-OPERATIVE DIAGNOSIS:  RECURRENT DIVERTICULITIS  PROCEDURE:  XI ROBOTIC ASSISTED LOW ANTERIOR RESECTION OF RECTOSIGMOID COLON DRAINAGE OF INTRAABDOMINAL ABSCESS TAP BLOCK - BILATERAL RIGID PROCTOSCOPY  SURGEON:  Adin Hector, MD  ASSISTANT:  Annye English, MD  ANESTHESIA:     General  Nerve block provided with liposomal bupivacaine (Experel) mixed with 0.25% bupivacaine as a Bilateral TAP block x 66mL each side at the level of the transverse abdominis & preperitoneal spaces along the flank at the anterior axillary line, from subcostal ridge to iliac crest under laparoscopic guidance   Local field block at port sites & extraction wound  EBL:  Total I/O In: 2600 [I.V.:2000; IV Piggyback:600] Out: 200 [Urine:100; Blood:100]  Delay start of Pharmacological VTE agent (>24hrs) due to surgical blood loss or risk of bleeding:  no  DRAINS: No  SPECIMEN:   RECTOSIGMOID COLON (open end proximal) DISTAL ANASTOMOTIC RING (final distal margin)  DISPOSITION OF SPECIMEN:  PATHOLOGY  COUNTS:  YES  PLAN OF CARE: Admit to inpatient   PATIENT DISPOSITION:  PACU - hemodynamically stable.  INDICATION:    Patient with history of diverticulitis.  Admitted with perforated diverticulitis.  Eventually improved.  Chronically immunosuppressed.  Immunosuppression held.  Colonoscopy noted inflammation and colon stricture consistent with diverticular disease.  Cystoscopy by urology had no evidence of colovesical fistula.  I recommended segmental resection:  The anatomy & physiology of the digestive tract was discussed.  The  pathophysiology was discussed.  Natural history risks without surgery was discussed.   I worked to give an overview of the disease and the frequent need to have multispecialty involvement.  I feel the risks of no intervention will lead to serious problems that outweigh the operative risks; therefore, I recommended a partial colectomy to remove the pathology.  Laparoscopic & open techniques were discussed.   Risks such as bleeding, infection, abscess, leak, reoperation, possible ostomy, hernia, heart attack, death, and other risks were discussed.  I noted a good likelihood this will help address the problem.   Goals of post-operative recovery were discussed as well.  We will work to minimize complications.  Educational materials on the pathology had been given in the office.  Questions were answered.    The patient expressed understanding & wished to proceed with surgery.  OR FINDINGS:   Patient had very inflamed sigmoid colon densely adherent to the bladder and left pelvic sidewall.  Significant ileal inflammatory adhesion with small contained abscess between ileal mesentery and rectosigmoid mesentery  No obvious metastatic disease on visceral parietal peritoneum or liver.  The anastomosis rests 13 cm from the anal verge by rigid proctoscopy.  69 EEA stapled anastomosis  DESCRIPTION:   Informed consent was confirmed.  The patient underwent general anaesthesia without difficulty.  The patient was positioned appropriately.  VTE prevention in place.  The patient was clipped, prepped, & draped in a sterile fashion.  Surgical timeout confirmed our plan.  The patient was positioned in reverse Trendelenburg.  Abdominal entry was gained using Varess technique at the left subcostal ridge on the anterior abdominal wall.  No elevated EtCO2 noted.  Port placed.  Camera inspection revealed no injury.  Extra  ports were carefully placed under direct laparoscopic visualization.  Obvious inflamed phlegmon of  rectosigmoid colon to the left lateral and anterior pelvis and bladder wall.  Ileum corkscrewed twisted and adherent to this region as well.  I reflected the greater omentum and the upper abdomen the small bowel in the upper abdomen.  The patient was carefully positioned.  The Intuitive daVinci robot was docked with camera & instruments carefully placed.  I used robotic scissors to sharply free off the ileum adhesions to the rectosigmoid inflamed colon.  This took some time.  Encountered a small abscess pocket that was aspirated.  Eventually freed off the small bowel.  Did inspection.  Abscess cavity was on the ileal mesentery.  No kinking or stricturing of the ileal lumen.  No serosal or other abnormality.  I then focused on mobilizing the rectosigmoid colon somewhat off the bladder to untwisted.  I then was able to better elevate the rectosigmoid colon mesentery.  I scored the base of peritoneum of the medial side of the mesentery of the elevated left colon from the ligament of Treitz to the peritoneal reflection of the mid rectum.   I elevated the sigmoid mesentery and entered into the retro-mesenteric plane. We were able to identify the left ureter and gonadal vessels. We kept those posterior within the retroperitoneum and elevated the left colon mesentery off that. I did isolate the inferior mesenteric artery (IMA) pedicle but did not ligate it yet.  I continued distally and got into the avascular plane posterior to the mesorectum. This allowed me to help mobilize the rectum as well by freeing the mesorectum off the sacrum.  I mobilized the peritoneal coverings towards the peritoneal reflection on both the right and left sides of the rectum.  I stayed away from the right and left ureters.  I kept the lateral vascular pedicles to the rectum intact.   I skeletonized the lymph nodes off the inferior mesenteric artery pedicle.  I went down to its takeoff from the aorta.  I isolated the inferior mesenteric  vein off of the ligament of Treitz just cephalad to that as well.  After confirming the left ureter was out of the way, I went ahead and ligated the inferior mesenteric artery pedicle just near its takeoff from the aorta.  I did ligate the inferior mesenteric vein in a similar fashion.  We ensured hemostasis.  I fully mobilized the descending colon mesentery off the retroperitoneum and Gerota's fascia of the left kidney up towards the splenic flexure. I mobilized the left colon in a lateral to medial fashion off the line of Toldt up towards the splenic flexure.  Then focused on dissection to free the inflamed rectosigmoid colon off the left lateral and anterior pelvic wall and bladder.  Air in the side and going into the rectosigmoid mesentery little bit to avoid any ureteral or bladder injury.  Did not encounter abscesses with this dissection.  Eventually freed and mobilized the rectosigmoid colon off the bladder and pelvic sidewall.  This allowed [name] to place the rectosigmoid colon under better axial tension.   I was able to mobilize and get down to the proximal and mid rectum that was not inflamed.  Mobilized the proximal and mid rectum off the presacral space to help straighten it out.  I skeletonized at the proximal mesorectum and transected at the proximal rectum using a robotic 60 mm stapler.  90% across the first firing.  Did 1 more firing on the left lateral corner.  I chose a region at the descending/sigmoid junction that was soft and easily reached down to the rectal stump.  I transected the mesentery of the colon radially to preserve remaining colon blood supply.  I created an extraction incision through a small Pfannenstiel incision in the suprapubic region.  Placed a wound protector.  I was able to eviscerate the ileum and inspect it more carefully.  Confirmed the dense adhesion abscess wall was on the ileal mesentery.  No evidence of any obstruction or concern.  No serosal or other injury to the  small intestine.  That was returned into the peritoneal cavity.   I was able to eviscerate the rectosigmoid and descending colon out the wound.   I clamped the colon proximal to this area using a reusable pursestringer device.  Passed a 2-0 Keith needle. I transected at the descending/sigmoid junction with a scalpel. I got healthy bleeding mucosa.  We sent the rectosigmoid colon specimen off to go to pathology.  We sized the colon orifice.  I chose a 31 EEA anvil stapler system.  I reinforced the prolene pursestring with interrupted silk suture.  I placed the anvil to the open end of the proximal remaining colon and closed around it using the pursestring.    We did copious irrigation with crystalloid solution.  Hemostasis was good.  The distal end of the remaining colon easily reached down to the rectal stump, therefore, splenic flexure mobilization was not needed.      Dr Dema Severin scrubbed down and did gentle anal dilation and advanced the EEA stapler up the rectal stump. The spike was brought out at the provimal end of the rectal stump under direct visualization.  I attached the anvil of the proximal colon the spike of the stapler. Anvil was tightened down and held clamped for 60 seconds. The EEA stapler was fired and held clamped for 30 seconds. The stapler was released & removed. We noted 2 excellent anastomotic rings. Blue stitch is in the proximal ring.  Dr Dema Severin did rigid proctoscopy noted the anastomosis was at 13 cm from the anal verge consistent with the proximal rectum.  We did a final irrigation of antibiotic solution (900 mg clindamycin/240 mg gentamicin in a liter of crystalloid) & held that for the pelvic air leak test .  The rectum was insufflated the rectum while clamping the colon proximal to that anastomosis.  There was a negative air leak test. There was no tension of mesentery or bowel at the anastomosis.   Tissues looked viable.  Ureters & bowel uninjured.  The anastomosis looked healthy.   Greater omentum brought down.  It easily reached down towards the pelvis  Endoluminal gas was evacuated.  Ports & wound protector removed.  We changed gloves & redraped the patient per colon SSI prevention protocol.  We aspirated the antibiotic irrigation.  Hemostasis was good.  Sterile unused instruments were used from this point.  I closed the skin at the port sites using Monocryl stitch and sterile dressing.  I closed the extraction wound using a 0 Vicryl vertical peritoneal closure and a #1 PDS transverse anterior rectal fascial closure like a small Pfannenstiel closure. I closed the skin with Monocryl stitches.  I placed sterile dressings.     Patient is being extubated go to recovery room. I had discussed postop care with the patient in detail the office & in the holding area. Instructions are written. I discussed operative findings, updated the patient's status, discussed probable steps to recovery, and  gave postoperative recommendations to the patient's spouse, Nathaniel [name].  Recommendations were made.  Questions were answered.  She expressed understanding & appreciation.   Adin Hector, M.D., F.A.C.S. Gastrointestinal and Minimally Invasive Surgery [facility] [address], [place], [place] [address] [phone] Main / Paging

## 2019-12-30 NOTE — Anesthesia Postprocedure Evaluation (Signed)
Anesthesia Post Note  Patient: Nathaniel [name]  Procedure(s) Performed: XI ROBOTIC ASSISTED RESECTION OF RECTOSIGMOID COLON, drainage of intra-abdominal abscess, bilateral tap block (N/A Abdomen) RIGID PROCTOSCOPY (N/A )     Patient location during evaluation: PACU Anesthesia Type: General Level of consciousness: awake and alert Pain management: pain level controlled Vital Signs Assessment: post-procedure vital signs reviewed and stable Respiratory status: spontaneous breathing, nonlabored ventilation, respiratory function stable and patient connected to nasal cannula oxygen Cardiovascular status: blood pressure returned to baseline and stable Postop Assessment: no apparent nausea or vomiting Anesthetic complications: no    Last Vitals:  Vitals:   12/30/19 1745 12/30/19 1807  BP: (!) 166/78 (!) 182/86  Pulse: 80 87  Resp: 14 18  Temp: (!) 36.3 C 36.4 C  SpO2: 98% 99%    Last Pain:  Vitals:   12/30/19 1807  TempSrc: Oral  PainSc:                  Effie Berkshire

## 2019-12-30 NOTE — Transfer of Care (Signed)
Immediate Anesthesia Transfer of Care Note  Patient: Nathaniel [name]  Procedure(s) Performed: XI ROBOTIC ASSISTED RESECTION OF RECTOSIGMOID COLON, drainage of intra-abdominal abscess, bilateral tap block (N/A Abdomen) RIGID PROCTOSCOPY (N/A )  Patient Location: PACU  Anesthesia Type:General  Level of Consciousness: sedated  Airway & Oxygen Therapy: Patient Spontanous Breathing and Patient connected to face mask oxygen  Post-op Assessment: Report given to RN and Post -op Vital signs reviewed and stable  Post vital signs: Reviewed and stable  Last Vitals:  Vitals Value Taken Time  BP 144/95 12/30/19 1654  Temp    Pulse 79 12/30/19 1654  Resp 12 12/30/19 1654  SpO2 100 % 12/30/19 1654  Vitals shown include unvalidated device data.  Last Pain:  Vitals:   12/30/19 1134  TempSrc:   PainSc: 0-No pain         Complications: No apparent anesthesia complications

## 2019-12-31 ENCOUNTER — Telehealth: Payer: Self-pay

## 2019-12-31 LAB — CBC
HCT: 34.8 % — ABNORMAL LOW (ref 39.0–52.0)
Hemoglobin: 11.2 g/dL — ABNORMAL LOW (ref 13.0–17.0)
MCH: 29.1 pg (ref 26.0–34.0)
MCHC: 32.2 g/dL (ref 30.0–36.0)
MCV: 90.4 fL (ref 80.0–100.0)
Platelets: 241 10*3/uL (ref 150–400)
RBC: 3.85 MIL/uL — ABNORMAL LOW (ref 4.22–5.81)
RDW: 13.2 % (ref 11.5–15.5)
WBC: 9 10*3/uL (ref 4.0–10.5)
nRBC: 0 % (ref 0.0–0.2)

## 2019-12-31 LAB — BASIC METABOLIC PANEL
Anion gap: 8 (ref 5–15)
BUN: 11 mg/dL (ref 8–23)
CO2: 21 mmol/L — ABNORMAL LOW (ref 22–32)
Calcium: 8.3 mg/dL — ABNORMAL LOW (ref 8.9–10.3)
Chloride: 109 mmol/L (ref 98–111)
Creatinine, Ser: 1.05 mg/dL (ref 0.61–1.24)
GFR calc Af Amer: >60 mL/min (ref >60)
GFR calc non Af Amer: >60 mL/min (ref >60)
Glucose, Bld: 118 mg/dL — ABNORMAL HIGH (ref 70–99)
Potassium: 4 mmol/L (ref 3.5–5.1)
Sodium: 138 mmol/L (ref 135–145)

## 2019-12-31 LAB — MAGNESIUM: Magnesium: 1.8 mg/dL (ref 1.7–2.4)

## 2019-12-31 MED ORDER — CHLORHEXIDINE GLUCONATE CLOTH 2 % EX PADS
6.0000 | MEDICATED_PAD | Freq: Every day | CUTANEOUS | Status: DC
  Administered 2019-12-31: 6 via TOPICAL

## 2019-12-31 NOTE — Telephone Encounter (Signed)
NO ANSWER, MESSAGE LEFT FOR PATIENT. 

## 2019-12-31 NOTE — Telephone Encounter (Signed)
  Follow up Call-  Call back number 12/29/2019  Post procedure Call Back phone  # [phone]- wife's cell number  Permission to leave phone message Yes  Some recent data might be hidden     Patient questions:  Do you have a fever, pain , or abdominal swelling? Yes.   Pain Score  2 *  Have you tolerated food without any problems? Yes.    Have you been able to return to your normal activities? No.  Do you have any questions about your discharge instructions: Diet   No. Medications  No. Follow up visit  No.  Do you have questions or concerns about your Care? Patient had bowel surgery yesterday as planned, doing well. Minimal pain.   Actions: * If pain score is 4 or above: No action needed, pain <4. 1. Have you developed a fever since your procedure? no  2.   Have you had an respiratory symptoms (SOB or cough) since your procedure? no  3.   Have you tested positive for COVID 19 since your procedure no  4.   Have you had any family members/close contacts diagnosed with the COVID 19 since your procedure?  no   If yes to any of these questions please route to Joylene Friend, RN and Alphonsa Gin, Therapist, sports.

## 2019-12-31 NOTE — Progress Notes (Signed)
Nathaniel [name] SX:[id number] 05-21-1951  CARE TEAM:  PCP: Jani Gravel, MD  Outpatient Care Team: Patient Care Team: Jani Gravel, MD as PCP - General (Internal Medicine) Hennie Duos, MD as Consulting Physician (Rheumatology) Michael Boston, MD as Consulting Physician (General Surgery) Irene Shipper, MD as Consulting Physician (Gastroenterology) Ceasar Mons, MD as Consulting Physician (Urology)  Inpatient Treatment Team: Treatment Team: Attending Provider: Michael Boston, MD; Registered Nurse: Donia Ast, RN; Utilization Review: Tressie Stalker, RN   Problem List:   Principal Problem:   Recurrent diverticulitis s/p robotic rectosigmoid resection 12/30/2019 Active Problems:   Rheumatoid arthritis ([place])   Immunosuppression due to drug therapy   Overweight   GERD (gastroesophageal reflux disease)   OSA on CPAP   Panic attacks   Essential hypertension   1 Day Post-Op  12/30/2019  POST-OPERATIVE DIAGNOSIS:  RECURRENT DIVERTICULITIS  PROCEDURE:  XI ROBOTIC ASSISTED LOW ANTERIOR RESECTION OF RECTOSIGMOID COLON DRAINAGE OF INTRAABDOMINAL ABSCESS TAP BLOCK - BILATERAL RIGID PROCTOSCOPY  SURGEON:  Adin Hector, MD  OR FINDINGS:   Patient had very inflamed sigmoid colon densely adherent to the bladder and left pelvic sidewall.  Significant ileal inflammatory adhesion with small contained abscess between ileal mesentery and rectosigmoid mesentery  No obvious metastatic disease on visceral parietal peritoneum or liver.  The anastomosis rests 13 cm from the anal verge by rigid proctoscopy.  82 EEA stapled anastomosis   Assessment  Recovering well so far  [facility] Stay = 1 days)  Plan:  -ERAS protocol -Follow-up on pathology -Stop IV fluids.  As needed boluses as backup -Remove Foley catheter on postoperative day 2 per protocol -Hypertension controlled -GERD.  Mylanta as needed for now -VTE prophylaxis- SCDs, etc -mobilize as tolerated to  help recovery  30 minutes spent in review, evaluation, examination, counseling, and coordination of care.  More than 50% of that time was spent in counseling.  I updated the patient's status to the patient and spouse. (she was on speakerphone) Recommendations were made.  Questions were answered.  They expressed understanding & appreciation.   12/31/2019    Subjective: (Chief complaint)  Mild soreness last night but feeling better.  Walked in hallways.  Tolerating liquids.  Had little blood in a loose bowel movement.  Passing flatus.  Again is very appreciative of nursing and surgical care.  Objective:  Vital signs:  Vitals:   12/30/19 2105 12/30/19 2125 12/31/19 0150 12/31/19 0615  BP: (!) 144/76  120/65   Pulse: 84 86 76   Resp: 17 18 16    Temp: 98.2 F (36.8 C)  98.8 F (37.1 C)   TempSrc: Oral  Oral   SpO2: 98% 95% 96%   Weight:    99.7 kg  Height:        Last BM Date: 12/29/19  Intake/Output   Yesterday:  02/04 0701 - 02/05 0700 In: 3500 [P.O.:100; I.V.:2800; IV Piggyback:600] Out: 1100 [Urine:1000; Blood:100] This shift:  No intake/output data recorded.  Bowel function:  Flatus: YES  BM:  YES  Drain: (No drain)   Physical Exam:  General: Pt awake/alert in no acute distress Eyes: PERRL, normal EOM.  Sclera clear.  No icterus Neuro: CN II-XII intact w/o focal sensory/motor deficits. Lymph: No head/neck/groin lymphadenopathy Psych:  No delerium/psychosis/paranoia.  Oriented x 4 HENT: Normocephalic, Mucus membranes moist.  No thrush Neck: Supple, No tracheal deviation.  No obvious thyromegaly Chest: No pain to chest wall compression.  Good respiratory excursion.  No audible wheezing CV:  Pulses intact.  Regular rhythm.  No major extremity edema MS: Normal AROM mjr joints.  No obvious deformity  Abdomen: Soft.  Nondistended.  Mildly tender at incisions only.  No evidence of peritonitis.  No incarcerated hernias.  Ext:  No deformity.  No mjr edema.   No cyanosis Skin: No petechiae / purpurea.  No major sores.  Warm and dry    Results:   Cultures: Recent Results (from the past 720 hour(s))  SARS CORONAVIRUS 2 (TAT 6-24 HRS) Nasopharyngeal Nasopharyngeal Swab     Status: None   Collection Time: 12/27/19  9:08 AM   Specimen: Nasopharyngeal Swab  Result Value Ref Range Status   SARS Coronavirus 2 NEGATIVE NEGATIVE Final    Comment: (NOTE) SARS-CoV-2 target nucleic acids are NOT DETECTED. The SARS-CoV-2 RNA is generally detectable in upper and lower respiratory specimens during the acute phase of infection. Negative results do not preclude SARS-CoV-2 infection, do not rule out co-infections with other pathogens, and should not be used as the sole basis for treatment or other patient management decisions. Negative results must be combined with clinical observations, patient history, and epidemiological information. The expected result is Negative. Fact Sheet for Patients: SugarRoll.be Fact Sheet for Healthcare Providers: https://www.woods-mathews.com/ This test is not yet approved or cleared by the [place] FDA and  has been authorized for detection and/or diagnosis of SARS-CoV-2 by FDA under an Emergency Use Authorization (EUA). This EUA will remain  in effect (meaning this test can be used) for the duration of the COVID-19 declaration under Section 56 4(b)(1) of the Act, 21 U.S.C. section 360bbb-3(b)(1), unless the authorization is terminated or revoked sooner. Performed at [facility], [place] [address], [place], [place] [address]     Labs: Results for orders placed or performed during the hospital encounter of 12/30/19 (from the past 48 hour(s))  ABO/Rh     Status: None   Collection Time: 12/30/19 11:02 AM  Result Value Ref Range   ABO/RH(D)      O POS Performed at [facility], [place] [address], [place], [place] [address]   Type and screen [place]     Status: None   Collection Time: 12/30/19 11:50 AM  Result Value Ref Range   ABO/RH(D) O POS    Antibody Screen NEG    Sample Expiration      01/02/2020,2359 Performed at [facility], [place] [address] [place], [place] [id number]   Basic metabolic panel     Status: Abnormal   Collection Time: 12/31/19  4:44 AM  Result Value Ref Range   Sodium 138 135 - 145 mmol/L   Potassium 4.0 3.5 - 5.1 mmol/L   Chloride 109 98 - 111 mmol/L   CO2 21 (L) 22 - 32 mmol/L   Glucose, Bld 118 (H) 70 - 99 mg/dL   BUN 11 8 - 23 mg/dL   Creatinine, Ser 1.05 0.61 - 1.24 mg/dL   Calcium 8.3 (L) 8.9 - 10.3 mg/dL   GFR calc non Af Amer >60 >60 mL/min   GFR calc Af Amer >60 >60 mL/min   Anion gap 8 5 - 15    Comment: Performed at [facility], [place] [address], [place], [place] [address]  CBC     Status: Abnormal   Collection Time: 12/31/19  4:44 AM  Result Value Ref Range   WBC 9.0 4.0 - 10.5 K/uL   RBC 3.85 (L) 4.22 - 5.81 MIL/uL  Hemoglobin 11.2 (L) 13.0 - 17.0 g/dL   HCT 34.8 (L) 39.0 - 52.0 %   MCV 90.4 80.0 - 100.0 fL   MCH 29.1 26.0 - 34.0 pg   MCHC 32.2 30.0 - 36.0 g/dL   RDW 13.2 11.5 - 15.5 %   Platelets 241 150 - 400 K/uL   nRBC 0.0 0.0 - 0.2 %    Comment: Performed at [facility], [place] [address], [place], [place] [address]  Magnesium     Status: None   Collection Time: 12/31/19  4:44 AM  Result Value Ref Range   Magnesium 1.8 1.7 - 2.4 mg/dL    Comment: Performed at [facility], [place] [address] [place], [place] [address]    Imaging / Studies: No results found.  Medications / Allergies: per chart  Antibiotics: Anti-infectives (From admission, onward)   Start     Dose/Rate Route Frequency Ordered Stop   12/30/19 2200  cefoTEtan (CEFOTAN) 2 g in sodium chloride 0.9 % 100 mL IVPB     2 g 200 mL/hr over 30 Minutes Intravenous Every 12 hours 12/30/19 1813 12/30/19 2322   12/30/19  1606  clindamycin (CLEOCIN) 900 mg, gentamicin (GARAMYCIN) 240 mg in sodium chloride 0.9 % 1,000 mL for intraperitoneal lavage  Status:  Discontinued       As needed 12/30/19 1606 12/30/19 1809   12/30/19 1400  neomycin (MYCIFRADIN) tablet 1,000 mg  Status:  Discontinued     1,000 mg Oral 3 times per day 12/30/19 1102 12/30/19 1809   12/30/19 1400  metroNIDAZOLE (FLAGYL) tablet 1,000 mg  Status:  Discontinued     1,000 mg Oral 3 times per day 12/30/19 1102 12/30/19 1809   12/30/19 1115  cefoTEtan (CEFOTAN) 2 g in sodium chloride 0.9 % 100 mL IVPB     2 g 200 mL/hr over 30 Minutes Intravenous On call to O.R. 12/30/19 1102 12/30/19 2323   12/30/19 0600  clindamycin (CLEOCIN) 900 mg, gentamicin (GARAMYCIN) 240 mg in sodium chloride 0.9 % 1,000 mL for intraperitoneal lavage  Status:  Discontinued      Irrigation On call to O.R. 12/29/19 0843 12/30/19 1809        Note: Portions of this report may have been transcribed using voice recognition software. Every effort was made to ensure accuracy; however, inadvertent computerized transcription errors may be present.   Any transcriptional errors that result from this process are unintentional.     Adin Hector, MD, FACS, MASCRS Gastrointestinal and Minimally Invasive Surgery    [address] [place], [place] [address] [phone] Main / Paging [phone] Fax Please see Amion for pager number, especial 5pm - 7am.

## 2020-01-01 NOTE — Discharge Summary (Signed)
Physician Discharge Summary  Patient ID: Nathaniel [name] MRN: [id number] DOB/AGE: [birth year] 69 y.o.  Admit date: 12/30/2019 Discharge date: 01/01/2020  Admission Diagnoses:Principal Problem:   Recurrent diverticulitis s/p robotic rectosigmoid resection 12/30/2019 Active Problems:   Overweight   Rheumatoid arthritis (HCC)   GERD (gastroesophageal reflux disease)   OSA on CPAP   Panic attacks   Essential hypertension   Immunosuppression due to drug therapy  Discharge Diagnoses:  Principal Problem:   Recurrent diverticulitis s/p robotic rectosigmoid resection 12/30/2019 Active Problems:   Overweight   Rheumatoid arthritis (HCC)   GERD (gastroesophageal reflux disease)   OSA on CPAP   Panic attacks   Essential hypertension   Immunosuppression due to drug therapy   Discharged Condition: good  Hospital Course: Patient did well after robotic assisted low anterior resection for diverticulitis.  By postoperative day 2 his bowel function had returned, he was tolerating regular diet, he had no nausea or vomiting and had very good pain control.  His wounds were intact.  He had some bruising around the lower incision but no signs of infection.  His abdomen was soft on examination.  His vitals were stable lab work was stable.  He was discharged home on postoperative day 2  Consults: None  Significant Diagnostic Studies: labs:  CBC    Component Value Date/Time   WBC 9.0 12/31/2019 0444   RBC 3.85 (L) 12/31/2019 0444   HGB 11.2 (L) 12/31/2019 0444   HCT 34.8 (L) 12/31/2019 0444   PLT 241 12/31/2019 0444   MCV 90.4 12/31/2019 0444   MCH 29.1 12/31/2019 0444   MCHC 32.2 12/31/2019 0444   RDW 13.2 12/31/2019 0444   LYMPHSABS 2.4 09/14/2019 0541   MONOABS 1.0 09/14/2019 0541   EOSABS 0.1 09/14/2019 0541   BASOSABS 0.0 09/14/2019 0541    Treatments: surgery: Robotic assisted low anterior resection  Discharge Exam: Blood pressure (!) 163/76, pulse 85, temperature 98.2 F (36.8 C),  temperature source Oral, resp. rate 18, height 5\' 7"  (1.702 m), weight 99.7 kg, SpO2 96 %. General appearance: alert and cooperative Resp: clear to auscultation bilaterally Cardio: regular rate and rhythm, S1, S2 normal, no murmur, click, rub or gallop Incision/Wound: Dressings removed.  Port sites clean dry and intact.  Lower abdominal incision clean dry intact with some bruising noted.  Disposition: Discharge disposition: 01-Home or Self Care       Discharge Instructions    Call MD for:   Complete by: As directed    FEVER > 101.5 F  (temperatures < 101.5 F are not significant)   Call MD for:  extreme fatigue   Complete by: As directed    Call MD for:  persistant dizziness or light-headedness   Complete by: As directed    Call MD for:  persistant nausea and vomiting   Complete by: As directed    Call MD for:  redness, tenderness, or signs of infection (pain, swelling, redness, odor or green/yellow discharge around incision site)   Complete by: As directed    Call MD for:  severe uncontrolled pain   Complete by: As directed    Diet - low sodium heart healthy   Complete by: As directed    Start with a bland diet such as soups, liquids, starchy foods, low fat foods, etc. the first few days at home. Gradually advance to a solid, low-fat, high fiber diet by the end of the first week at home.   Add a fiber supplement to your diet (Metamucil, etc)  If you feel full, bloated, or constipated, stay on a full liquid or pureed/blenderized diet for a few days until you feel better and are no longer constipated.   Diet - low sodium heart healthy   Complete by: As directed    Discharge instructions   Complete by: As directed    See Discharge Instructions If you are not getting better after two weeks or are noticing you are getting worse, contact our office [phone] for further advice.  We may need to adjust your medications, re-evaluate you in the office, send you to the emergency room,  or see what other things we can do to help. The clinic staff is available to answer your questions during regular business hours (8:30am-5pm).  Please don't hesitate to call and ask to speak to one of our nurses for clinical concerns.    A surgeon from [facility] is always on call at the hospitals 24 hours/day If you have a medical emergency, go to the nearest emergency room or call 911.   Discharge wound care:   Complete by: As directed    It is good for closed incisions and even open wounds to be washed every day.  Shower every day.  Short baths are fine.  Wash the incisions and wounds clean with soap & water.    You may leave closed incisions open to air if it is dry.   You may cover the incision with clean gauze & replace it after your daily shower for comfort.   Driving Restrictions   Complete by: As directed    You may drive when: - you are no longer taking narcotic prescription pain medication - you can comfortably wear a seatbelt - you can safely make sudden turns/stops without pain.   Increase activity slowly   Complete by: As directed    Start light daily activities --- self-care, walking, climbing stairs- beginning the day after surgery.  Gradually increase activities as tolerated.  Control your pain to be active.  Stop when you are tired.  Ideally, walk several times a day, eventually an hour a day.   Most people are back to most day-to-day activities in a few weeks.  It takes 4-6 weeks to get back to unrestricted, intense activity. If you can walk 30 minutes without difficulty, it is safe to try more intense activity such as jogging, treadmill, bicycling, low-impact aerobics, swimming, etc. Save the most intensive and strenuous activity for last (Usually 4-8 weeks after surgery) such as sit-ups, heavy lifting, contact sports, etc.  Refrain from any intense heavy lifting or straining until you are off narcotics for pain control.  You will have off days, but things should  improve week-by-week. DO NOT PUSH THROUGH PAIN.  Let pain be your guide: If it hurts to do something, don't do it.   Increase activity slowly   Complete by: As directed    Lifting restrictions   Complete by: As directed    If you can walk 30 minutes without difficulty, it is safe to try more intense activity such as jogging, treadmill, bicycling, low-impact aerobics, swimming, etc. Save the most intensive and strenuous activity for last (Usually 4-8 weeks after surgery) such as sit-ups, heavy lifting, contact sports, etc.   Refrain from any intense heavy lifting or straining until you are off narcotics for pain control.  You will have off days, but things should improve week-by-week. DO NOT PUSH THROUGH PAIN.  Let pain be your guide: If it hurts  to do something, don't do it.  Pain is your body warning you to avoid that activity for another week until the pain goes down.   May shower / Bathe   Complete by: As directed    May walk up steps   Complete by: As directed    Remove dressing in 72 hours   Complete by: As directed    Make sure all dressings are removed by the third day after surgery.  Leave incisions open to air.  OK to cover incisions with gauze or bandages as desired   Sexual Activity Restrictions   Complete by: As directed    You may have sexual intercourse when it is comfortable. If it hurts to do something, stop.     Allergies as of 01/01/2020   No Known Allergies     Medication List    TAKE these medications   acetaminophen 500 MG tablet Commonly known as: TYLENOL Take 1,000 mg by mouth every 6 (six) hours as needed for moderate pain.   cholecalciferol 1000 units tablet Commonly known as: VITAMIN D Take 1,000 Units by mouth daily after breakfast.   Co Q-10 100 MG Caps Take 100 mg by mouth daily.   folic acid 1 MG tablet Commonly known as: FOLVITE Take 1 mg by mouth daily after breakfast.   [name] PEN [place] Inject into the skin. Per Dr. Michaell Cowing, pt is off [name] 3  weeks prior to surgery   losartan 25 MG tablet Commonly known as: COZAAR Take 12.5 mg by mouth daily after breakfast.   METHOTREXATE PO Take by mouth. Pt is currently off since October   traMADol 50 MG tablet Commonly known as: ULTRAM Take 1-2 tablets (50-100 mg total) by mouth every 6 (six) hours as needed for moderate pain or severe pain.   vitamin E 180 MG (400 UNITS) capsule Take 400 Units by mouth daily after breakfast.   Voltaren 1 % Gel Generic drug: diclofenac Sodium Apply 2 g topically 3 (three) times daily as needed (arthritis).            Discharge Care Instructions  (From admission, onward)         Start     Ordered   12/30/19 0000  Discharge wound care:    Comments: It is good for closed incisions and even open wounds to be washed every day.  Shower every day.  Short baths are fine.  Wash the incisions and wounds clean with soap & water.    You may leave closed incisions open to air if it is dry.   You may cover the incision with clean gauze & replace it after your daily shower for comfort.   12/30/19 1148         Follow-up Information    Karie Soda, MD. Schedule an appointment as soon as possible for a visit in 3 weeks.   Specialty: General Surgery Why: To follow up after your operation, To follow up after your hospital stay Contact information: [address] [place] [address] [phone]           Signed: Clovis Pu Maxi Carreras 01/01/2020, 10:19 AM

## 2020-01-01 NOTE — Progress Notes (Signed)
Pharmacy Brief Note - Alvimopan (Entereg)  The standing order set for alvimopan (Entereg) now includes an automatic order to discontinue the drug after the patient has had a bowel movement. The change was approved by the [place] and the Medical Executive Committee.   This patient has had bowel movements documented by nursing. Therefore, alvimopan has been discontinued. If there are questions, please contact the pharmacy at [phone].   Thank you- Dorrene German 01/01/2020 1:10 AM

## 2020-01-03 LAB — SURGICAL PATHOLOGY

## 2020-11-09 DIAGNOSIS — H25813 Combined forms of age-related cataract, bilateral: Secondary | ICD-10-CM | POA: Diagnosis not present

## 2020-11-09 DIAGNOSIS — H524 Presbyopia: Secondary | ICD-10-CM | POA: Diagnosis not present

## 2021-01-01 DIAGNOSIS — E669 Obesity, unspecified: Secondary | ICD-10-CM | POA: Diagnosis not present

## 2021-01-01 DIAGNOSIS — M0579 Rheumatoid arthritis with rheumatoid factor of multiple sites without organ or systems involvement: Secondary | ICD-10-CM | POA: Diagnosis not present

## 2021-01-01 DIAGNOSIS — M542 Cervicalgia: Secondary | ICD-10-CM | POA: Diagnosis not present

## 2021-01-01 DIAGNOSIS — M2550 Pain in unspecified joint: Secondary | ICD-10-CM | POA: Diagnosis not present

## 2021-01-01 DIAGNOSIS — Z6832 Body mass index (BMI) 32.0-32.9, adult: Secondary | ICD-10-CM | POA: Diagnosis not present

## 2021-01-08 DIAGNOSIS — Z85820 Personal history of malignant melanoma of skin: Secondary | ICD-10-CM | POA: Diagnosis not present

## 2021-01-08 DIAGNOSIS — L819 Disorder of pigmentation, unspecified: Secondary | ICD-10-CM | POA: Diagnosis not present

## 2021-01-08 DIAGNOSIS — L905 Scar conditions and fibrosis of skin: Secondary | ICD-10-CM | POA: Diagnosis not present

## 2021-01-08 DIAGNOSIS — C4442 Squamous cell carcinoma of skin of scalp and neck: Secondary | ICD-10-CM | POA: Diagnosis not present

## 2021-01-08 DIAGNOSIS — D044 Carcinoma in situ of skin of scalp and neck: Secondary | ICD-10-CM | POA: Diagnosis not present

## 2021-01-08 DIAGNOSIS — B351 Tinea unguium: Secondary | ICD-10-CM | POA: Diagnosis not present

## 2021-01-08 DIAGNOSIS — L570 Actinic keratosis: Secondary | ICD-10-CM | POA: Diagnosis not present

## 2021-01-08 DIAGNOSIS — Z85828 Personal history of other malignant neoplasm of skin: Secondary | ICD-10-CM | POA: Diagnosis not present

## 2021-01-08 DIAGNOSIS — D485 Neoplasm of uncertain behavior of skin: Secondary | ICD-10-CM | POA: Diagnosis not present

## 2021-01-08 DIAGNOSIS — D1801 Hemangioma of skin and subcutaneous tissue: Secondary | ICD-10-CM | POA: Diagnosis not present

## 2021-03-20 DIAGNOSIS — Z0000 Encounter for general adult medical examination without abnormal findings: Secondary | ICD-10-CM | POA: Diagnosis not present

## 2021-03-20 DIAGNOSIS — K7689 Other specified diseases of liver: Secondary | ICD-10-CM | POA: Diagnosis not present

## 2021-03-29 DIAGNOSIS — H6123 Impacted cerumen, bilateral: Secondary | ICD-10-CM | POA: Diagnosis not present

## 2021-03-29 DIAGNOSIS — M0589 Other rheumatoid arthritis with rheumatoid factor of multiple sites: Secondary | ICD-10-CM | POA: Diagnosis not present

## 2021-03-29 DIAGNOSIS — Z0000 Encounter for general adult medical examination without abnormal findings: Secondary | ICD-10-CM | POA: Diagnosis not present

## 2021-03-29 DIAGNOSIS — E559 Vitamin D deficiency, unspecified: Secondary | ICD-10-CM | POA: Diagnosis not present

## 2021-03-29 DIAGNOSIS — Z125 Encounter for screening for malignant neoplasm of prostate: Secondary | ICD-10-CM | POA: Diagnosis not present

## 2021-03-29 DIAGNOSIS — Z8249 Family history of ischemic heart disease and other diseases of the circulatory system: Secondary | ICD-10-CM | POA: Diagnosis not present

## 2021-03-29 DIAGNOSIS — I10 Essential (primary) hypertension: Secondary | ICD-10-CM | POA: Diagnosis not present

## 2021-04-02 ENCOUNTER — Other Ambulatory Visit: Payer: Self-pay | Admitting: Internal Medicine

## 2021-04-02 DIAGNOSIS — Z8249 Family history of ischemic heart disease and other diseases of the circulatory system: Secondary | ICD-10-CM

## 2021-04-02 DIAGNOSIS — I10 Essential (primary) hypertension: Secondary | ICD-10-CM

## 2021-04-04 ENCOUNTER — Other Ambulatory Visit: Payer: Self-pay | Admitting: Internal Medicine

## 2021-04-04 DIAGNOSIS — Z8249 Family history of ischemic heart disease and other diseases of the circulatory system: Secondary | ICD-10-CM

## 2021-04-05 DIAGNOSIS — M0579 Rheumatoid arthritis with rheumatoid factor of multiple sites without organ or systems involvement: Secondary | ICD-10-CM | POA: Diagnosis not present

## 2021-04-05 DIAGNOSIS — Z6832 Body mass index (BMI) 32.0-32.9, adult: Secondary | ICD-10-CM | POA: Diagnosis not present

## 2021-04-05 DIAGNOSIS — M542 Cervicalgia: Secondary | ICD-10-CM | POA: Diagnosis not present

## 2021-04-05 DIAGNOSIS — M65342 Trigger finger, left ring finger: Secondary | ICD-10-CM | POA: Diagnosis not present

## 2021-04-05 DIAGNOSIS — E669 Obesity, unspecified: Secondary | ICD-10-CM | POA: Diagnosis not present

## 2021-04-05 DIAGNOSIS — M2550 Pain in unspecified joint: Secondary | ICD-10-CM | POA: Diagnosis not present

## 2021-05-08 ENCOUNTER — Ambulatory Visit
Admission: RE | Admit: 2021-05-08 | Discharge: 2021-05-08 | Disposition: A | Discharge status: Home / Self Care | Payer: No Typology Code available for payment source | Source: Ambulatory Visit | Attending: Internal Medicine | Admitting: Internal Medicine

## 2021-05-08 DIAGNOSIS — I10 Essential (primary) hypertension: Secondary | ICD-10-CM

## 2021-05-08 DIAGNOSIS — Z8249 Family history of ischemic heart disease and other diseases of the circulatory system: Secondary | ICD-10-CM

## 2021-05-11 ENCOUNTER — Ambulatory Visit
Admission: RE | Admit: 2021-05-11 | Discharge: 2021-05-11 | Disposition: A | Discharge status: Home / Self Care | Payer: Medicare PPO | Source: Ambulatory Visit | Attending: Internal Medicine | Admitting: Internal Medicine

## 2021-05-11 DIAGNOSIS — Z8249 Family history of ischemic heart disease and other diseases of the circulatory system: Secondary | ICD-10-CM

## 2021-11-06 IMAGING — CT CT CARDIAC CORONARY ARTERY CALCIUM SCORE
3 series · 14 of 20 positions shown, 16 images · non-contrast
Comparison: 03/17/2017 chest radiograph.  Chest CT 03/02/2014.

CLINICAL DATA: Hyperlipidemia.  Hypertension.

EXAM:
CT CARDIAC CORONARY ARTERY CALCIUM SCORE
TECHNIQUE: Non-contrast imaging through the heart was performed using
prospective ECG gating. Image post processing was performed on an
independent workstation, allowing for quantitative analysis of the
heart and coronary arteries. Note that this exam targets the heart
and the chest was not imaged in its entirety.

[Series 2: calcium scoring 2.00 qr36 bestdiast 70% hrt calciu · axial · 0.50mm/px · z∈[+1618,+1690]mm · 4 of 60 slices shown]
[im 12/60  vessel]
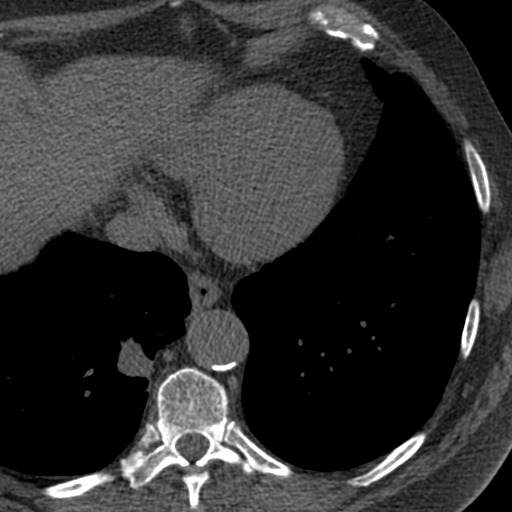
[im 24/60  vessel]
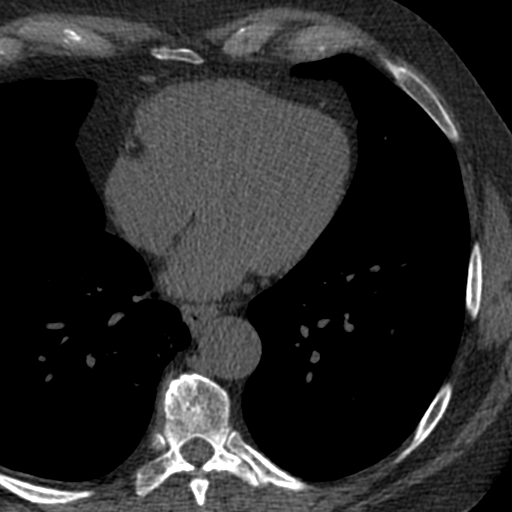
[im 36/60  vessel]
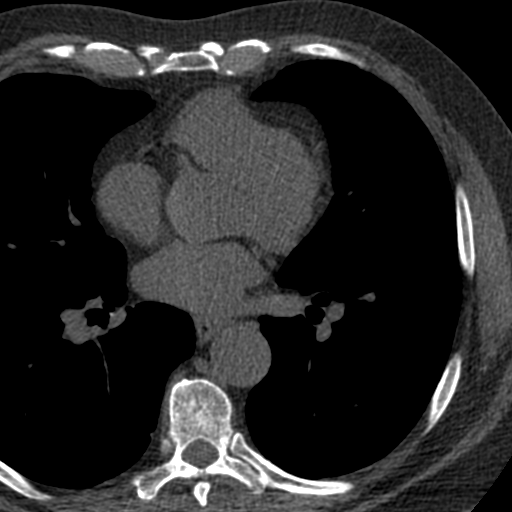
[im 48/60  vessel]
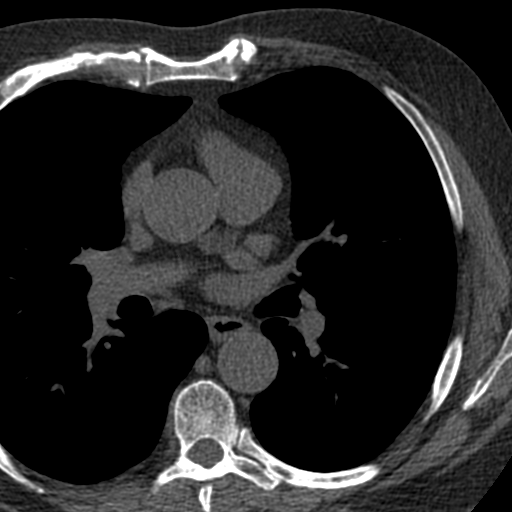

[Series 3: calcium scoring 2.00 br40 bestdiast 70% axial · axial · 0.63mm/px · z∈[+1614,+1694]mm · 5 of 60 slices shown, 7 images]
[im 10/60  vessel]
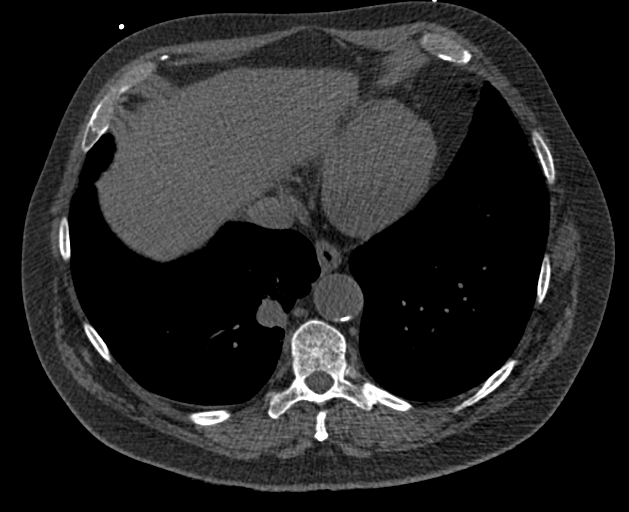
[im 10/60  lung]
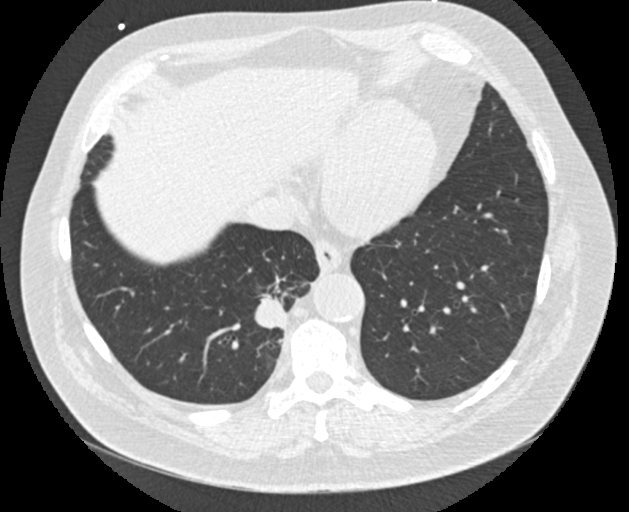
[im 20/60  vessel]
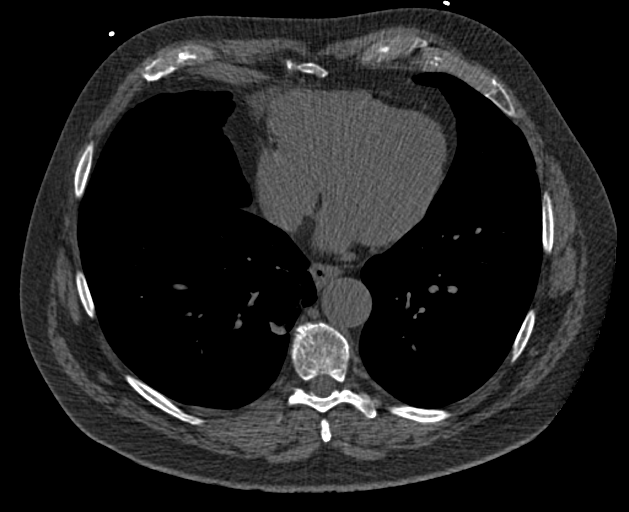
[im 30/60  vessel]
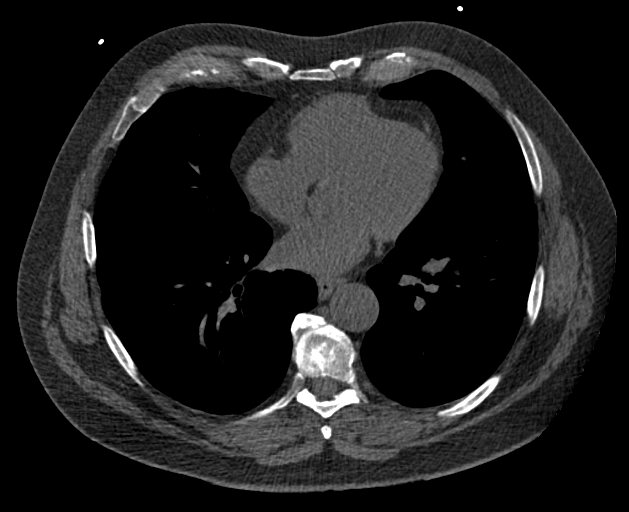
[im 40/60  vessel]
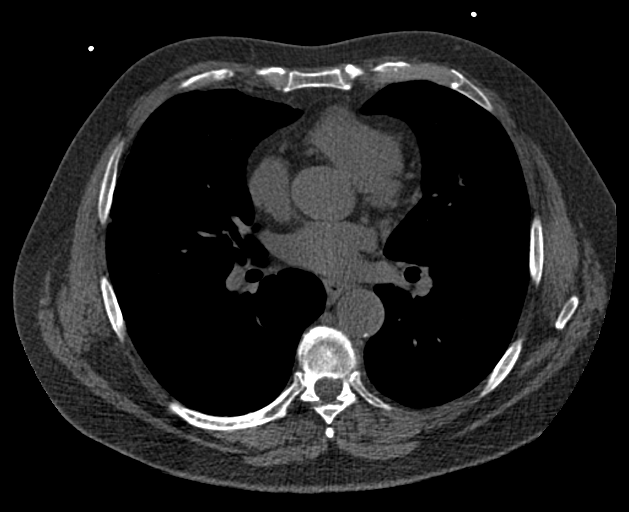
[im 50/60  vessel]
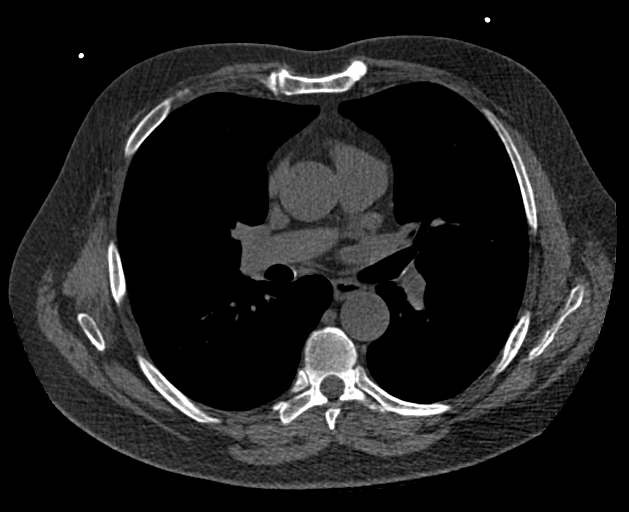
[im 50/60  lung]
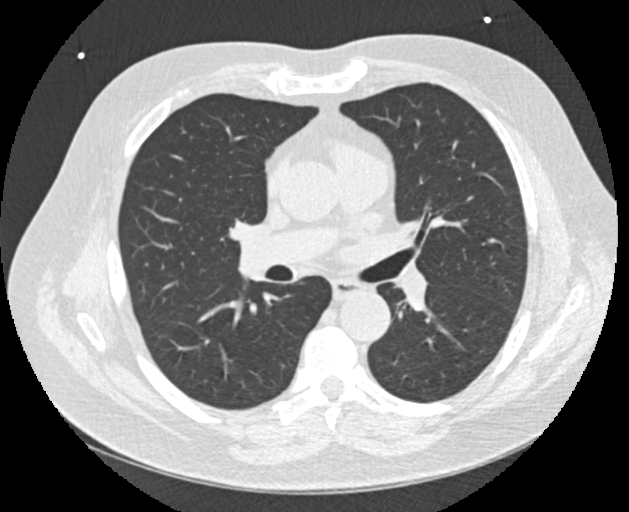

[Series 9: calcium scoring 2.00 br60 bestdiast 70% lungs · axial · 0.63mm/px · z∈[+1614,+1694]mm · 5 of 60 slices shown]
[im 10/60  vessel]
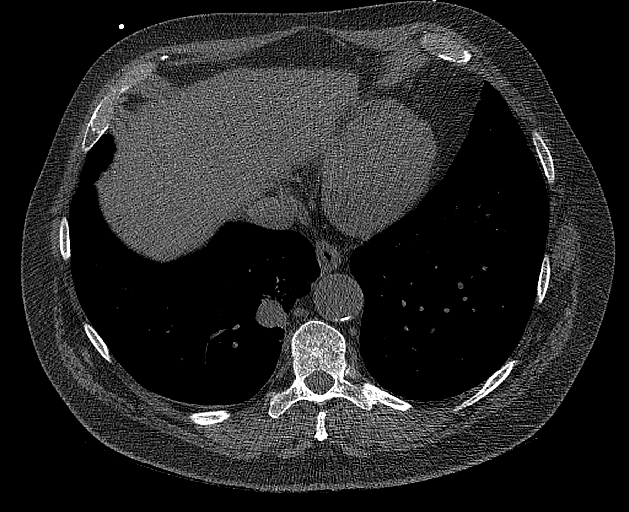
[im 20/60  vessel]
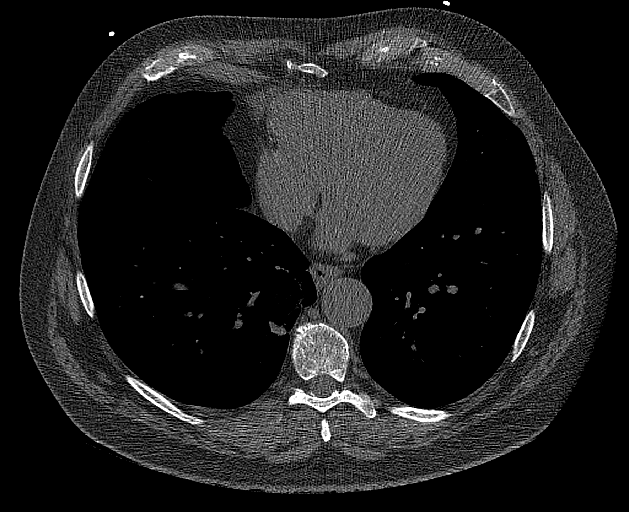
[im 30/60  vessel]
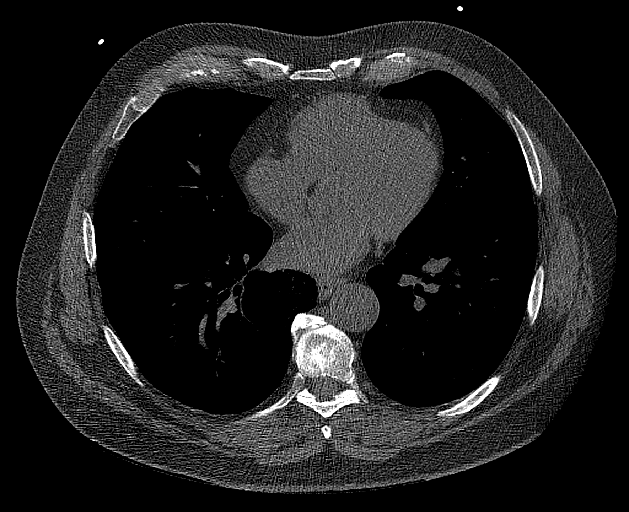
[im 40/60  vessel]
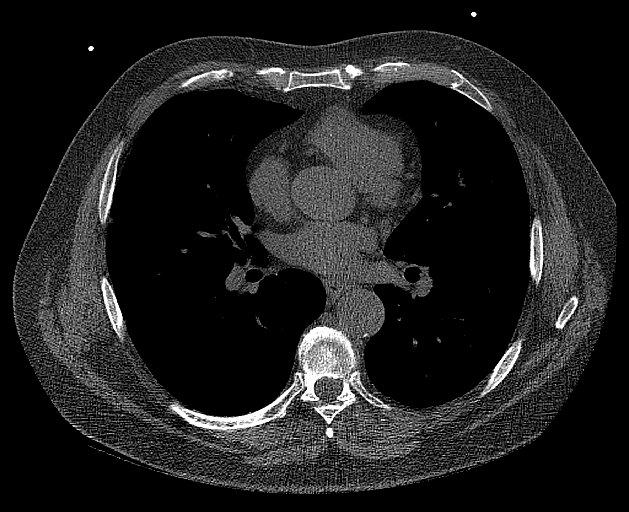
[im 50/60  vessel]
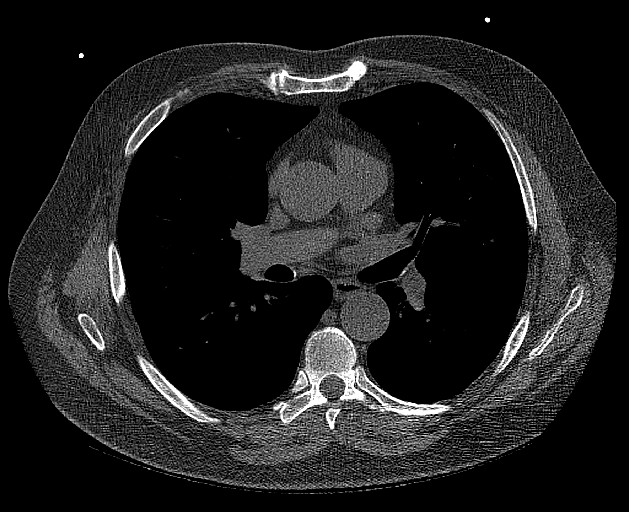

[14 of 20 positions shown; findings below may reference images not displayed]

FINDINGS: CORONARY CALCIUM SCORES:

Left Main: 0

LAD:

LCx: 0

RCA: 0

Total Agatston Score:

[HOSPITAL] percentile: 20th

AORTA MEASUREMENTS:

Ascending Aorta: 35 mm

Descending Aorta: 31 mm

OTHER FINDINGS:

Cardiovascular: Aortic atherosclerosis. Normal heart size, without
pericardial effusion.

Mediastinum/Nodes: No imaged thoracic adenopathy.

Lungs/Pleura: No pleural fluid. Presumed secretions in the dependent
right mainstem bronchus.

Subpleural 3 mm right middle lobe pulmonary nodule on [DATE] is
similar to the prior exam and considered benign, likely a subpleural
lymph node.

The previously described right lower lobe intralobar pulmonary
sequestration is similar, including at maximally 2.9 cm on 57/9.
This is incompletely imaged.

Upper Abdomen: High left hepatic lobe subcentimeter low-density
lesion is consistent with a cyst and was similar on the prior.

Musculoskeletal: No acute osseous abnormality.
IMPRESSION: 1. Total Agatston score of 8.1, corresponding to 20th percentile for
age, sex, and race based cohort.
2. Right lower lobe intralobar pulmonary sequestration, similar to
3345 and incompletely imaged.
3. Aortic Atherosclerosis ([id number]).

## 2021-11-09 IMAGING — US US ABDOMINAL AORTA SCREENING AAA
1 series · 14 of 15 positions shown · non-contrast
Comparison: CT abdomen/pelvis dated 11/22/2019

CLINICAL DATA: 70-year-old male with a family history of AAA

EXAM:
US ABDOMINAL AORTA MEDICARE SCREENING
TECHNIQUE: Ultrasound examination of the abdominal aorta was performed as a
screening evaluation for abdominal aortic aneurysm.

[Series 1: us abdominal aorta screening aaa · 0.26mm/px · 14 of 15 slices shown]
[im 1/15]
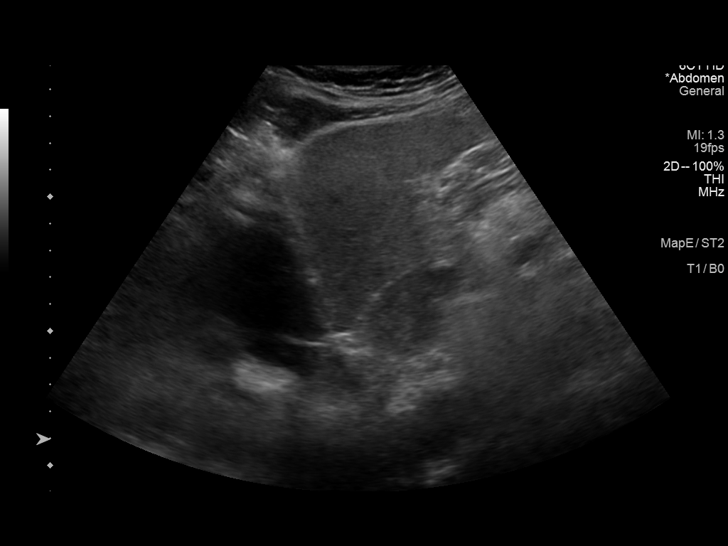
[im 2/15]
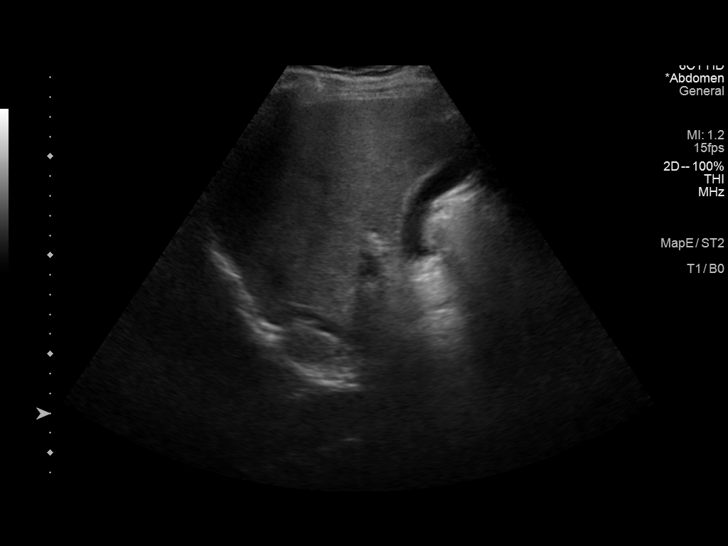
[im 3/15]
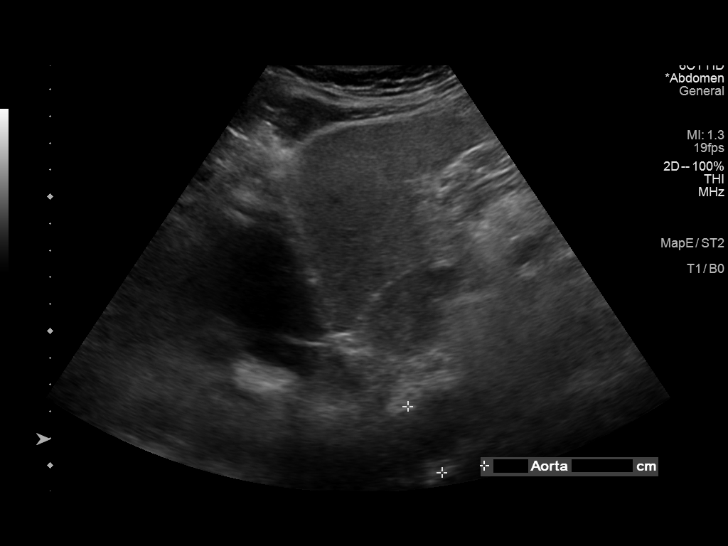
[im 4/15]
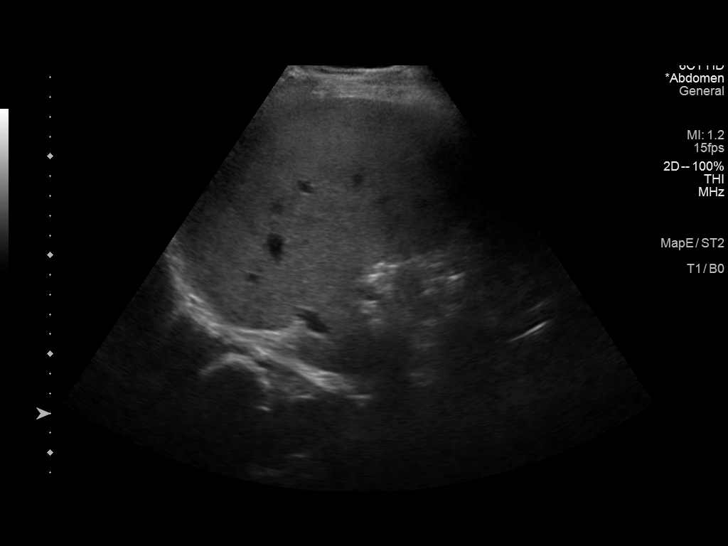
[im 5/15]
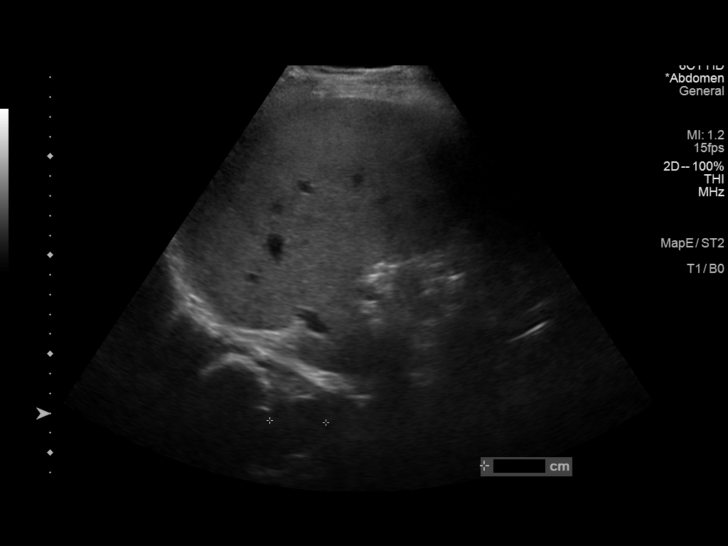
[im 6/15]
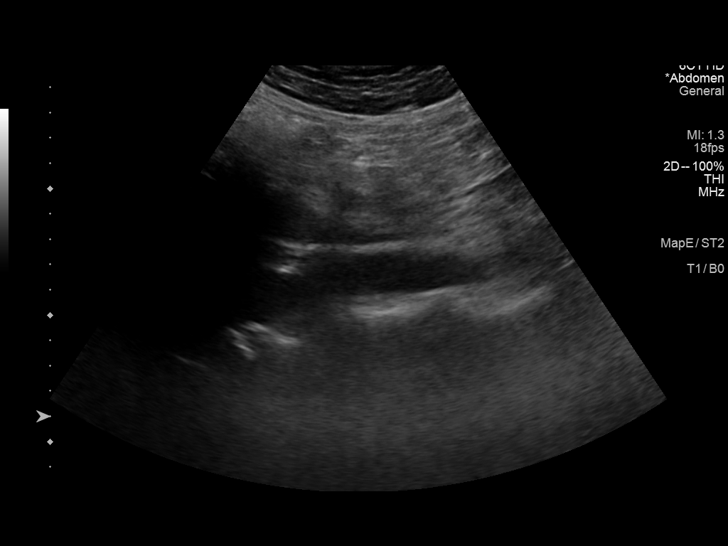
[im 7/15]
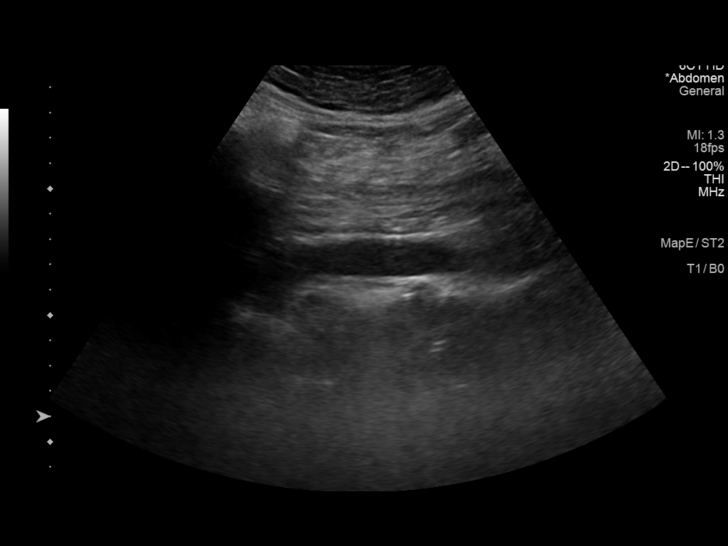
[im 9/15]
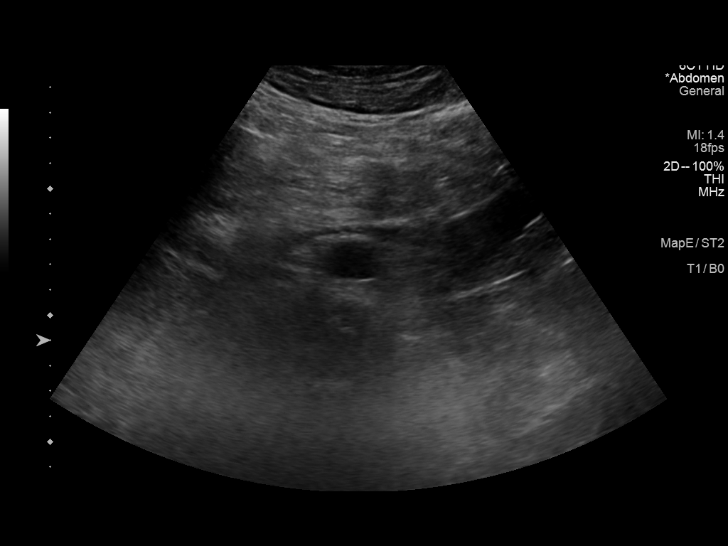
[im 10/15]
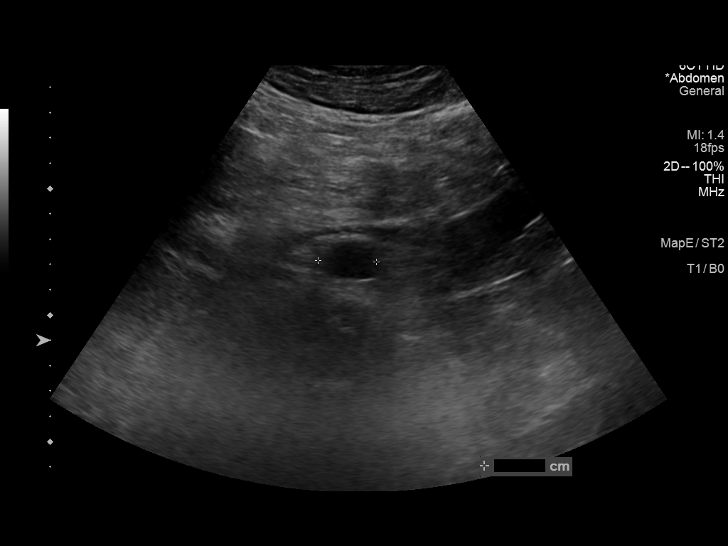
[im 11/15]
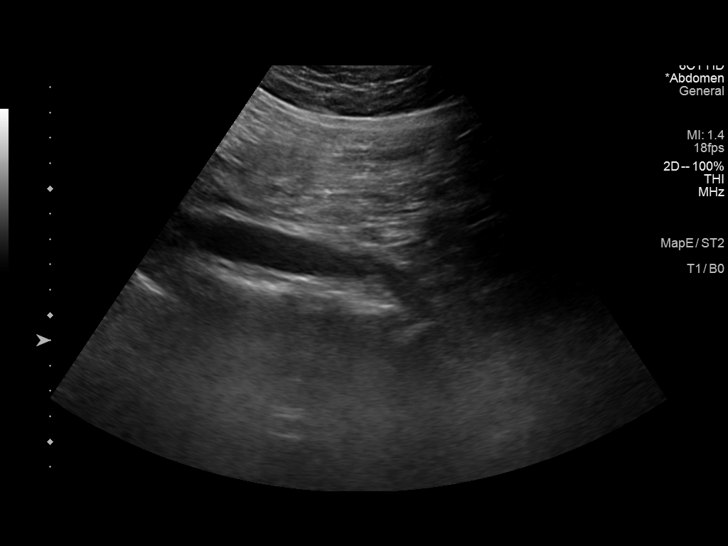
[im 12/15]
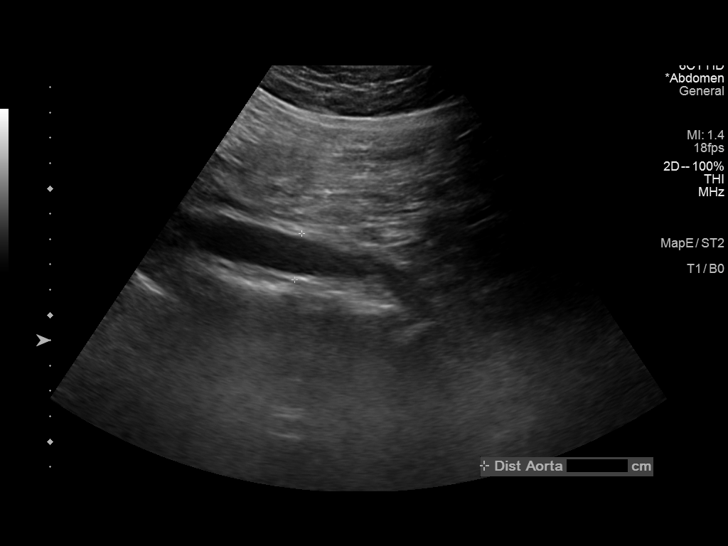
[im 13/15]
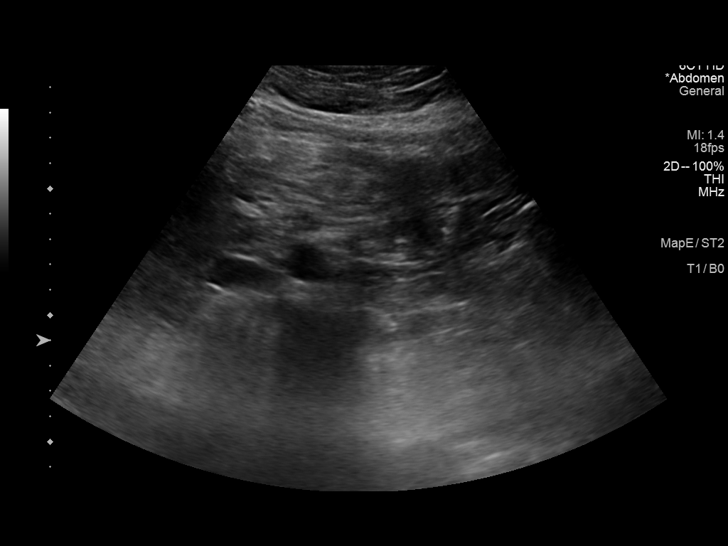
[im 14/15]
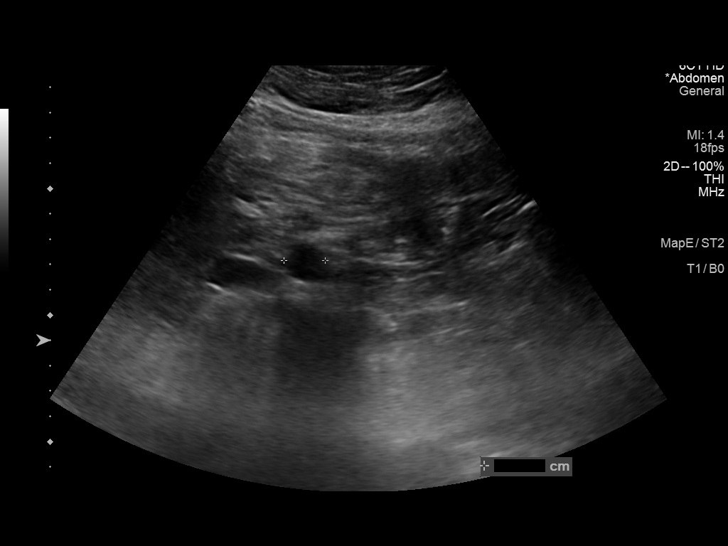
[im 15/15]
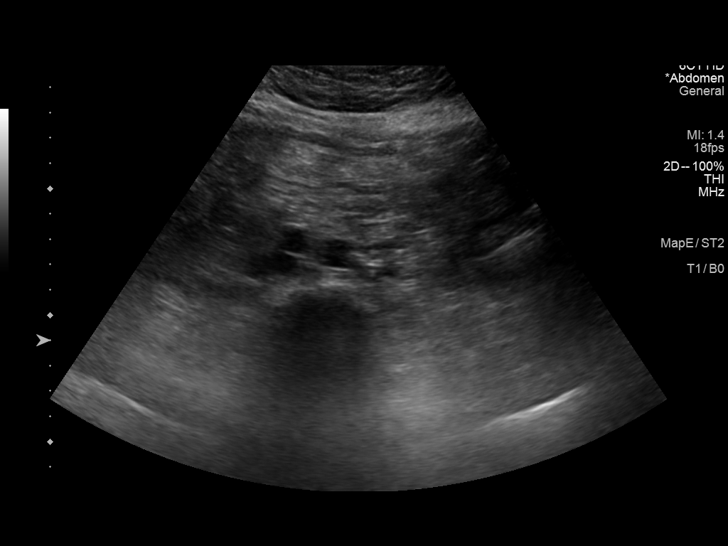

[14 of 15 positions shown; findings below may reference images not displayed]

FINDINGS: Abdominal aortic measurements as follows:

Proximal:  2.8 x 2.9 cm

Mid:  2.4 x 2.3 cm

Distal:  1.9 x 1.6 cm
IMPRESSION: No evidence of abdominal aortic aneurysm.

## 2021-11-12 DIAGNOSIS — H25813 Combined forms of age-related cataract, bilateral: Secondary | ICD-10-CM | POA: Diagnosis not present

## 2021-11-12 DIAGNOSIS — H524 Presbyopia: Secondary | ICD-10-CM | POA: Diagnosis not present

## 2021-11-29 DIAGNOSIS — M0579 Rheumatoid arthritis with rheumatoid factor of multiple sites without organ or systems involvement: Secondary | ICD-10-CM | POA: Diagnosis not present

## 2021-11-29 DIAGNOSIS — Z111 Encounter for screening for respiratory tuberculosis: Secondary | ICD-10-CM | POA: Diagnosis not present

## 2021-11-29 DIAGNOSIS — R5383 Other fatigue: Secondary | ICD-10-CM | POA: Diagnosis not present

## 2021-11-29 DIAGNOSIS — M542 Cervicalgia: Secondary | ICD-10-CM | POA: Diagnosis not present

## 2021-11-29 DIAGNOSIS — E669 Obesity, unspecified: Secondary | ICD-10-CM | POA: Diagnosis not present

## 2021-11-29 DIAGNOSIS — M65342 Trigger finger, left ring finger: Secondary | ICD-10-CM | POA: Diagnosis not present

## 2021-11-29 DIAGNOSIS — Z6832 Body mass index (BMI) 32.0-32.9, adult: Secondary | ICD-10-CM | POA: Diagnosis not present

## 2021-12-27 DIAGNOSIS — M542 Cervicalgia: Secondary | ICD-10-CM | POA: Diagnosis not present

## 2021-12-27 DIAGNOSIS — M65342 Trigger finger, left ring finger: Secondary | ICD-10-CM | POA: Diagnosis not present

## 2021-12-27 DIAGNOSIS — E669 Obesity, unspecified: Secondary | ICD-10-CM | POA: Diagnosis not present

## 2021-12-27 DIAGNOSIS — Z6832 Body mass index (BMI) 32.0-32.9, adult: Secondary | ICD-10-CM | POA: Diagnosis not present

## 2021-12-27 DIAGNOSIS — M0579 Rheumatoid arthritis with rheumatoid factor of multiple sites without organ or systems involvement: Secondary | ICD-10-CM | POA: Diagnosis not present

## 2022-03-28 DIAGNOSIS — R739 Hyperglycemia, unspecified: Secondary | ICD-10-CM | POA: Diagnosis not present

## 2022-03-28 DIAGNOSIS — E559 Vitamin D deficiency, unspecified: Secondary | ICD-10-CM | POA: Diagnosis not present

## 2022-03-28 DIAGNOSIS — I10 Essential (primary) hypertension: Secondary | ICD-10-CM | POA: Diagnosis not present

## 2022-03-28 DIAGNOSIS — Z125 Encounter for screening for malignant neoplasm of prostate: Secondary | ICD-10-CM | POA: Diagnosis not present

## 2022-04-04 DIAGNOSIS — H6123 Impacted cerumen, bilateral: Secondary | ICD-10-CM | POA: Diagnosis not present

## 2022-04-04 DIAGNOSIS — R7989 Other specified abnormal findings of blood chemistry: Secondary | ICD-10-CM | POA: Diagnosis not present

## 2022-04-04 DIAGNOSIS — M65342 Trigger finger, left ring finger: Secondary | ICD-10-CM | POA: Diagnosis not present

## 2022-04-04 DIAGNOSIS — J309 Allergic rhinitis, unspecified: Secondary | ICD-10-CM | POA: Diagnosis not present

## 2022-04-04 DIAGNOSIS — Z0000 Encounter for general adult medical examination without abnormal findings: Secondary | ICD-10-CM | POA: Diagnosis not present

## 2022-04-04 DIAGNOSIS — M0579 Rheumatoid arthritis with rheumatoid factor of multiple sites without organ or systems involvement: Secondary | ICD-10-CM | POA: Diagnosis not present

## 2022-04-04 DIAGNOSIS — E669 Obesity, unspecified: Secondary | ICD-10-CM | POA: Diagnosis not present

## 2022-04-04 DIAGNOSIS — Z6832 Body mass index (BMI) 32.0-32.9, adult: Secondary | ICD-10-CM | POA: Diagnosis not present

## 2022-04-04 DIAGNOSIS — M542 Cervicalgia: Secondary | ICD-10-CM | POA: Diagnosis not present

## 2022-05-29 DIAGNOSIS — G5603 Carpal tunnel syndrome, bilateral upper limbs: Secondary | ICD-10-CM | POA: Diagnosis not present

## 2022-06-13 DIAGNOSIS — M5412 Radiculopathy, cervical region: Secondary | ICD-10-CM | POA: Diagnosis not present

## 2022-06-19 DIAGNOSIS — G5603 Carpal tunnel syndrome, bilateral upper limbs: Secondary | ICD-10-CM | POA: Diagnosis not present

## 2022-06-29 DIAGNOSIS — M5412 Radiculopathy, cervical region: Secondary | ICD-10-CM | POA: Diagnosis not present

## 2022-07-04 DIAGNOSIS — M0579 Rheumatoid arthritis with rheumatoid factor of multiple sites without organ or systems involvement: Secondary | ICD-10-CM | POA: Diagnosis not present

## 2022-07-19 DIAGNOSIS — M542 Cervicalgia: Secondary | ICD-10-CM | POA: Diagnosis not present

## 2022-08-02 DIAGNOSIS — M79641 Pain in right hand: Secondary | ICD-10-CM | POA: Diagnosis not present

## 2022-08-02 DIAGNOSIS — M542 Cervicalgia: Secondary | ICD-10-CM | POA: Diagnosis not present

## 2022-08-02 DIAGNOSIS — M25641 Stiffness of right hand, not elsewhere classified: Secondary | ICD-10-CM | POA: Diagnosis not present

## 2022-08-07 DIAGNOSIS — M542 Cervicalgia: Secondary | ICD-10-CM | POA: Diagnosis not present

## 2022-08-07 DIAGNOSIS — M79641 Pain in right hand: Secondary | ICD-10-CM | POA: Diagnosis not present

## 2022-08-07 DIAGNOSIS — M25641 Stiffness of right hand, not elsewhere classified: Secondary | ICD-10-CM | POA: Diagnosis not present

## 2022-08-08 DIAGNOSIS — M5412 Radiculopathy, cervical region: Secondary | ICD-10-CM | POA: Diagnosis not present

## 2022-08-09 DIAGNOSIS — M542 Cervicalgia: Secondary | ICD-10-CM | POA: Diagnosis not present

## 2022-08-09 DIAGNOSIS — M25641 Stiffness of right hand, not elsewhere classified: Secondary | ICD-10-CM | POA: Diagnosis not present

## 2022-08-09 DIAGNOSIS — M79641 Pain in right hand: Secondary | ICD-10-CM | POA: Diagnosis not present

## 2022-08-21 DIAGNOSIS — M79641 Pain in right hand: Secondary | ICD-10-CM | POA: Diagnosis not present

## 2022-08-21 DIAGNOSIS — M542 Cervicalgia: Secondary | ICD-10-CM | POA: Diagnosis not present

## 2022-08-21 DIAGNOSIS — M25641 Stiffness of right hand, not elsewhere classified: Secondary | ICD-10-CM | POA: Diagnosis not present

## 2022-08-22 DIAGNOSIS — R29898 Other symptoms and signs involving the musculoskeletal system: Secondary | ICD-10-CM | POA: Diagnosis not present

## 2022-08-23 DIAGNOSIS — M25641 Stiffness of right hand, not elsewhere classified: Secondary | ICD-10-CM | POA: Diagnosis not present

## 2022-08-23 DIAGNOSIS — M542 Cervicalgia: Secondary | ICD-10-CM | POA: Diagnosis not present

## 2022-08-23 DIAGNOSIS — M79641 Pain in right hand: Secondary | ICD-10-CM | POA: Diagnosis not present

## 2022-08-29 ENCOUNTER — Telehealth: Payer: Self-pay

## 2022-08-29 NOTE — Patient Outreach (Signed)
  Care Coordination   08/29/2022 Name: Nathaniel [name] MRN: [id number] DOB: July 12, 1951   Care Coordination Outreach Attempts:  An unsuccessful telephone outreach was attempted today to offer the patient information about available care coordination services as a benefit of their health plan.   Follow Up Plan:  Additional outreach attempts will be made to offer the patient care coordination information and services.   Encounter Outcome:  No Answer  Care Coordination Interventions Activated:  No   Care Coordination Interventions:  No, not indicated    Jone Baseman, RN, MSN [facility] Care Management Care Management Coordinator Direct Line [phone]

## 2022-09-13 ENCOUNTER — Telehealth: Payer: Self-pay

## 2022-09-13 NOTE — Patient Outreach (Signed)
  Care Coordination   09/13/2022 Name: Nathaniel [name] MRN: [id number] DOB: 12/20/1950   Care Coordination Outreach Attempts:  A second unsuccessful outreach was attempted today to offer the patient with information about available care coordination services as a benefit of their health plan.     Follow Up Plan:  Additional outreach attempts will be made to offer the patient care coordination information and services.   Encounter Outcome:  No Answer  Care Coordination Interventions Activated:  No   Care Coordination Interventions:  No, not indicated    Jone Baseman, RN, MSN [facility] Care Management Care Management Coordinator Direct Line [phone]

## 2022-09-17 DIAGNOSIS — M542 Cervicalgia: Secondary | ICD-10-CM | POA: Diagnosis not present

## 2022-09-17 DIAGNOSIS — M25641 Stiffness of right hand, not elsewhere classified: Secondary | ICD-10-CM | POA: Diagnosis not present

## 2022-09-17 DIAGNOSIS — M79641 Pain in right hand: Secondary | ICD-10-CM | POA: Diagnosis not present

## 2022-09-30 ENCOUNTER — Telehealth: Payer: Self-pay

## 2022-09-30 NOTE — Patient Outreach (Signed)
  Care Coordination   09/30/2022 Name: Nathaniel [name] MRN: [id number] DOB: 19-Jan-1951   Care Coordination Outreach Attempts:  A third unsuccessful outreach was attempted today to offer the patient with information about available care coordination services as a benefit of their health plan.   Follow Up Plan:  No further outreach attempts will be made at this time. We have been unable to contact the patient to offer or enroll patient in care coordination services  Encounter Outcome:  No Answer  Care Coordination Interventions Activated:  No   Care Coordination Interventions:  No, not indicated    Jone Baseman, RN, MSN [place] Management Care Management Coordinator Direct Line [phone]

## 2022-10-10 DIAGNOSIS — M0579 Rheumatoid arthritis with rheumatoid factor of multiple sites without organ or systems involvement: Secondary | ICD-10-CM | POA: Diagnosis not present

## 2022-10-10 DIAGNOSIS — M542 Cervicalgia: Secondary | ICD-10-CM | POA: Diagnosis not present

## 2022-10-10 DIAGNOSIS — R7989 Other specified abnormal findings of blood chemistry: Secondary | ICD-10-CM | POA: Diagnosis not present

## 2022-10-10 DIAGNOSIS — M7989 Other specified soft tissue disorders: Secondary | ICD-10-CM | POA: Diagnosis not present

## 2022-10-10 DIAGNOSIS — M65342 Trigger finger, left ring finger: Secondary | ICD-10-CM | POA: Diagnosis not present

## 2022-10-10 DIAGNOSIS — Z6831 Body mass index (BMI) 31.0-31.9, adult: Secondary | ICD-10-CM | POA: Diagnosis not present

## 2022-10-10 DIAGNOSIS — E669 Obesity, unspecified: Secondary | ICD-10-CM | POA: Diagnosis not present

## 2022-11-14 DIAGNOSIS — H524 Presbyopia: Secondary | ICD-10-CM | POA: Diagnosis not present

## 2022-11-14 DIAGNOSIS — H25813 Combined forms of age-related cataract, bilateral: Secondary | ICD-10-CM | POA: Diagnosis not present

## 2023-02-05 ENCOUNTER — Encounter: Payer: Self-pay | Admitting: Internal Medicine

## 2023-02-05 ENCOUNTER — Other Ambulatory Visit: Payer: Self-pay | Admitting: Internal Medicine

## 2023-02-05 DIAGNOSIS — E669 Obesity, unspecified: Secondary | ICD-10-CM | POA: Diagnosis not present

## 2023-02-05 DIAGNOSIS — M069 Rheumatoid arthritis, unspecified: Secondary | ICD-10-CM

## 2023-02-05 DIAGNOSIS — R7989 Other specified abnormal findings of blood chemistry: Secondary | ICD-10-CM | POA: Diagnosis not present

## 2023-02-05 DIAGNOSIS — M542 Cervicalgia: Secondary | ICD-10-CM | POA: Diagnosis not present

## 2023-02-05 DIAGNOSIS — M0579 Rheumatoid arthritis with rheumatoid factor of multiple sites without organ or systems involvement: Secondary | ICD-10-CM | POA: Diagnosis not present

## 2023-02-05 DIAGNOSIS — M79641 Pain in right hand: Secondary | ICD-10-CM | POA: Diagnosis not present

## 2023-02-05 DIAGNOSIS — M65342 Trigger finger, left ring finger: Secondary | ICD-10-CM | POA: Diagnosis not present

## 2023-02-05 DIAGNOSIS — Z6831 Body mass index (BMI) 31.0-31.9, adult: Secondary | ICD-10-CM | POA: Diagnosis not present

## 2023-02-26 ENCOUNTER — Other Ambulatory Visit: Payer: Medicare PPO

## 2023-04-10 DIAGNOSIS — R739 Hyperglycemia, unspecified: Secondary | ICD-10-CM | POA: Diagnosis not present

## 2023-04-10 DIAGNOSIS — Z125 Encounter for screening for malignant neoplasm of prostate: Secondary | ICD-10-CM | POA: Diagnosis not present

## 2023-04-10 DIAGNOSIS — I10 Essential (primary) hypertension: Secondary | ICD-10-CM | POA: Diagnosis not present

## 2023-04-10 DIAGNOSIS — E559 Vitamin D deficiency, unspecified: Secondary | ICD-10-CM | POA: Diagnosis not present

## 2023-04-10 DIAGNOSIS — R319 Hematuria, unspecified: Secondary | ICD-10-CM | POA: Diagnosis not present

## 2023-04-10 DIAGNOSIS — E7800 Pure hypercholesterolemia, unspecified: Secondary | ICD-10-CM | POA: Diagnosis not present

## 2023-04-17 DIAGNOSIS — M0589 Other rheumatoid arthritis with rheumatoid factor of multiple sites: Secondary | ICD-10-CM | POA: Diagnosis not present

## 2023-04-17 DIAGNOSIS — Z23 Encounter for immunization: Secondary | ICD-10-CM | POA: Diagnosis not present

## 2023-04-17 DIAGNOSIS — Z0000 Encounter for general adult medical examination without abnormal findings: Secondary | ICD-10-CM | POA: Diagnosis not present

## 2023-04-17 DIAGNOSIS — I10 Essential (primary) hypertension: Secondary | ICD-10-CM | POA: Diagnosis not present

## 2023-04-29 DIAGNOSIS — E669 Obesity, unspecified: Secondary | ICD-10-CM | POA: Diagnosis not present

## 2023-04-29 DIAGNOSIS — Z6830 Body mass index (BMI) 30.0-30.9, adult: Secondary | ICD-10-CM | POA: Diagnosis not present

## 2023-04-29 DIAGNOSIS — M79641 Pain in right hand: Secondary | ICD-10-CM | POA: Diagnosis not present

## 2023-04-29 DIAGNOSIS — M542 Cervicalgia: Secondary | ICD-10-CM | POA: Diagnosis not present

## 2023-04-29 DIAGNOSIS — M65342 Trigger finger, left ring finger: Secondary | ICD-10-CM | POA: Diagnosis not present

## 2023-04-29 DIAGNOSIS — M0579 Rheumatoid arthritis with rheumatoid factor of multiple sites without organ or systems involvement: Secondary | ICD-10-CM | POA: Diagnosis not present

## 2023-04-29 DIAGNOSIS — R7989 Other specified abnormal findings of blood chemistry: Secondary | ICD-10-CM | POA: Diagnosis not present

## 2023-04-30 ENCOUNTER — Encounter: Payer: Self-pay | Admitting: Neurology

## 2023-06-10 ENCOUNTER — Ambulatory Visit: Payer: Medicare PPO | Admitting: Neurology

## 2023-06-10 ENCOUNTER — Encounter: Payer: Self-pay | Admitting: Neurology

## 2023-06-10 VITALS — BP 167/80 | HR 73 | Ht 67.0 in | Wt 200.0 lb

## 2023-06-10 DIAGNOSIS — R269 Unspecified abnormalities of gait and mobility: Secondary | ICD-10-CM

## 2023-06-10 DIAGNOSIS — R29898 Other symptoms and signs involving the musculoskeletal system: Secondary | ICD-10-CM

## 2023-06-10 DIAGNOSIS — R251 Tremor, unspecified: Secondary | ICD-10-CM

## 2023-06-10 DIAGNOSIS — R292 Abnormal reflex: Secondary | ICD-10-CM | POA: Diagnosis not present

## 2023-06-10 DIAGNOSIS — R258 Other abnormal involuntary movements: Secondary | ICD-10-CM | POA: Diagnosis not present

## 2023-06-10 MED ORDER — CYCLOBENZAPRINE HCL 5 MG PO TABS: 5.0000 mg | ORAL_TABLET | Freq: Every evening | ORAL | 1 refills | Status: AC | PRN
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
## 2023-06-10 NOTE — Patient Instructions (Signed)
[facility] will contact you to schedule MRI brain and DAT scan

## 2023-06-16 ENCOUNTER — Telehealth: Payer: Self-pay | Admitting: Neurology

## 2023-06-16 DIAGNOSIS — F419 Anxiety disorder, unspecified: Secondary | ICD-10-CM | POA: Diagnosis not present

## 2023-06-16 DIAGNOSIS — W57XXXA Bitten or stung by nonvenomous insect and other nonvenomous arthropods, initial encounter: Secondary | ICD-10-CM | POA: Diagnosis not present

## 2023-06-16 DIAGNOSIS — A849 Tick-borne viral encephalitis, unspecified: Secondary | ICD-10-CM | POA: Diagnosis not present

## 2023-06-16 MED ORDER — DIAZEPAM 5 MG PO TABS: ORAL_TABLET | ORAL | 0 refills | Status: AC

## 2023-06-16 NOTE — Telephone Encounter (Signed)
Rx for valium sent to his pharmacy.

## 2023-06-16 NOTE — Telephone Encounter (Signed)
Pt is calling in stating that he is having a MRI done on Saturday 06/21/2023 and he would like to see if Dr. Allena Katz would prescribe him a valium for the procedure due to him being claustrophobic.

## 2023-06-18 ENCOUNTER — Encounter: Payer: Self-pay | Admitting: Neurology

## 2023-06-18 NOTE — Telephone Encounter (Signed)
Called and let him know per Dr. Allena Katz Rx was sent to Pharmacy. He understood and thanked [name].

## 2023-06-21 ENCOUNTER — Ambulatory Visit
Admission: RE | Admit: 2023-06-21 | Discharge: 2023-06-21 | Disposition: A | Discharge status: Home / Self Care | Payer: Medicare PPO | Source: Ambulatory Visit | Attending: Neurology | Admitting: Neurology

## 2023-06-21 DIAGNOSIS — R258 Other abnormal involuntary movements: Secondary | ICD-10-CM | POA: Diagnosis not present

## 2023-06-21 DIAGNOSIS — R269 Unspecified abnormalities of gait and mobility: Secondary | ICD-10-CM | POA: Diagnosis not present

## 2023-06-21 DIAGNOSIS — R29898 Other symptoms and signs involving the musculoskeletal system: Secondary | ICD-10-CM

## 2023-06-21 DIAGNOSIS — R930 Abnormal findings on diagnostic imaging of skull and head, not elsewhere classified: Secondary | ICD-10-CM | POA: Diagnosis not present

## 2023-06-21 DIAGNOSIS — R292 Abnormal reflex: Secondary | ICD-10-CM

## 2023-06-30 ENCOUNTER — Encounter: Payer: Self-pay | Admitting: Neurology

## 2023-06-30 DIAGNOSIS — R258 Other abnormal involuntary movements: Secondary | ICD-10-CM

## 2023-06-30 DIAGNOSIS — R29898 Other symptoms and signs involving the musculoskeletal system: Secondary | ICD-10-CM

## 2023-06-30 DIAGNOSIS — R269 Unspecified abnormalities of gait and mobility: Secondary | ICD-10-CM

## 2023-06-30 DIAGNOSIS — R292 Abnormal reflex: Secondary | ICD-10-CM

## 2023-06-30 DIAGNOSIS — R9082 White matter disease, unspecified: Secondary | ICD-10-CM

## 2023-06-30 NOTE — Telephone Encounter (Signed)
Called and discussed results of MRI brain with patient which showed abnormality involving the right parieto-occipital region.  Etiology is unclear, so recommend MRI brain with contrast.  Interestingly, he denies any left sided symptoms.  Imaging obtained to evaluate right bradykinesia and rigidity.  He is scheduled to have DAT scan on 8/14.  All questions answered.

## 2023-07-01 ENCOUNTER — Encounter: Payer: Self-pay | Admitting: Neurology

## 2023-07-02 ENCOUNTER — Ambulatory Visit
Admission: RE | Admit: 2023-07-02 | Discharge: 2023-07-02 | Disposition: A | Discharge status: Home / Self Care | Payer: Medicare PPO | Source: Ambulatory Visit | Attending: Neurology | Admitting: Neurology

## 2023-07-02 DIAGNOSIS — R29898 Other symptoms and signs involving the musculoskeletal system: Secondary | ICD-10-CM

## 2023-07-02 DIAGNOSIS — R269 Unspecified abnormalities of gait and mobility: Secondary | ICD-10-CM

## 2023-07-02 DIAGNOSIS — R251 Tremor, unspecified: Secondary | ICD-10-CM | POA: Diagnosis not present

## 2023-07-02 DIAGNOSIS — R9082 White matter disease, unspecified: Secondary | ICD-10-CM

## 2023-07-02 DIAGNOSIS — R292 Abnormal reflex: Secondary | ICD-10-CM

## 2023-07-02 DIAGNOSIS — R258 Other abnormal involuntary movements: Secondary | ICD-10-CM

## 2023-07-02 MED ORDER — GADOPICLENOL 0.5 MMOL/ML IV SOLN
10.0000 mL | Freq: Once | INTRAVENOUS | Status: AC | PRN
  Administered 2023-07-02: 10 mL via INTRAVENOUS

## 2023-07-07 ENCOUNTER — Encounter: Payer: Self-pay | Admitting: Neurology

## 2023-07-09 ENCOUNTER — Encounter (HOSPITAL_COMMUNITY)
Admission: RE | Admit: 2023-07-09 | Discharge: 2023-07-09 | Disposition: A | Discharge status: Home / Self Care | Payer: Medicare PPO | Source: Ambulatory Visit | Attending: Neurology | Admitting: Neurology

## 2023-07-09 DIAGNOSIS — R251 Tremor, unspecified: Secondary | ICD-10-CM | POA: Diagnosis not present

## 2023-07-09 MED ORDER — POTASSIUM IODIDE (ANTIDOTE) 130 MG PO TABS
ORAL_TABLET | ORAL | Status: AC
  Filled 2023-07-09: qty 1

## 2023-07-09 MED ORDER — TECHNETIUM TC 99M MEBROFENIN IV KIT
4.9000 | PACK | Freq: Once | INTRAVENOUS | Status: AC | PRN
  Administered 2023-07-09: 4.9 via INTRAVENOUS

## 2023-07-22 ENCOUNTER — Encounter: Payer: Self-pay | Admitting: Neurology

## 2023-07-22 ENCOUNTER — Ambulatory Visit: Payer: Medicare PPO | Admitting: Neurology

## 2023-07-22 VITALS — BP 168/92 | HR 69 | Ht 67.0 in | Wt 198.0 lb

## 2023-07-22 DIAGNOSIS — G20C Parkinsonism, unspecified: Secondary | ICD-10-CM | POA: Diagnosis not present

## 2023-07-22 DIAGNOSIS — R930 Abnormal findings on diagnostic imaging of skull and head, not elsewhere classified: Secondary | ICD-10-CM | POA: Diagnosis not present

## 2023-07-22 MED ORDER — CARBIDOPA-LEVODOPA 25-100 MG PO TABS: 1.0000 | ORAL_TABLET | Freq: Three times a day (TID) | ORAL | 5 refills | Status: DC

## 2023-07-22 NOTE — Patient Instructions (Addendum)
Start Carbidopa Levodopa as follows at least 30-min prior to meals:     AM  Afternoon   Evening   Week 1:  1/2 tab  1/2 tab   1/2 tab  Week 2:   1/2 tab  1/2 tab   1 tab  Week 3:  1/2 tab  1 tab   1 tab  Week 4:  1 tab  1 tab   1 tab  *Avoid taking with protein products, such as milk, meat, cheese  *if you develop nausea, take with crackers  Start physical therapy  Repeat MRI brain in November  Follow-up with me in mid-November

## 2023-07-22 NOTE — Progress Notes (Unsigned)
Follow-up Visit   Date: 07/22/2023    Nathaniel [name] MRN: [id number] DOB: February 23, 1951    Nathaniel [name] is a 72 y.o. right-handed Caucasian male with RA, hypertension, GERD, and OSA returning to the clinic for follow-up of right side weakness.  The patient was accompanied to the clinic by wife who also provides collateral information.    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  Confirmed by DAT scan.  ?PSP. Discussed the diagnosis and management options.    - I will start sinemet 25/100 IR and titrate him to 1 tablet TID  - Start out-patient PT   - Parkinson's disease resources provided  2.   MRI brain shows nonenhancing confluent white matter abnormality over the right posterior parietal region.  He is asymptomatic.  Etiology of this is unclear.  He is on Humara and methotrexate for RA.  White matter changes have also been reported with methotrexate.  He tells me that he is only taking methotrexate 5mg  weekly (prescribed as 12.5mg ) because of GI upset. I contact his rheumatologist, Nathaniel [name], regarding risk of PML on these medications and if it would be reasonable to stop methotrexate.   CSF testing will be ordered to further evaluation these changes- Check CSF cell count and diff, protein, glucose, IgG index, oligoclonal bands, myelin basic protein, cytology, flow cytometry, JCV PCR.   Repeat MRI brain wwo contrast in 3 months.    Return to clinic in 3 months  --------------------------------------------- History of present illness: Starting in 2021, he began noticing weakness involving the right arm and leg.  He also feels that the right hand does not have dexterity, so has started to use his left hand.  He endorses stiffness in the hands. He struggles with fine motor tasks such as tying shoes or writing with the right hand.  He walks with a limp.  He has not had any falls and walks unassisted.  He denies  changes in smell or constipation.     He also complains of localized pain over the right upper arm.  Pain is achy and can sometimes radiate down the arm. 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  He saw Dr. Shon Baton who offered ESI and PT.   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   He lives at home with wife.     UPDATE 07/23/2023:  He is here to discuss the results of DAT scan and MRI brain.  DAT scan was consistent with parkinson's disease.  MRI brain showed white matter change over the right parietal region, which is nonenhancing.  He denies any left sided weakness or sensory changes.  No problems with vision or language. His right arm continues to be stiff and he struggles with fine motor tasks.  His gait is stiff and unchanged.   Medications:  Current Outpatient Medications on File Prior to Visit  Medication Sig Dispense Refill   acetaminophen (TYLENOL) 500 MG tablet Take 1,000 mg by mouth every 6 (six) hours as needed for moderate pain.     Adalimumab ([name] PEN [place]) Inject 0.4 mLs into the skin. 0.4 subcutaneous every 2 weeks for 30 days.     Ascorbic Acid (VITAMIN C) 100 MG tablet Take 100 mg by mouth daily.     cholecalciferol (VITAMIN D) 1000 UNITS tablet Take 1,000 Units by mouth daily after breakfast.  Coenzyme Q10 (CO Q-10) 100 MG CAPS Take 100 mg by mouth daily.     cyclobenzaprine (FLEXERIL) 5 MG tablet Take 1 tablet (5 mg total) by mouth at bedtime as needed for muscle spasms. 30 tablet 1   diazepam (VALIUM) 5 MG tablet Take 30-min prior to MRI. 1 tablet 0   diclofenac Sodium (VOLTAREN) 1 % GEL Apply 2 g topically 3 (three) times daily as needed (arthritis).     folic acid (FOLVITE) 1 MG tablet Take 1 mg by mouth daily after breakfast.      losartan (COZAAR) 100 MG tablet Take 50 mg by mouth daily after breakfast. Half a tablet daily     METHOTREXATE PO Take 2.5 mg  by mouth. 4 tablets by mouth one time weekly     traMADol (ULTRAM) 50 MG tablet Take 1-2 tablets (50-100 mg total) by mouth every 6 (six) hours as needed for moderate pain or severe pain. 20 tablet 0   vitamin E 400 UNIT capsule Take 400 Units by mouth daily after breakfast.     No current facility-administered medications on file prior to visit.    Allergies: No Known Allergies  Vital Signs:  BP (!) 168/92 Comment: Didn't take BP medication  Pulse 69   Ht 5\' 7"  (1.702 m)   Wt 198 lb (89.8 kg)   SpO2 97%   BMI 31.01 kg/m   Neurological Exam: MENTAL STATUS including orientation to time, place, person, recent and remote memory, attention span and concentration, language, and fund of knowledge is normal.  Speech is not dysarthric. Blunted affect. Reduced blink.   CRANIAL NERVES:  No visual field defects.  Pupils equal round and reactive to light.  Normal conjugate, extra-ocular eye movements in all directions of gaze.  No ptosis.  Face is symmetric. Palate elevates symmetrically.  Tongue is midline.  MOTOR:  Marked cogwheel of the right upper and lower extremity.  Tone in the left arm is normal.  Motor strength is 5/5 throughout.  MSRs:  Reflexes are 2+/4 throughout.  SENSORY:  Intact to vibration throughout.  COORDINATION/GAIT:  Normal finger-to- nose-finger.  Markedly slowed finger tapping with barely any movement of the fingers on the right.  Left finger tapping intact.  Toe and heel tapping is slowed on the right compared to the left. Gait appears slightly unsteady with rigidity in the right leg causing mild circumduction   Data: DAT scan 07/09/2023: Reduced bilateral putamen striatal Ioflupane activity as above. This pattern can be seen in Parkinsonian syndromes.  MRI brain wo contrast 06/30/2023: Abnormal T2 hyperintensity predominantly involving right parieto-occipital white matter, indeterminate. Considerations include infection (progressive multifocal leukoencephalopathy,  encephalitis), neoplasm/glioma, demyelinating disease, and other inflammatory processes. Postcontrast brain MRI is recommended for further evaluation.   MRI brain with contrast 07/13/2023: No enhancement of the stable confluent abnormal signal in the right parietal lobe, and no associated regional mass effect. No abnormal enhancement elsewhere.   This remains indeterminate, and although chronic nonspecific gliosis is possible a Low Grade Glioma is not excluded.  Total time spent reviewing records, interview, history/exam, documentation, and coordination of care on day of encounter:  45 min   Thank you for allowing me to participate in patient's care.  If I can answer any additional questions, I would be pleased to do so.    Sincerely,    Oluwadarasimi Redmon K. Allena Katz, DO

## 2023-07-23 ENCOUNTER — Encounter: Payer: Self-pay | Admitting: Neurology

## 2023-07-24 ENCOUNTER — Telehealth: Payer: Self-pay

## 2023-07-24 DIAGNOSIS — R930 Abnormal findings on diagnostic imaging of skull and head, not elsewhere classified: Secondary | ICD-10-CM

## 2023-07-24 DIAGNOSIS — R29898 Other symptoms and signs involving the musculoskeletal system: Secondary | ICD-10-CM

## 2023-07-24 DIAGNOSIS — R9082 White matter disease, unspecified: Secondary | ICD-10-CM

## 2023-07-24 DIAGNOSIS — G20C Parkinsonism, unspecified: Secondary | ICD-10-CM

## 2023-07-24 DIAGNOSIS — R269 Unspecified abnormalities of gait and mobility: Secondary | ICD-10-CM

## 2023-07-24 DIAGNOSIS — R251 Tremor, unspecified: Secondary | ICD-10-CM

## 2023-07-24 DIAGNOSIS — R258 Other abnormal involuntary movements: Secondary | ICD-10-CM

## 2023-07-24 DIAGNOSIS — R292 Abnormal reflex: Secondary | ICD-10-CM

## 2023-07-24 NOTE — Telephone Encounter (Signed)
-----   Message from Glendale Chard sent at 07/23/2023 12:53 PM EDT ----- Please order LP - check CSF cell count and diff, protein, glucose, IgG index, oligoclonal bands, myelin basic protein, cytology, flow cytometry, JCV PCR. Thank you.

## 2023-07-29 DIAGNOSIS — M0579 Rheumatoid arthritis with rheumatoid factor of multiple sites without organ or systems involvement: Secondary | ICD-10-CM | POA: Diagnosis not present

## 2023-07-31 DIAGNOSIS — Z08 Encounter for follow-up examination after completed treatment for malignant neoplasm: Secondary | ICD-10-CM | POA: Diagnosis not present

## 2023-07-31 DIAGNOSIS — L578 Other skin changes due to chronic exposure to nonionizing radiation: Secondary | ICD-10-CM | POA: Diagnosis not present

## 2023-07-31 DIAGNOSIS — D492 Neoplasm of unspecified behavior of bone, soft tissue, and skin: Secondary | ICD-10-CM | POA: Diagnosis not present

## 2023-07-31 DIAGNOSIS — Z85820 Personal history of malignant melanoma of skin: Secondary | ICD-10-CM | POA: Diagnosis not present

## 2023-07-31 DIAGNOSIS — D225 Melanocytic nevi of trunk: Secondary | ICD-10-CM | POA: Diagnosis not present

## 2023-07-31 DIAGNOSIS — L814 Other melanin hyperpigmentation: Secondary | ICD-10-CM | POA: Diagnosis not present

## 2023-07-31 DIAGNOSIS — L570 Actinic keratosis: Secondary | ICD-10-CM | POA: Diagnosis not present

## 2023-07-31 DIAGNOSIS — Z85828 Personal history of other malignant neoplasm of skin: Secondary | ICD-10-CM | POA: Diagnosis not present

## 2023-07-31 DIAGNOSIS — L821 Other seborrheic keratosis: Secondary | ICD-10-CM | POA: Diagnosis not present

## 2023-08-04 DIAGNOSIS — Z6830 Body mass index (BMI) 30.0-30.9, adult: Secondary | ICD-10-CM | POA: Diagnosis not present

## 2023-08-04 DIAGNOSIS — M0579 Rheumatoid arthritis with rheumatoid factor of multiple sites without organ or systems involvement: Secondary | ICD-10-CM | POA: Diagnosis not present

## 2023-08-04 DIAGNOSIS — R5383 Other fatigue: Secondary | ICD-10-CM | POA: Diagnosis not present

## 2023-08-04 DIAGNOSIS — R7989 Other specified abnormal findings of blood chemistry: Secondary | ICD-10-CM | POA: Diagnosis not present

## 2023-08-04 DIAGNOSIS — M542 Cervicalgia: Secondary | ICD-10-CM | POA: Diagnosis not present

## 2023-08-04 DIAGNOSIS — Z111 Encounter for screening for respiratory tuberculosis: Secondary | ICD-10-CM | POA: Diagnosis not present

## 2023-08-04 DIAGNOSIS — M65342 Trigger finger, left ring finger: Secondary | ICD-10-CM | POA: Diagnosis not present

## 2023-08-04 DIAGNOSIS — M79641 Pain in right hand: Secondary | ICD-10-CM | POA: Diagnosis not present

## 2023-08-04 DIAGNOSIS — E669 Obesity, unspecified: Secondary | ICD-10-CM | POA: Diagnosis not present
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
## 2023-08-06 ENCOUNTER — Other Ambulatory Visit: Payer: Self-pay

## 2023-08-06 ENCOUNTER — Ambulatory Visit: Payer: Medicare PPO | Attending: Neurology | Admitting: Physical Therapy

## 2023-08-06 ENCOUNTER — Encounter: Payer: Self-pay | Admitting: Physical Therapy

## 2023-08-06 DIAGNOSIS — G20C Parkinsonism, unspecified: Secondary | ICD-10-CM | POA: Insufficient documentation

## 2023-08-06 DIAGNOSIS — R29818 Other symptoms and signs involving the nervous system: Secondary | ICD-10-CM | POA: Insufficient documentation

## 2023-08-06 DIAGNOSIS — M6281 Muscle weakness (generalized): Secondary | ICD-10-CM | POA: Insufficient documentation

## 2023-08-06 DIAGNOSIS — R930 Abnormal findings on diagnostic imaging of skull and head, not elsewhere classified: Secondary | ICD-10-CM | POA: Insufficient documentation

## 2023-08-06 DIAGNOSIS — R29898 Other symptoms and signs involving the musculoskeletal system: Secondary | ICD-10-CM | POA: Diagnosis not present

## 2023-08-06 DIAGNOSIS — R2689 Other abnormalities of gait and mobility: Secondary | ICD-10-CM | POA: Insufficient documentation

## 2023-08-06 DIAGNOSIS — M5412 Radiculopathy, cervical region: Secondary | ICD-10-CM | POA: Insufficient documentation

## 2023-08-06 DIAGNOSIS — M62838 Other muscle spasm: Secondary | ICD-10-CM | POA: Diagnosis not present

## 2023-08-06 NOTE — Discharge Instructions (Signed)

## 2023-08-07 ENCOUNTER — Ambulatory Visit
Admission: RE | Admit: 2023-08-07 | Discharge: 2023-08-07 | Disposition: A | Discharge status: Home / Self Care | Payer: Medicare PPO | Source: Ambulatory Visit | Attending: Neurology | Admitting: Neurology

## 2023-08-07 VITALS — BP 159/78 | HR 81

## 2023-08-07 DIAGNOSIS — R29818 Other symptoms and signs involving the nervous system: Secondary | ICD-10-CM | POA: Diagnosis not present

## 2023-08-07 DIAGNOSIS — G20C Parkinsonism, unspecified: Secondary | ICD-10-CM | POA: Diagnosis not present

## 2023-08-07 DIAGNOSIS — M62838 Other muscle spasm: Secondary | ICD-10-CM | POA: Diagnosis not present

## 2023-08-07 DIAGNOSIS — R29898 Other symptoms and signs involving the musculoskeletal system: Secondary | ICD-10-CM

## 2023-08-07 DIAGNOSIS — R930 Abnormal findings on diagnostic imaging of skull and head, not elsewhere classified: Secondary | ICD-10-CM | POA: Diagnosis not present

## 2023-08-07 DIAGNOSIS — M6281 Muscle weakness (generalized): Secondary | ICD-10-CM | POA: Diagnosis not present

## 2023-08-07 DIAGNOSIS — R292 Abnormal reflex: Secondary | ICD-10-CM

## 2023-08-07 DIAGNOSIS — R269 Unspecified abnormalities of gait and mobility: Secondary | ICD-10-CM

## 2023-08-07 DIAGNOSIS — E663 Overweight: Secondary | ICD-10-CM

## 2023-08-07 DIAGNOSIS — R9082 White matter disease, unspecified: Secondary | ICD-10-CM

## 2023-08-07 DIAGNOSIS — M5412 Radiculopathy, cervical region: Secondary | ICD-10-CM | POA: Diagnosis not present

## 2023-08-07 DIAGNOSIS — R251 Tremor, unspecified: Secondary | ICD-10-CM | POA: Diagnosis not present

## 2023-08-07 DIAGNOSIS — R2689 Other abnormalities of gait and mobility: Secondary | ICD-10-CM | POA: Diagnosis not present

## 2023-08-07 DIAGNOSIS — R258 Other abnormal involuntary movements: Secondary | ICD-10-CM

## 2023-08-07 NOTE — Progress Notes (Signed)
1 vial of blood drawn from pts RAC to be sent off with LP lab work. 1 successful attempt, pt tolerated well. Gauze and tape applied after.  

## 2023-08-08 ENCOUNTER — Encounter: Payer: Self-pay | Admitting: Neurology

## 2023-08-08 ENCOUNTER — Encounter: Payer: Self-pay | Admitting: Physical Therapy

## 2023-08-08 ENCOUNTER — Ambulatory Visit: Payer: Medicare PPO | Admitting: Physical Therapy

## 2023-08-08 DIAGNOSIS — R29898 Other symptoms and signs involving the musculoskeletal system: Secondary | ICD-10-CM | POA: Diagnosis not present

## 2023-08-08 DIAGNOSIS — R2689 Other abnormalities of gait and mobility: Secondary | ICD-10-CM | POA: Diagnosis not present

## 2023-08-08 DIAGNOSIS — R29818 Other symptoms and signs involving the nervous system: Secondary | ICD-10-CM

## 2023-08-08 DIAGNOSIS — R930 Abnormal findings on diagnostic imaging of skull and head, not elsewhere classified: Secondary | ICD-10-CM | POA: Diagnosis not present

## 2023-08-08 DIAGNOSIS — M5412 Radiculopathy, cervical region: Secondary | ICD-10-CM | POA: Diagnosis not present

## 2023-08-08 DIAGNOSIS — M6281 Muscle weakness (generalized): Secondary | ICD-10-CM

## 2023-08-08 DIAGNOSIS — M62838 Other muscle spasm: Secondary | ICD-10-CM

## 2023-08-08 DIAGNOSIS — G20C Parkinsonism, unspecified: Secondary | ICD-10-CM | POA: Diagnosis not present

## 2023-08-08 LAB — CYTOLOGY - NON PAP
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
## 2023-08-11 ENCOUNTER — Encounter: Payer: Self-pay | Admitting: Physical Therapy

## 2023-08-11 ENCOUNTER — Ambulatory Visit: Payer: Medicare PPO | Admitting: Physical Therapy

## 2023-08-11 DIAGNOSIS — R930 Abnormal findings on diagnostic imaging of skull and head, not elsewhere classified: Secondary | ICD-10-CM | POA: Diagnosis not present

## 2023-08-11 DIAGNOSIS — M5412 Radiculopathy, cervical region: Secondary | ICD-10-CM | POA: Diagnosis not present

## 2023-08-11 DIAGNOSIS — R29898 Other symptoms and signs involving the musculoskeletal system: Secondary | ICD-10-CM | POA: Diagnosis not present

## 2023-08-11 DIAGNOSIS — M6281 Muscle weakness (generalized): Secondary | ICD-10-CM

## 2023-08-11 DIAGNOSIS — R29818 Other symptoms and signs involving the nervous system: Secondary | ICD-10-CM | POA: Diagnosis not present

## 2023-08-11 DIAGNOSIS — M62838 Other muscle spasm: Secondary | ICD-10-CM | POA: Diagnosis not present

## 2023-08-11 DIAGNOSIS — R2689 Other abnormalities of gait and mobility: Secondary | ICD-10-CM | POA: Diagnosis not present

## 2023-08-11 DIAGNOSIS — G20C Parkinsonism, unspecified: Secondary | ICD-10-CM | POA: Diagnosis not present
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
## 2023-08-12 ENCOUNTER — Encounter: Payer: Self-pay | Admitting: Neurology

## 2023-08-13 ENCOUNTER — Ambulatory Visit: Payer: Medicare PPO | Admitting: Physical Therapy

## 2023-08-13 DIAGNOSIS — M62838 Other muscle spasm: Secondary | ICD-10-CM | POA: Diagnosis not present

## 2023-08-13 DIAGNOSIS — R930 Abnormal findings on diagnostic imaging of skull and head, not elsewhere classified: Secondary | ICD-10-CM | POA: Diagnosis not present

## 2023-08-13 DIAGNOSIS — R29898 Other symptoms and signs involving the musculoskeletal system: Secondary | ICD-10-CM | POA: Diagnosis not present

## 2023-08-13 DIAGNOSIS — M5412 Radiculopathy, cervical region: Secondary | ICD-10-CM

## 2023-08-13 DIAGNOSIS — Z23 Encounter for immunization: Secondary | ICD-10-CM | POA: Diagnosis not present

## 2023-08-13 DIAGNOSIS — G20C Parkinsonism, unspecified: Secondary | ICD-10-CM | POA: Diagnosis not present

## 2023-08-13 DIAGNOSIS — R29818 Other symptoms and signs involving the nervous system: Secondary | ICD-10-CM | POA: Diagnosis not present

## 2023-08-13 DIAGNOSIS — R2689 Other abnormalities of gait and mobility: Secondary | ICD-10-CM

## 2023-08-13 DIAGNOSIS — H6123 Impacted cerumen, bilateral: Secondary | ICD-10-CM | POA: Diagnosis not present

## 2023-08-13 DIAGNOSIS — M6281 Muscle weakness (generalized): Secondary | ICD-10-CM

## 2023-08-13 LAB — CSF CELL COUNT WITH DIFFERENTIAL
RBC Count, CSF: 1 {cells}/uL — ABNORMAL HIGH
TOTAL NUCLEATED CELL: 1 {cells}/uL (ref 0–5)

## 2023-08-13 LAB — CNS IGG SYNTHESIS RATE, CSF+BLOOD
Albumin Serum: 4.4 g/dL (ref 3.6–5.1)
Albumin, CSF: 28.9 mg/dL (ref 8.0–42.0)
CNS-IgG Synthesis Rate: -6.6 mg/(24.h) (ref <=3.3)
IgG (Immunoglobin G), Serum: 1340 mg/dL (ref 600–1540)
IgG Total CSF: 3.6 mg/dL (ref 0.8–7.7)
IgG-Index: 0.41 (ref <0.70)

## 2023-08-13 LAB — PROTEIN, CSF: Total Protein, CSF: 52 mg/dL (ref 15–60)

## 2023-08-13 LAB — MYELIN BASIC PROTEIN, CSF: Myelin Basic Protein: <2 ug/L (ref <=4.0)

## 2023-08-13 LAB — GLUCOSE, CSF: Glucose, CSF: 59 mg/dL (ref 40–80)

## 2023-08-13 LAB — JC POLYOMA VIRUS DNA,QL,R-T,PCR CSF: JC Polyoma Virus DNA, QL: NOT DETECTED
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
## 2023-08-14 ENCOUNTER — Encounter: Payer: Self-pay | Admitting: Physical Therapy

## 2023-08-14 ENCOUNTER — Ambulatory Visit: Payer: Medicare PPO | Admitting: Physical Therapy

## 2023-08-14 DIAGNOSIS — M5412 Radiculopathy, cervical region: Secondary | ICD-10-CM

## 2023-08-14 DIAGNOSIS — R2689 Other abnormalities of gait and mobility: Secondary | ICD-10-CM | POA: Diagnosis not present

## 2023-08-14 DIAGNOSIS — G20C Parkinsonism, unspecified: Secondary | ICD-10-CM | POA: Diagnosis not present

## 2023-08-14 DIAGNOSIS — R29898 Other symptoms and signs involving the musculoskeletal system: Secondary | ICD-10-CM | POA: Diagnosis not present

## 2023-08-14 DIAGNOSIS — R930 Abnormal findings on diagnostic imaging of skull and head, not elsewhere classified: Secondary | ICD-10-CM | POA: Diagnosis not present

## 2023-08-14 DIAGNOSIS — R29818 Other symptoms and signs involving the nervous system: Secondary | ICD-10-CM | POA: Diagnosis not present

## 2023-08-14 DIAGNOSIS — M6281 Muscle weakness (generalized): Secondary | ICD-10-CM

## 2023-08-14 DIAGNOSIS — M62838 Other muscle spasm: Secondary | ICD-10-CM | POA: Diagnosis not present

## 2023-08-14 NOTE — Therapy (Signed)
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 Guan, PT 08/14/2023, 7:45 PM

## 2023-08-18 ENCOUNTER — Encounter: Payer: Medicare PPO | Admitting: Physical Therapy

## 2023-08-20 ENCOUNTER — Encounter: Payer: Medicare PPO | Admitting: Physical Therapy

## 2023-08-21 ENCOUNTER — Encounter: Payer: Medicare PPO | Admitting: Physical Therapy

## 2023-08-25 ENCOUNTER — Encounter: Payer: Self-pay | Admitting: Neurology

## 2023-08-25 ENCOUNTER — Ambulatory Visit: Payer: Medicare PPO | Admitting: Physical Therapy

## 2023-08-25 ENCOUNTER — Encounter: Payer: Self-pay | Admitting: Physical Therapy

## 2023-08-25 DIAGNOSIS — R29898 Other symptoms and signs involving the musculoskeletal system: Secondary | ICD-10-CM | POA: Diagnosis not present

## 2023-08-25 DIAGNOSIS — R2689 Other abnormalities of gait and mobility: Secondary | ICD-10-CM | POA: Diagnosis not present

## 2023-08-25 DIAGNOSIS — M62838 Other muscle spasm: Secondary | ICD-10-CM

## 2023-08-25 DIAGNOSIS — M6281 Muscle weakness (generalized): Secondary | ICD-10-CM | POA: Diagnosis not present

## 2023-08-25 DIAGNOSIS — M5412 Radiculopathy, cervical region: Secondary | ICD-10-CM | POA: Diagnosis not present

## 2023-08-25 DIAGNOSIS — G20C Parkinsonism, unspecified: Secondary | ICD-10-CM | POA: Diagnosis not present

## 2023-08-25 DIAGNOSIS — R29818 Other symptoms and signs involving the nervous system: Secondary | ICD-10-CM | POA: Diagnosis not present

## 2023-08-25 DIAGNOSIS — R930 Abnormal findings on diagnostic imaging of skull and head, not elsewhere classified: Secondary | ICD-10-CM | POA: Diagnosis not present
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
## 2023-08-26 ENCOUNTER — Encounter: Payer: Self-pay | Admitting: Physical Therapy

## 2023-08-26 ENCOUNTER — Ambulatory Visit: Payer: Medicare PPO | Attending: Neurology | Admitting: Physical Therapy

## 2023-08-26 DIAGNOSIS — R29898 Other symptoms and signs involving the musculoskeletal system: Secondary | ICD-10-CM

## 2023-08-26 DIAGNOSIS — M5412 Radiculopathy, cervical region: Secondary | ICD-10-CM | POA: Diagnosis not present

## 2023-08-26 DIAGNOSIS — R2689 Other abnormalities of gait and mobility: Secondary | ICD-10-CM | POA: Diagnosis not present

## 2023-08-26 DIAGNOSIS — R29818 Other symptoms and signs involving the nervous system: Secondary | ICD-10-CM

## 2023-08-26 DIAGNOSIS — M62838 Other muscle spasm: Secondary | ICD-10-CM

## 2023-08-26 DIAGNOSIS — M6281 Muscle weakness (generalized): Secondary | ICD-10-CM
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
## 2023-09-24 ENCOUNTER — Ambulatory Visit: Payer: Medicare PPO | Admitting: Physical Therapy

## 2023-09-26 ENCOUNTER — Encounter: Payer: Self-pay | Admitting: Physical Therapy

## 2023-09-26 ENCOUNTER — Ambulatory Visit: Payer: Medicare PPO | Attending: Neurology | Admitting: Physical Therapy

## 2023-09-26 DIAGNOSIS — R2689 Other abnormalities of gait and mobility: Secondary | ICD-10-CM | POA: Insufficient documentation

## 2023-09-26 DIAGNOSIS — M5412 Radiculopathy, cervical region: Secondary | ICD-10-CM | POA: Diagnosis not present

## 2023-09-26 DIAGNOSIS — M6281 Muscle weakness (generalized): Secondary | ICD-10-CM | POA: Diagnosis not present

## 2023-09-26 DIAGNOSIS — R29898 Other symptoms and signs involving the musculoskeletal system: Secondary | ICD-10-CM | POA: Insufficient documentation

## 2023-09-26 DIAGNOSIS — R29818 Other symptoms and signs involving the nervous system: Secondary | ICD-10-CM | POA: Insufficient documentation

## 2023-09-26 DIAGNOSIS — M62838 Other muscle spasm: Secondary | ICD-10-CM | POA: Insufficient documentation
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
## 2023-09-29 ENCOUNTER — Ambulatory Visit: Payer: Medicare PPO | Admitting: Physical Therapy

## 2023-09-29 ENCOUNTER — Encounter: Payer: Self-pay | Admitting: Physical Therapy

## 2023-09-29 ENCOUNTER — Encounter: Payer: Self-pay | Admitting: Neurology

## 2023-09-29 DIAGNOSIS — M6281 Muscle weakness (generalized): Secondary | ICD-10-CM | POA: Diagnosis not present

## 2023-09-29 DIAGNOSIS — R29818 Other symptoms and signs involving the nervous system: Secondary | ICD-10-CM | POA: Diagnosis not present

## 2023-09-29 DIAGNOSIS — R2689 Other abnormalities of gait and mobility: Secondary | ICD-10-CM

## 2023-09-29 DIAGNOSIS — R29898 Other symptoms and signs involving the musculoskeletal system: Secondary | ICD-10-CM | POA: Diagnosis not present

## 2023-09-29 DIAGNOSIS — M5412 Radiculopathy, cervical region: Secondary | ICD-10-CM | POA: Diagnosis not present

## 2023-09-29 DIAGNOSIS — M62838 Other muscle spasm: Secondary | ICD-10-CM | POA: Diagnosis not present

## 2023-09-29 NOTE — Therapy (Signed)
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 abnormal muscle tension and TPs in R UT and deltoids +/- wrist flexor group; R UE and postural stretching, postural correction and strengthening; LE stretching and strengthening; review any questions regarding education on community resources related to Parkinson's disease  Marry Guan, PT 09/29/2023, 5:32 PM

## 2023-09-30 ENCOUNTER — Ambulatory Visit
Admission: RE | Admit: 2023-09-30 | Discharge: 2023-09-30 | Disposition: A | Discharge status: Home / Self Care | Payer: Medicare PPO | Source: Ambulatory Visit | Attending: Neurology | Admitting: Neurology

## 2023-09-30 DIAGNOSIS — R930 Abnormal findings on diagnostic imaging of skull and head, not elsewhere classified: Secondary | ICD-10-CM

## 2023-09-30 DIAGNOSIS — G20C Parkinsonism, unspecified: Secondary | ICD-10-CM | POA: Diagnosis not present

## 2023-09-30 MED ORDER — GADOPICLENOL 0.5 MMOL/ML IV SOLN
10.0000 mL | Freq: Once | INTRAVENOUS | Status: AC | PRN
Start: 2023-09-30 — End: 2023-09-30
  Administered 2023-09-30: 10 mL via INTRAVENOUS

## 2023-10-01 ENCOUNTER — Ambulatory Visit: Payer: Medicare PPO | Admitting: Physical Therapy

## 2023-10-01 DIAGNOSIS — M62838 Other muscle spasm: Secondary | ICD-10-CM

## 2023-10-01 DIAGNOSIS — R2689 Other abnormalities of gait and mobility: Secondary | ICD-10-CM

## 2023-10-01 DIAGNOSIS — R29818 Other symptoms and signs involving the nervous system: Secondary | ICD-10-CM

## 2023-10-01 DIAGNOSIS — R29898 Other symptoms and signs involving the musculoskeletal system: Secondary | ICD-10-CM | POA: Diagnosis not present

## 2023-10-01 DIAGNOSIS — M6281 Muscle weakness (generalized): Secondary | ICD-10-CM

## 2023-10-01 DIAGNOSIS — M5412 Radiculopathy, cervical region: Secondary | ICD-10-CM | POA: Diagnosis not present

## 2023-10-01 NOTE — Therapy (Signed)
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 stretching, postural correction and strengthening; LE stretching and strengthening; review any questions regarding education on community resources related to Parkinson's disease  Marry Guan, PT 10/01/2023, 12:57 PM

## 2023-10-06 DIAGNOSIS — L570 Actinic keratosis: Secondary | ICD-10-CM | POA: Diagnosis not present

## 2023-10-08 DIAGNOSIS — M549 Dorsalgia, unspecified: Secondary | ICD-10-CM | POA: Diagnosis not present

## 2023-10-08 DIAGNOSIS — M25512 Pain in left shoulder: Secondary | ICD-10-CM | POA: Diagnosis not present

## 2023-10-09 ENCOUNTER — Encounter: Payer: Self-pay | Admitting: Physical Therapy

## 2023-10-09 ENCOUNTER — Ambulatory Visit: Payer: Medicare PPO | Admitting: Physical Therapy

## 2023-10-09 DIAGNOSIS — M5412 Radiculopathy, cervical region: Secondary | ICD-10-CM | POA: Diagnosis not present

## 2023-10-09 DIAGNOSIS — R29898 Other symptoms and signs involving the musculoskeletal system: Secondary | ICD-10-CM | POA: Diagnosis not present

## 2023-10-09 DIAGNOSIS — R2689 Other abnormalities of gait and mobility: Secondary | ICD-10-CM | POA: Diagnosis not present

## 2023-10-09 DIAGNOSIS — M6281 Muscle weakness (generalized): Secondary | ICD-10-CM

## 2023-10-09 DIAGNOSIS — M62838 Other muscle spasm: Secondary | ICD-10-CM | POA: Diagnosis not present

## 2023-10-09 DIAGNOSIS — R29818 Other symptoms and signs involving the nervous system: Secondary | ICD-10-CM | POA: Diagnosis not present

## 2023-10-09 NOTE — Therapy (Signed)
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 community fitness post d/c. Baseline:  Goal status: IN PROGRESS - 09/29/23 - education provided today and patient added to email list   PLAN:  PT FREQUENCY: 2-3x/week; up to 3x/wk for first 2 weeks as patient will be OOT for most of the month of October  PT DURATION: 12 weeks; extended duration due to patient will be gone for several weeks  PLANNED INTERVENTIONS: Therapeutic exercises, Therapeutic activity, Neuromuscular re-education, Balance training, Gait training, Patient/Family education, Self Care, Joint mobilization, Stair training, Dry Needling, Electrical stimulation, Spinal manipulation, Spinal mobilization, Cryotherapy, Moist heat, Taping, Ultrasound, Ionotophoresis 4mg /ml Dexamethasone, Manual therapy,  and Re-evaluation  PLAN FOR NEXT SESSION: review and progression of PWR! Moves; review of HEP PRN; MT +/- DN to address abnormal muscle tension and TPs in R UT and deltoids +/- wrist flexor group; R UE and postural stretching, postural correction and strengthening; LE stretching and strengthening; review any questions regarding education on community resources related to Parkinson's disease  Marry Guan, PT 10/09/2023, 11:02 AM

## 2023-10-10 ENCOUNTER — Encounter: Payer: Self-pay | Admitting: Physical Therapy

## 2023-10-10 ENCOUNTER — Ambulatory Visit: Payer: Medicare PPO | Admitting: Physical Therapy

## 2023-10-10 DIAGNOSIS — R2689 Other abnormalities of gait and mobility: Secondary | ICD-10-CM

## 2023-10-10 DIAGNOSIS — M6281 Muscle weakness (generalized): Secondary | ICD-10-CM | POA: Diagnosis not present

## 2023-10-10 DIAGNOSIS — R29818 Other symptoms and signs involving the nervous system: Secondary | ICD-10-CM

## 2023-10-10 DIAGNOSIS — M5412 Radiculopathy, cervical region: Secondary | ICD-10-CM

## 2023-10-10 DIAGNOSIS — R29898 Other symptoms and signs involving the musculoskeletal system: Secondary | ICD-10-CM

## 2023-10-10 DIAGNOSIS — M62838 Other muscle spasm: Secondary | ICD-10-CM | POA: Diagnosis not present

## 2023-10-10 NOTE — Therapy (Signed)
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 texting on phone Goal status: IN PROGRESS - 10/01/23 - starting to be able to use R hand with eating  9. Patient will verbalize understanding of local Parkinson's disease community resources, including community fitness post d/c. Baseline:  Goal status: IN PROGRESS - 09/29/23 - education provided today and patient added to email list   PLAN:  PT FREQUENCY: 2-3x/week; up to 3x/wk for first 2 weeks as patient will be OOT for most of the month of October  PT DURATION: 12 weeks; extended duration due to patient will be gone for several weeks  PLANNED INTERVENTIONS: Therapeutic exercises, Therapeutic activity, Neuromuscular re-education, Balance training, Gait training, Patient/Family education, Self Care, Joint mobilization, Stair training, Dry Needling,  Electrical stimulation, Spinal manipulation, Spinal mobilization, Cryotherapy, Moist heat, Taping, Ultrasound, Ionotophoresis 4mg /ml Dexamethasone, Manual therapy, and Re-evaluation  PLAN FOR NEXT SESSION: review and progression of PWR! Moves - training in quadruped/all 4's; review of HEP PRN; MT +/- DN to address abnormal muscle tension and TPs in R UT and deltoids +/- wrist flexor group; R UE and postural stretching, postural correction and strengthening; LE stretching and strengthening; review any questions regarding education on community resources related to Parkinson's disease  Marry Guan, PT 10/10/2023, 1:07 PM

## 2023-10-14 ENCOUNTER — Encounter: Payer: Self-pay | Admitting: Physical Therapy

## 2023-10-14 ENCOUNTER — Telehealth: Payer: Self-pay | Admitting: Neurology

## 2023-10-14 ENCOUNTER — Ambulatory Visit: Payer: Medicare PPO | Admitting: Physical Therapy

## 2023-10-14 DIAGNOSIS — R2689 Other abnormalities of gait and mobility: Secondary | ICD-10-CM

## 2023-10-14 DIAGNOSIS — M6281 Muscle weakness (generalized): Secondary | ICD-10-CM | POA: Diagnosis not present

## 2023-10-14 DIAGNOSIS — R29818 Other symptoms and signs involving the nervous system: Secondary | ICD-10-CM | POA: Diagnosis not present

## 2023-10-14 DIAGNOSIS — M5412 Radiculopathy, cervical region: Secondary | ICD-10-CM

## 2023-10-14 DIAGNOSIS — R29898 Other symptoms and signs involving the musculoskeletal system: Secondary | ICD-10-CM | POA: Diagnosis not present

## 2023-10-14 DIAGNOSIS — M62838 Other muscle spasm: Secondary | ICD-10-CM | POA: Diagnosis not present

## 2023-10-14 NOTE — Therapy (Signed)
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 October  PT DURATION: 12 weeks; extended duration due to patient will be gone for several weeks  PLANNED INTERVENTIONS: Therapeutic exercises, Therapeutic activity, Neuromuscular re-education, Balance training, Gait training, Patient/Family education, Self Care, Joint mobilization, Stair training, Dry Needling, Electrical stimulation, Spinal manipulation, Spinal mobilization, Cryotherapy, Moist heat, Taping, Ultrasound, Ionotophoresis 4mg /ml Dexamethasone, Manual therapy, and Re-evaluation  PLAN FOR NEXT SESSION: Training in stand to floor transfers; review of basic PWR! Moves as indicated including incorporation into everyday mobility; progression of PWR! Step patterns; review of HEP PRN; MT +/- DN to address abnormal muscle tension and TPs in R UT  and deltoids +/- wrist flexor group; R UE and postural stretching, postural correction and strengthening; LE stretching and strengthening; review any questions regarding education on community resources related to Parkinson's disease  Marry Guan, PT 10/14/2023, 5:37 PM

## 2023-10-14 NOTE — Telephone Encounter (Signed)
Patient has an appt with Allena Katz tomorrow but wants to know if this appt is needed since he has not received results from MRI. He is trying to save a co-pay if its not needed as of yet

## 2023-10-14 NOTE — Telephone Encounter (Signed)
Called patient and informed him that we are waiting for MRI to be read and will let him know if we need to cancel his appt. Patient is aware we will contact him before his appt time tomorrow to let him know.

## 2023-10-14 NOTE — Telephone Encounter (Signed)
Called Radiology for a Stat read of patients MRI. Request has been put in. Pending results.

## 2023-10-15 ENCOUNTER — Ambulatory Visit: Payer: Medicare PPO | Admitting: Neurology

## 2023-10-15 ENCOUNTER — Encounter: Payer: Self-pay | Admitting: Neurology

## 2023-10-15 VITALS — BP 160/82 | HR 71 | Ht 67.0 in | Wt 201.0 lb

## 2023-10-15 DIAGNOSIS — R9082 White matter disease, unspecified: Secondary | ICD-10-CM | POA: Diagnosis not present

## 2023-10-15 DIAGNOSIS — G20C Parkinsonism, unspecified: Secondary | ICD-10-CM | POA: Diagnosis not present

## 2023-10-15 NOTE — Progress Notes (Signed)
Follow-up Visit   Date: 10/15/2023    Nathaniel [name] MRN: [id number] DOB: 05-Sep-1951    Nathaniel [name] is a 72 y.o. right-handed Caucasian male with RA, hypertension, GERD, and OSA returning to the clinic for follow-up of right side weakness.  The patient was accompanied to the clinic by wife who also provides collateral information.    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  Confirmed by DAT scan.  ?PSP.   - Adjust sinemet 25/100 to 1 tablet twice daily at 9a and 3pm (behavior changes on higher dose)  - Start occupational therapy  - Continue physical therapy  2.   MRI brain shows nonenhancing confluent white matter abnormality over the right posterior parietal region.  He is asymptomatic.  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  CSF testing was normal. He has been off methotrexate.  Repeat MRI brain is stable without any progression.  I will recheck MRI brain wwo contrast in 6 months  Return to clinic in 6 months  --------------------------------------------- History of present illness: Starting in 2021, he began noticing weakness involving the right arm and leg.  He also feels that the right hand does not have dexterity, so has started to use his left hand.  He endorses stiffness in the hands. He struggles with fine motor tasks such as tying shoes or writing with the right hand.  He walks with a limp.  He has not had any falls and walks unassisted.  He denies changes in smell or constipation.     He also complains of localized pain over the right upper arm.  Pain is achy and can sometimes radiate down the arm. 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  He saw Dr. Shon Baton who offered ESI and PT.   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   He lives at home with wife.      UPDATE 07/23/2023:  He is here to discuss the results of DAT scan and MRI brain.  DAT scan was consistent with parkinson's disease.  MRI brain showed white matter change over the right parietal region, which is nonenhancing.  He denies any left sided weakness or sensory changes.  No problems with vision or language. His right arm continues to be stiff and he struggles with fine motor tasks.  His gait is stiff and unchanged.   UPDATE 10/15/2023:  He is here for follow-up visit.  He has been compliant with taking sinemet three times daily, but wife has noticed that he becomes easily agitated and irritable on the medication.  He has not noticed a marked change in his right arm stiffness.  He feels that PT is helping with staying active and his balance.  He had MRI brain which is stable.  No new complaints.   Medications:  Current Outpatient Medications on File Prior to Visit  Medication Sig Dispense Refill   Abatacept (ORENCIA CLICKJECT) 125 MG/ML SOAJ Inject 125 mg/mL into the skin once a week.     acetaminophen (TYLENOL) 500 MG tablet Take 1,000 mg by mouth every 6 (six) hours as needed for moderate pain (pain score 4-6).     Ascorbic Acid (VITAMIN C) 100 MG tablet Take 100 mg by mouth daily.     carbidopa-levodopa (SINEMET IR) 25-100 MG tablet Take 1 tablet by mouth 3 (three) times daily. 90  tablet 5   cholecalciferol (VITAMIN D) 1000 UNITS tablet Take 1,000 Units by mouth daily after breakfast.      Coenzyme Q10 (CO Q-10) 100 MG CAPS Take 100 mg by mouth daily.     cyclobenzaprine (FLEXERIL) 5 MG tablet Take 1 tablet (5 mg total) by mouth at bedtime as needed for muscle spasms. 30 tablet 1   diazepam (VALIUM) 5 MG tablet Take 30-min prior to MRI. 1 tablet 0   diclofenac Sodium (VOLTAREN) 1 % GEL Apply 2 g topically 3 (three) times daily as needed (arthritis).     folic acid (FOLVITE) 1 MG tablet Take 1 mg by mouth daily after breakfast.      losartan (COZAAR) 100 MG tablet Take 50 mg by mouth  daily after breakfast. Half a tablet daily     meloxicam (MOBIC) 15 MG tablet Take 15 mg by mouth daily as needed for pain (for pain). PRN for 30 days     vitamin E 400 UNIT capsule Take 400 Units by mouth daily after breakfast.     Adalimumab ([name] PEN [place]) Inject 0.4 mLs into the skin. 0.4 subcutaneous every 2 weeks for 30 days. (Patient not taking: Reported on 08/06/2023)     METHOTREXATE PO Take 2.5 mg by mouth. 4 tablets by mouth one time weekly (Patient not taking: Reported on 08/06/2023)     traMADol (ULTRAM) 50 MG tablet Take 1-2 tablets (50-100 mg total) by mouth every 6 (six) hours as needed for moderate pain or severe pain. (Patient not taking: Reported on 08/06/2023) 20 tablet 0   No current facility-administered medications on file prior to visit.    Allergies: No Known Allergies  Vital Signs:  BP (!) 160/82   Pulse 71   Ht 5\' 7"  (1.702 m)   Wt 201 lb (91.2 kg)   SpO2 96%   BMI 31.48 kg/m   Neurological Exam: MENTAL STATUS including orientation to time, place, person, recent and remote memory, attention span and concentration, language, and fund of knowledge is normal.  Speech is not dysarthric. Blunted affect. Reduced blink.   CRANIAL NERVES:  No visual field defects.  Pupils equal round and reactive to light.  Normal conjugate, extra-ocular eye movements in all directions of gaze.  No ptosis.  Face is symmetric.   MOTOR:  Slightly improvement in cogwheel of the right upper and lower extremity.  Tone in the left arm is normal.  Motor strength is 5/5 throughout.  MSRs:  Reflexes are 2+/4 throughout.  SENSORY:  Intact to vibration throughout.  COORDINATION/GAIT:  Normal finger-to- nose-finger.  Markedly slowed finger tapping on the right, slightly improved from before. Left finger tapping intact.  Toe and heel tapping is slowed on the right compared to the left. Gait appears slightly unsteady with rigidity in the right leg causing mild circumduction, poor arm swing on the  left  Data: DAT scan 07/09/2023: Reduced bilateral putamen striatal Ioflupane activity as above. This pattern can be seen in Parkinsonian syndromes.  MRI brain wo contrast 06/30/2023: Abnormal T2 hyperintensity predominantly involving right parieto-occipital white matter, indeterminate. Considerations include infection (progressive multifocal leukoencephalopathy, encephalitis), neoplasm/glioma, demyelinating disease, and other inflammatory processes. Postcontrast brain MRI is recommended for further evaluation.   MRI brain with contrast 07/13/2023: No enhancement of the stable confluent abnormal signal in the right parietal lobe, and no associated regional mass effect. No abnormal enhancement elsewhere.   This remains indeterminate, and although chronic nonspecific gliosis is possible a Low Grade Glioma is not excluded.  MRI Brain 09/30/2023: Unchanged area of confluent subcortical T2 hyperintense signal centered within the right parietal lobe and right occipital periventricular white matter. Given lack of cortical involvement, mass effect, and/or volume loss, this is favored to reflect sequela of prior inflammation or demyelination. Low-grade neoplasm remains a differential consideration.  CSF 08/07/2023:  R1 W1 P52 G59, JCV negative.    Thank you for allowing me to participate in patient's care.  If I can answer any additional questions, I would be pleased to do so.    Sincerely,    Willey Due K. Allena Katz, DO

## 2023-10-15 NOTE — Telephone Encounter (Signed)
Called patient and informed him that we have received the MRI results and he may keep his appointment for today. Patient verbalized understanding and will be here today.

## 2023-10-15 NOTE — Patient Instructions (Signed)
Start sinemet 25/100 1 tablet at 9a and 3p  Continue home exercises  Repeat MRI brain in 6 months

## 2023-10-16 ENCOUNTER — Ambulatory Visit: Payer: Self-pay | Admitting: Physical Therapy

## 2023-10-16 ENCOUNTER — Encounter: Payer: Self-pay | Admitting: Physical Therapy

## 2023-10-16 DIAGNOSIS — M62838 Other muscle spasm: Secondary | ICD-10-CM

## 2023-10-16 DIAGNOSIS — R29818 Other symptoms and signs involving the nervous system: Secondary | ICD-10-CM | POA: Diagnosis not present

## 2023-10-16 DIAGNOSIS — R2689 Other abnormalities of gait and mobility: Secondary | ICD-10-CM | POA: Diagnosis not present

## 2023-10-16 DIAGNOSIS — M5412 Radiculopathy, cervical region: Secondary | ICD-10-CM

## 2023-10-16 DIAGNOSIS — R29898 Other symptoms and signs involving the musculoskeletal system: Secondary | ICD-10-CM

## 2023-10-16 DIAGNOSIS — M6281 Muscle weakness (generalized): Secondary | ICD-10-CM | POA: Diagnosis not present

## 2023-10-16 NOTE — Therapy (Signed)
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 Ionotophoresis 4mg /ml Dexamethasone, Manual therapy, and Re-evaluation  PLAN FOR NEXT SESSION: review of basic PWR! Moves as indicated including incorporation into everyday mobility; progression of PWR! Step patterns; review of HEP PRN; MT +/- DN to address abnormal muscle tension and TPs in R UT and deltoids +/- wrist flexor group; R UE and postural stretching, postural correction and strengthening; LE stretching and strengthening; review any questions regarding education on community resources related to Parkinson's disease  Marry Guan, PT 10/16/2023, 6:22 PM

## 2023-10-17 ENCOUNTER — Encounter: Payer: Self-pay | Admitting: Physical Therapy

## 2023-10-20 ENCOUNTER — Encounter: Payer: Self-pay | Admitting: Physical Therapy

## 2023-10-20 ENCOUNTER — Ambulatory Visit: Payer: Medicare PPO | Admitting: Physical Therapy

## 2023-10-20 DIAGNOSIS — R29898 Other symptoms and signs involving the musculoskeletal system: Secondary | ICD-10-CM | POA: Diagnosis not present

## 2023-10-20 DIAGNOSIS — R2689 Other abnormalities of gait and mobility: Secondary | ICD-10-CM

## 2023-10-20 DIAGNOSIS — R29818 Other symptoms and signs involving the nervous system: Secondary | ICD-10-CM | POA: Diagnosis not present

## 2023-10-20 DIAGNOSIS — M6281 Muscle weakness (generalized): Secondary | ICD-10-CM

## 2023-10-20 DIAGNOSIS — M62838 Other muscle spasm: Secondary | ICD-10-CM | POA: Diagnosis not present

## 2023-10-20 DIAGNOSIS — M5412 Radiculopathy, cervical region: Secondary | ICD-10-CM | POA: Diagnosis not present

## 2023-10-20 NOTE — Therapy (Signed)
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 community fitness post d/c. Baseline:  Goal status: IN PROGRESS - 09/29/23 - education provided today and patient added to email list   PLAN:  PT FREQUENCY: 2-3x/week; up to 3x/wk for first 2 weeks as patient will be OOT for most of the month of October  PT DURATION: 12 weeks; extended duration due to patient will be gone for several weeks  PLANNED INTERVENTIONS: Therapeutic exercises, Therapeutic activity, Neuromuscular re-education, Balance training, Gait training, Patient/Family education, Self Care, Joint mobilization, Stair training, Dry Needling, Electrical stimulation, Spinal manipulation, Spinal mobilization, Cryotherapy, Moist heat, Taping, Ultrasound, Ionotophoresis 4mg /ml Dexamethasone, Manual therapy, and Re-evaluation  PLAN  FOR NEXT SESSION: review of basic PWR! Moves as indicated including incorporation into everyday mobility; progression of PWR! Step patterns; review of HEP PRN; MT +/- DN to address abnormal muscle tension and TPs in R UT and deltoids +/- wrist flexor group; R UE and postural stretching, postural correction and strengthening; LE stretching and strengthening; review any questions regarding education on community resources related to Parkinson's disease  Marry Guan, PT 10/20/2023, 3:30 PM

## 2023-10-22 ENCOUNTER — Ambulatory Visit: Payer: Medicare PPO | Admitting: Physical Therapy

## 2023-10-27 ENCOUNTER — Encounter: Payer: Self-pay | Admitting: Physical Therapy

## 2023-10-27 ENCOUNTER — Ambulatory Visit: Payer: Medicare PPO | Attending: Neurology | Admitting: Physical Therapy

## 2023-10-27 DIAGNOSIS — R29818 Other symptoms and signs involving the nervous system: Secondary | ICD-10-CM | POA: Diagnosis not present

## 2023-10-27 DIAGNOSIS — R2689 Other abnormalities of gait and mobility: Secondary | ICD-10-CM

## 2023-10-27 DIAGNOSIS — M62838 Other muscle spasm: Secondary | ICD-10-CM

## 2023-10-27 DIAGNOSIS — M5412 Radiculopathy, cervical region: Secondary | ICD-10-CM

## 2023-10-27 DIAGNOSIS — R278 Other lack of coordination: Secondary | ICD-10-CM | POA: Insufficient documentation

## 2023-10-27 DIAGNOSIS — M6281 Muscle weakness (generalized): Secondary | ICD-10-CM

## 2023-10-27 DIAGNOSIS — R29898 Other symptoms and signs involving the musculoskeletal system: Secondary | ICD-10-CM | POA: Diagnosis not present

## 2023-10-27 DIAGNOSIS — M25641 Stiffness of right hand, not elsewhere classified: Secondary | ICD-10-CM | POA: Diagnosis not present

## 2023-10-27 NOTE — Therapy (Signed)
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 still unable to use R hand for shaving and unable to sign his name - [place] will start OT on 10/30/23  9. Patient will verbalize understanding of local Parkinson's disease community resources, including community fitness post d/c. Baseline:  Goal status: IN PROGRESS - 10/27/23 - pt uncertain if he has received the emails   PLAN:  PT FREQUENCY: 1x/week  PT DURATION: 4-6 weeks; extended duration due to holidays  PLANNED INTERVENTIONS: Therapeutic exercises, Therapeutic activity, Neuromuscular re-education, Balance training, Gait training, Patient/Family education, Self Care, Joint mobilization, Stair training, Dry Needling, Electrical stimulation, Spinal manipulation, Spinal mobilization, Cryotherapy, Moist heat, Taping, Ultrasound, Ionotophoresis 4mg /ml  Dexamethasone, Manual therapy, and Re-evaluation  PLAN FOR NEXT SESSION: review of basic PWR! Moves as indicated including incorporation into everyday mobility; progression of PWR! Step patterns; review of HEP PRN; MT +/- DN to address abnormal muscle tension and TPs PRN; LE stretching and strengthening; review any questions regarding education on community resources related to Parkinson's disease  Marry Guan, PT 10/27/2023, 11:31 AM

## 2023-10-29 ENCOUNTER — Encounter: Payer: Self-pay | Admitting: Physical Therapy

## 2023-10-29 DIAGNOSIS — M898X1 Other specified disorders of bone, shoulder: Secondary | ICD-10-CM | POA: Diagnosis not present

## 2023-10-29 DIAGNOSIS — M0589 Other rheumatoid arthritis with rheumatoid factor of multiple sites: Secondary | ICD-10-CM | POA: Diagnosis not present

## 2023-10-29 DIAGNOSIS — G20C Parkinsonism, unspecified: Secondary | ICD-10-CM | POA: Diagnosis not present

## 2023-10-30 ENCOUNTER — Ambulatory Visit: Payer: Medicare PPO | Admitting: Occupational Therapy

## 2023-10-30 ENCOUNTER — Encounter: Payer: Self-pay | Admitting: Physical Therapy

## 2023-10-30 DIAGNOSIS — Z6830 Body mass index (BMI) 30.0-30.9, adult: Secondary | ICD-10-CM | POA: Diagnosis not present

## 2023-10-30 DIAGNOSIS — R278 Other lack of coordination: Secondary | ICD-10-CM | POA: Diagnosis not present

## 2023-10-30 DIAGNOSIS — R29818 Other symptoms and signs involving the nervous system: Secondary | ICD-10-CM | POA: Diagnosis not present

## 2023-10-30 DIAGNOSIS — M6281 Muscle weakness (generalized): Secondary | ICD-10-CM

## 2023-10-30 DIAGNOSIS — M25641 Stiffness of right hand, not elsewhere classified: Secondary | ICD-10-CM

## 2023-10-30 DIAGNOSIS — M5412 Radiculopathy, cervical region: Secondary | ICD-10-CM | POA: Diagnosis not present

## 2023-10-30 DIAGNOSIS — M79641 Pain in right hand: Secondary | ICD-10-CM | POA: Diagnosis not present

## 2023-10-30 DIAGNOSIS — M65342 Trigger finger, left ring finger: Secondary | ICD-10-CM | POA: Diagnosis not present

## 2023-10-30 DIAGNOSIS — E669 Obesity, unspecified: Secondary | ICD-10-CM | POA: Diagnosis not present

## 2023-10-30 DIAGNOSIS — R7989 Other specified abnormal findings of blood chemistry: Secondary | ICD-10-CM | POA: Diagnosis not present

## 2023-10-30 DIAGNOSIS — M62838 Other muscle spasm: Secondary | ICD-10-CM | POA: Diagnosis not present

## 2023-10-30 DIAGNOSIS — M0579 Rheumatoid arthritis with rheumatoid factor of multiple sites without organ or systems involvement: Secondary | ICD-10-CM | POA: Diagnosis not present

## 2023-10-30 DIAGNOSIS — R2689 Other abnormalities of gait and mobility: Secondary | ICD-10-CM | POA: Diagnosis not present

## 2023-10-30 DIAGNOSIS — R29898 Other symptoms and signs involving the musculoskeletal system: Secondary | ICD-10-CM

## 2023-10-30 DIAGNOSIS — M542 Cervicalgia: Secondary | ICD-10-CM | POA: Diagnosis not present
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
## 2023-11-04 ENCOUNTER — Ambulatory Visit: Payer: Medicare PPO | Admitting: Occupational Therapy

## 2023-11-04 DIAGNOSIS — M62838 Other muscle spasm: Secondary | ICD-10-CM | POA: Diagnosis not present

## 2023-11-04 DIAGNOSIS — M25641 Stiffness of right hand, not elsewhere classified: Secondary | ICD-10-CM

## 2023-11-04 DIAGNOSIS — R2689 Other abnormalities of gait and mobility: Secondary | ICD-10-CM | POA: Diagnosis not present

## 2023-11-04 DIAGNOSIS — R278 Other lack of coordination: Secondary | ICD-10-CM | POA: Diagnosis not present

## 2023-11-04 DIAGNOSIS — R29818 Other symptoms and signs involving the nervous system: Secondary | ICD-10-CM

## 2023-11-04 DIAGNOSIS — R29898 Other symptoms and signs involving the musculoskeletal system: Secondary | ICD-10-CM

## 2023-11-04 DIAGNOSIS — M6281 Muscle weakness (generalized): Secondary | ICD-10-CM | POA: Diagnosis not present

## 2023-11-04 DIAGNOSIS — M5412 Radiculopathy, cervical region: Secondary | ICD-10-CM | POA: Diagnosis not present

## 2023-11-04 NOTE — Patient Instructions (Addendum)
      ELBOW: Extension / Chest Press (Frame)    Lie on back with knees bent. Straighten elbows to raise paper towel roll then lower. Hold __5_ seconds.  __10_ reps per set, __1_ sets per day,   Shoulder: Flexion    With hands facing one another holding paper towel roll,  slowly lower paper towel roll to eye level  Do not let elbows bend. Keep back flat. Hold __5__ seconds. Repeat __10__ times. Do __1__ sessions per day.   Copyright  [employer]. All rights reserved.       PWR! Hands   PWR! Rock: Move wrists up and down [place]! Twist: Twist palms up and down BIG  Then, start with elbows bent and hands closed. PWR! Step: Touch index finger to thumb while keeping other fingers straight. Flick fingers out BIG (thumb out/straighten fingers). Repeat with other fingers. (Step your thumb to each finger). PWR! Hands: Push hands out BIG. Elbows straight, wrists up, fingers open and spread apart BIG. (Can also perform by pushing down on table, chair, knees. Push above head, out to the side, behind you, in front of you.)   ** Make each movement big and deliberate so that you feel the movement.   Perform at least 10 repetitions 1x/day, but perform PWR! hands throughout the day when you are having trouble using your hands (picking up/manipulating small objects, writing, eating, typing, sewing, buttoning, etc.).     Coordination Exercises  Perform the following exercises for 10 minutes 1 times per day. Perform with both hand(s). Perform using big movements.  Flipping Cards: Place deck of cards on the table. Flip cards over by opening your hand big to grasp and then turn your palm up big. Deal cards: Hold 1/2 or whole deck in your hand. Use thumb to push card off top of deck with one big push. Rotate ball with fingertips: Pick up with fingers/thumb and move as much as you can with each turn/movement (clockwise and counter-clockwise).  Practice writing: Slow down, write big, and focus on  forming each letter. Perform "Flicks"/hand stretches (PWR! Hands): Close hands then flick out your fingers with focus on opening hands, pulling wrists back, and extending elbows like you are pushing.
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
## 2023-11-06 ENCOUNTER — Ambulatory Visit: Payer: Medicare PPO | Admitting: Physical Therapy

## 2023-11-06 ENCOUNTER — Encounter: Payer: Self-pay | Admitting: Physical Therapy

## 2023-11-06 ENCOUNTER — Ambulatory Visit: Payer: Medicare PPO | Admitting: Occupational Therapy

## 2023-11-06 DIAGNOSIS — R29818 Other symptoms and signs involving the nervous system: Secondary | ICD-10-CM | POA: Diagnosis not present

## 2023-11-06 DIAGNOSIS — M25641 Stiffness of right hand, not elsewhere classified: Secondary | ICD-10-CM

## 2023-11-06 DIAGNOSIS — R2689 Other abnormalities of gait and mobility: Secondary | ICD-10-CM

## 2023-11-06 DIAGNOSIS — R29898 Other symptoms and signs involving the musculoskeletal system: Secondary | ICD-10-CM | POA: Diagnosis not present

## 2023-11-06 DIAGNOSIS — M62838 Other muscle spasm: Secondary | ICD-10-CM

## 2023-11-06 DIAGNOSIS — M5412 Radiculopathy, cervical region: Secondary | ICD-10-CM

## 2023-11-06 DIAGNOSIS — R278 Other lack of coordination: Secondary | ICD-10-CM | POA: Diagnosis not present

## 2023-11-06 DIAGNOSIS — M6281 Muscle weakness (generalized): Secondary | ICD-10-CM
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
## 2023-11-06 NOTE — Therapy (Signed)
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 Patient will verbalize understanding of local Parkinson's disease community resources, including community fitness post d/c. Baseline:  Goal status: IN PROGRESS - 11/06/23 - pt uncertain if he has received the emails - forwarded December email with patient to reply to confirm receipt   PLAN:  PT FREQUENCY: 1x/week  PT DURATION: 4-6 weeks; extended duration due to holidays  PLANNED INTERVENTIONS: Therapeutic exercises, Therapeutic activity, Neuromuscular re-education, Balance training, Gait training, Patient/Family education, Self Care, Joint mobilization, Stair training, Dry Needling, Electrical stimulation, Spinal manipulation, Spinal mobilization, Cryotherapy, Moist heat, Taping, Ultrasound, Ionotophoresis 4mg /ml Dexamethasone, Manual therapy, and Re-evaluation  PLAN FOR NEXT  SESSION: review of basic PWR! Moves as indicated including incorporation into everyday mobility; progression of PWR! Step patterns; review of HEP PRN; MT +/- DN to address abnormal muscle tension and TPs PRN; LE stretching and strengthening; review any questions regarding education on community resources related to Parkinson's disease  Marry Guan, PT 11/06/2023, 3:38 PM

## 2023-11-10 ENCOUNTER — Encounter: Payer: Self-pay | Admitting: Physical Therapy

## 2023-11-10 ENCOUNTER — Ambulatory Visit: Payer: Medicare PPO | Admitting: Physical Therapy

## 2023-11-10 DIAGNOSIS — R29818 Other symptoms and signs involving the nervous system: Secondary | ICD-10-CM

## 2023-11-10 DIAGNOSIS — M6281 Muscle weakness (generalized): Secondary | ICD-10-CM | POA: Diagnosis not present

## 2023-11-10 DIAGNOSIS — R2689 Other abnormalities of gait and mobility: Secondary | ICD-10-CM

## 2023-11-10 DIAGNOSIS — M25641 Stiffness of right hand, not elsewhere classified: Secondary | ICD-10-CM | POA: Diagnosis not present

## 2023-11-10 DIAGNOSIS — M62838 Other muscle spasm: Secondary | ICD-10-CM

## 2023-11-10 DIAGNOSIS — R29898 Other symptoms and signs involving the musculoskeletal system: Secondary | ICD-10-CM

## 2023-11-10 DIAGNOSIS — M5412 Radiculopathy, cervical region: Secondary | ICD-10-CM | POA: Diagnosis not present

## 2023-11-10 DIAGNOSIS — R278 Other lack of coordination: Secondary | ICD-10-CM | POA: Diagnosis not present

## 2023-11-10 NOTE — Therapy (Signed)
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 heat, Taping, Ultrasound, Ionotophoresis 4mg /ml Dexamethasone, Manual therapy, and Re-evaluation  PLAN FOR NEXT SESSION: review of basic PWR! Moves as indicated including incorporation into everyday mobility; progression of PWR! Step patterns; review of HEP PRN; MT +/- DN to address abnormal muscle tension and TPs PRN; LE stretching and strengthening; review any questions regarding education on community resources related to Parkinson's disease  Marry Guan, PT 11/10/2023, 6:43 PM

## 2023-11-11 ENCOUNTER — Ambulatory Visit: Payer: Medicare PPO | Attending: Neurology | Admitting: Occupational Therapy

## 2023-11-11 DIAGNOSIS — M62838 Other muscle spasm: Secondary | ICD-10-CM | POA: Insufficient documentation

## 2023-11-11 DIAGNOSIS — R278 Other lack of coordination: Secondary | ICD-10-CM | POA: Diagnosis not present

## 2023-11-11 DIAGNOSIS — M25641 Stiffness of right hand, not elsewhere classified: Secondary | ICD-10-CM | POA: Insufficient documentation

## 2023-11-11 DIAGNOSIS — M6281 Muscle weakness (generalized): Secondary | ICD-10-CM | POA: Insufficient documentation

## 2023-11-11 DIAGNOSIS — R29818 Other symptoms and signs involving the nervous system: Secondary | ICD-10-CM | POA: Diagnosis not present

## 2023-11-11 DIAGNOSIS — R2689 Other abnormalities of gait and mobility: Secondary | ICD-10-CM | POA: Diagnosis not present

## 2023-11-11 DIAGNOSIS — R29898 Other symptoms and signs involving the musculoskeletal system: Secondary | ICD-10-CM | POA: Insufficient documentation
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
## 2023-11-13 ENCOUNTER — Ambulatory Visit: Payer: Medicare PPO | Admitting: Occupational Therapy

## 2023-11-13 ENCOUNTER — Encounter: Payer: Self-pay | Admitting: Occupational Therapy

## 2023-11-13 DIAGNOSIS — M25641 Stiffness of right hand, not elsewhere classified: Secondary | ICD-10-CM

## 2023-11-13 DIAGNOSIS — R278 Other lack of coordination: Secondary | ICD-10-CM

## 2023-11-13 DIAGNOSIS — R29818 Other symptoms and signs involving the nervous system: Secondary | ICD-10-CM

## 2023-11-13 DIAGNOSIS — M62838 Other muscle spasm: Secondary | ICD-10-CM | POA: Diagnosis not present

## 2023-11-13 DIAGNOSIS — R2689 Other abnormalities of gait and mobility: Secondary | ICD-10-CM | POA: Diagnosis not present

## 2023-11-13 DIAGNOSIS — R29898 Other symptoms and signs involving the musculoskeletal system: Secondary | ICD-10-CM

## 2023-11-13 DIAGNOSIS — M6281 Muscle weakness (generalized): Secondary | ICD-10-CM | POA: Diagnosis not present
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
## 2023-11-17 ENCOUNTER — Ambulatory Visit: Payer: Medicare PPO | Admitting: Physical Therapy

## 2023-11-17 ENCOUNTER — Encounter: Payer: Self-pay | Admitting: Physical Therapy

## 2023-11-17 DIAGNOSIS — M25641 Stiffness of right hand, not elsewhere classified: Secondary | ICD-10-CM | POA: Diagnosis not present

## 2023-11-17 DIAGNOSIS — R2689 Other abnormalities of gait and mobility: Secondary | ICD-10-CM | POA: Diagnosis not present

## 2023-11-17 DIAGNOSIS — R278 Other lack of coordination: Secondary | ICD-10-CM | POA: Diagnosis not present

## 2023-11-17 DIAGNOSIS — R29898 Other symptoms and signs involving the musculoskeletal system: Secondary | ICD-10-CM

## 2023-11-17 DIAGNOSIS — M5412 Radiculopathy, cervical region: Secondary | ICD-10-CM | POA: Diagnosis not present

## 2023-11-17 DIAGNOSIS — M62838 Other muscle spasm: Secondary | ICD-10-CM

## 2023-11-17 DIAGNOSIS — M6281 Muscle weakness (generalized): Secondary | ICD-10-CM

## 2023-11-17 DIAGNOSIS — R29818 Other symptoms and signs involving the nervous system: Secondary | ICD-10-CM | POA: Diagnosis not present

## 2023-11-17 NOTE — Therapy (Signed)
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 improved functional strength and transfer efficiency. Baseline: 5xSTS - 12.12 sec (08/08/23) Goal status: MET - 08/08/23  6.  Patient will demonstrate at least 27/30 on FGA to improve gait stability and reduce risk for falls. (MCID = 4 points) Baseline: 23/30 (08/13/23) Goal status: MET - 10/01/23 - 27/30  7.  Patient will report 10% improvement on QuickDASH and/or 15% improvement on NDI to demonstrate improved function. Baseline: QuickDASH = 40.9 / 100 = 40.9%; NDI = 8 / 50 = 16.0% Goal status: MET - 10/01/23 - QuickDASH: 22.7 / 100 = 22.7% & NDI: 2 / 50 = 4.0%   8.  Patient will demonstrate improved functional use of R hand with ADLs. Baseline: Limited with fastening buttons, tying shoes, opening containers, texting on phone Goal status: DEFERRED to OT - 10/27/23 - able to  use R hand with eating, but still unable to use R hand for shaving and unable to sign his name - Taylan will start OT on 10/30/23  9. Patient will verbalize understanding of local Parkinson's disease community resources, including community fitness post d/c. Baseline:  Goal status: MET - 11/10/23    PLAN:  PT FREQUENCY: 1x/week  PT DURATION: 4-6 weeks; extended duration due to holidays  PLANNED INTERVENTIONS: Therapeutic exercises, Therapeutic activity, Neuromuscular re-education, Balance training, Gait training, Patient/Family education, Self Care, Joint mobilization, Stair training, Dry Needling, Electrical stimulation, Spinal manipulation, Spinal mobilization, Cryotherapy, Moist heat, Taping, Ultrasound, Ionotophoresis 4mg /ml Dexamethasone, Manual therapy, and Re-evaluation  PLAN FOR NEXT SESSION:  Standardized Parkinson screening testing and balance testing as indicated; goal assessment; anticipate transition to HEP; set up 24-month PD screen  Marry Guan, PT 11/17/2023, 3:13 PM

## 2023-12-01 ENCOUNTER — Encounter: Payer: Self-pay | Admitting: Physical Therapy

## 2023-12-01 ENCOUNTER — Ambulatory Visit: Payer: Medicare PPO | Attending: Neurology | Admitting: Occupational Therapy

## 2023-12-01 ENCOUNTER — Ambulatory Visit: Payer: Medicare PPO | Attending: Neurology | Admitting: Physical Therapy

## 2023-12-01 DIAGNOSIS — R29898 Other symptoms and signs involving the musculoskeletal system: Secondary | ICD-10-CM | POA: Insufficient documentation

## 2023-12-01 DIAGNOSIS — M6281 Muscle weakness (generalized): Secondary | ICD-10-CM

## 2023-12-01 DIAGNOSIS — R278 Other lack of coordination: Secondary | ICD-10-CM

## 2023-12-01 DIAGNOSIS — R2689 Other abnormalities of gait and mobility: Secondary | ICD-10-CM | POA: Diagnosis not present

## 2023-12-01 DIAGNOSIS — R29818 Other symptoms and signs involving the nervous system: Secondary | ICD-10-CM | POA: Diagnosis not present

## 2023-12-01 DIAGNOSIS — M25641 Stiffness of right hand, not elsewhere classified: Secondary | ICD-10-CM | POA: Diagnosis not present

## 2023-12-01 DIAGNOSIS — M62838 Other muscle spasm: Secondary | ICD-10-CM | POA: Diagnosis not present

## 2023-12-01 NOTE — Therapy (Signed)
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 and update goals as indicated. Baseline:  Goal status: MET  08/13/23   2.  Patient will be independent with initial HEP. Baseline: Initial HEP initiated on eval Goal status: MET  08/26/23   3.  Patient will demonstrate decreased fall risk by scoring < 13.5 sec on TUG. Baseline: TUG: Normal - 7.00 sec, Manual - 8.00 sec, Cognitive - 7.90 sec (08/08/23) Goal status: MET  08/08/23 - Met for all versions of TUG  LONG TERM GOALS: Target date: 10/29/2023  Patient will be independent with ongoing/advanced HEP for self-management at home incorporating PWR! Moves as indicated .  Baseline:  Goal status: IN PROGRESS - 12/01/23 - Met for current HEP, but updated today with new stretches  2.  Patient will be able to ambulate normal gait pattern and  improved reciprocal arm swing.  Baseline: R-sided limp with absent R arm swing Goal status: IN PROGRESS - 10/27/23 - continued decreased R arm swing with inconsistent R foot clearance  3.  Patient will be able to ascend/descend stairs with reciprocal gait with or w/o single rail use with good mechanics.  Baseline:  Goal status: MET - 10/20/23 - patient able to ascend/descend full flight of stairs reciprocally without need for railing, although he does note he often uses the railing on descent in the mornings particularly when his RA is acting up  4.  Patient will demonstrate gait speed of >/= 2.62 to ft/sec (0.8 m/s) to be a safe limited community ambulator with decreased risk for recurrent falls.  Baseline: Gait speed - 4.81 ft/sec (08/08/23) Goal status: MET - 08/08/23  5.  Patient will improve 5x STS time to </= 12.6 seconds to demonstrate improved functional strength and transfer efficiency. Baseline: 5xSTS - 12.12 sec (08/08/23) Goal status: MET - 08/08/23  6.  Patient will demonstrate  at least 27/30 on FGA to improve gait stability and reduce risk for falls. (MCID = 4 points) Baseline: 23/30 (08/13/23) Goal status: MET - 10/01/23 - 27/30  7.  Patient will report 10% improvement on QuickDASH and/or 15% improvement on NDI to demonstrate improved function. Baseline: QuickDASH = 40.9 / 100 = 40.9%; NDI = 8 / 50 = 16.0% Goal status: MET - 10/01/23 - QuickDASH: 22.7 / 100 = 22.7% & NDI: 2 / 50 = 4.0%   8.  Patient will demonstrate improved functional use of R hand with ADLs. Baseline: Limited with fastening buttons, tying shoes, opening containers, texting on phone Goal status: DEFERRED to OT - 10/27/23 - able to use R hand with eating, but still unable to use R hand for shaving and unable to sign his name - Christina will start OT on 10/30/23  9. Patient will verbalize understanding of local Parkinson's disease community resources, including community fitness post d/c. Baseline:  Goal status: MET -  11/10/23    PLAN:  PT FREQUENCY: 1x/week  PT DURATION: 4-6 weeks; extended duration due to holidays  PLANNED INTERVENTIONS: Therapeutic exercises, Therapeutic activity, Neuromuscular re-education, Balance training, Gait training, Patient/Family education, Self Care, Joint mobilization, Stair training, Dry Needling, Electrical stimulation, Spinal manipulation, Spinal mobilization, Cryotherapy, Moist heat, Taping, Ultrasound, Ionotophoresis 4mg /ml Dexamethasone , Manual therapy, and Re-evaluation  PLAN FOR NEXT SESSION: Assess response to DN and ionto patch; standardized Parkinson screening testing and balance testing as indicated; goal assessment; anticipate transition to HEP; set up 41-month PD screen  Elijah CHRISTELLA Hidden, PT 12/01/2023, 4:06 PM
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
## 2023-12-02 ENCOUNTER — Ambulatory Visit: Payer: Medicare PPO | Admitting: Occupational Therapy

## 2023-12-02 DIAGNOSIS — L728 Other follicular cysts of the skin and subcutaneous tissue: Secondary | ICD-10-CM | POA: Diagnosis not present

## 2023-12-02 DIAGNOSIS — L570 Actinic keratosis: Secondary | ICD-10-CM | POA: Diagnosis not present

## 2023-12-04 ENCOUNTER — Ambulatory Visit: Payer: Medicare PPO | Admitting: Occupational Therapy

## 2023-12-04 ENCOUNTER — Encounter: Payer: Self-pay | Admitting: Occupational Therapy

## 2023-12-04 DIAGNOSIS — R29818 Other symptoms and signs involving the nervous system: Secondary | ICD-10-CM

## 2023-12-04 DIAGNOSIS — R29898 Other symptoms and signs involving the musculoskeletal system: Secondary | ICD-10-CM

## 2023-12-04 DIAGNOSIS — M62838 Other muscle spasm: Secondary | ICD-10-CM | POA: Diagnosis not present

## 2023-12-04 DIAGNOSIS — R2689 Other abnormalities of gait and mobility: Secondary | ICD-10-CM

## 2023-12-04 DIAGNOSIS — M25641 Stiffness of right hand, not elsewhere classified: Secondary | ICD-10-CM | POA: Diagnosis not present

## 2023-12-04 DIAGNOSIS — R278 Other lack of coordination: Secondary | ICD-10-CM

## 2023-12-04 DIAGNOSIS — M6281 Muscle weakness (generalized): Secondary | ICD-10-CM
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
## 2023-12-08 ENCOUNTER — Encounter: Payer: Medicare PPO | Admitting: Physical Therapy

## 2023-12-08 ENCOUNTER — Ambulatory Visit: Payer: Medicare PPO | Admitting: Physical Therapy

## 2023-12-08 ENCOUNTER — Encounter: Payer: Self-pay | Admitting: Physical Therapy

## 2023-12-08 DIAGNOSIS — R29898 Other symptoms and signs involving the musculoskeletal system: Secondary | ICD-10-CM | POA: Diagnosis not present

## 2023-12-08 DIAGNOSIS — R29818 Other symptoms and signs involving the nervous system: Secondary | ICD-10-CM

## 2023-12-08 DIAGNOSIS — R2689 Other abnormalities of gait and mobility: Secondary | ICD-10-CM | POA: Diagnosis not present

## 2023-12-08 DIAGNOSIS — M62838 Other muscle spasm: Secondary | ICD-10-CM

## 2023-12-08 DIAGNOSIS — M6281 Muscle weakness (generalized): Secondary | ICD-10-CM

## 2023-12-08 NOTE — Therapy (Signed)
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 22.7% & NDI: 2 / 50 = 4.0%   8.  Patient will demonstrate improved functional use of R hand with ADLs. Baseline: Limited with fastening buttons, tying shoes, opening containers, texting on phone Goal status: DEFERRED to OT - 10/27/23 - able to use R hand with eating, but still unable to use R hand for shaving and unable to sign his name - [facility] will start OT on 10/30/23  9. Patient will verbalize understanding of local Parkinson's disease community resources, including community fitness post d/c. Baseline:  Goal status: MET - 11/10/23    PLAN:  PT FREQUENCY: 1x/week  PT DURATION: 4-6 weeks; extended duration due to holidays  PLANNED INTERVENTIONS: Therapeutic exercises, Therapeutic activity, Neuromuscular re-education, Balance training, Gait training, Patient/Family education, Self  Care, Joint mobilization, Stair training, Dry Needling, Electrical stimulation, Spinal manipulation, Spinal mobilization, Cryotherapy, Moist heat, Taping, Ultrasound, Ionotophoresis 4mg /ml Dexamethasone , Manual therapy, and Re-evaluation  PLAN FOR NEXT SESSION:  transition to HEP + discharge from PT;  68-month PD screen on 06/21/24   PHYSICAL THERAPY DISCHARGE SUMMARY  Visits from Start of Care: 21  Current functional level related to goals / functional outcomes: Refer to above clinical impression and goal assessment.   Remaining deficits: As above.  Recent onset R lateral hip/buttock pain.   Education / Equipment: HEP; PWR! Moves   Patient agrees to discharge. Patient  goals were met. Patient is being discharged due to meeting the stated rehab goals.   Elijah CHRISTELLA Hidden, PT 12/08/2023, 10:58 AM

## 2023-12-09 ENCOUNTER — Ambulatory Visit: Payer: Medicare PPO | Admitting: Occupational Therapy

## 2023-12-09 DIAGNOSIS — R2689 Other abnormalities of gait and mobility: Secondary | ICD-10-CM

## 2023-12-09 DIAGNOSIS — M25641 Stiffness of right hand, not elsewhere classified: Secondary | ICD-10-CM | POA: Diagnosis not present

## 2023-12-09 DIAGNOSIS — R29898 Other symptoms and signs involving the musculoskeletal system: Secondary | ICD-10-CM

## 2023-12-09 DIAGNOSIS — M6281 Muscle weakness (generalized): Secondary | ICD-10-CM

## 2023-12-09 DIAGNOSIS — R29818 Other symptoms and signs involving the nervous system: Secondary | ICD-10-CM | POA: Diagnosis not present

## 2023-12-09 DIAGNOSIS — R278 Other lack of coordination: Secondary | ICD-10-CM

## 2023-12-09 DIAGNOSIS — M62838 Other muscle spasm: Secondary | ICD-10-CM
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
## 2023-12-10 DIAGNOSIS — M47812 Spondylosis without myelopathy or radiculopathy, cervical region: Secondary | ICD-10-CM | POA: Diagnosis not present

## 2023-12-10 DIAGNOSIS — M542 Cervicalgia: Secondary | ICD-10-CM | POA: Diagnosis not present

## 2023-12-10 DIAGNOSIS — M25512 Pain in left shoulder: Secondary | ICD-10-CM | POA: Diagnosis not present

## 2023-12-11 ENCOUNTER — Ambulatory Visit: Payer: Medicare PPO | Admitting: Occupational Therapy

## 2023-12-11 ENCOUNTER — Encounter: Payer: Self-pay | Admitting: Occupational Therapy

## 2023-12-11 DIAGNOSIS — M6281 Muscle weakness (generalized): Secondary | ICD-10-CM | POA: Diagnosis not present

## 2023-12-11 DIAGNOSIS — M25641 Stiffness of right hand, not elsewhere classified: Secondary | ICD-10-CM | POA: Diagnosis not present

## 2023-12-11 DIAGNOSIS — R29818 Other symptoms and signs involving the nervous system: Secondary | ICD-10-CM

## 2023-12-11 DIAGNOSIS — R278 Other lack of coordination: Secondary | ICD-10-CM

## 2023-12-11 DIAGNOSIS — R2689 Other abnormalities of gait and mobility: Secondary | ICD-10-CM | POA: Diagnosis not present

## 2023-12-11 DIAGNOSIS — R29898 Other symptoms and signs involving the musculoskeletal system: Secondary | ICD-10-CM

## 2023-12-11 DIAGNOSIS — M62838 Other muscle spasm: Secondary | ICD-10-CM | POA: Diagnosis not present

## 2023-12-11 NOTE — Patient Instructions (Signed)
Ways to prevent future Parkinson's related complications:  1.   Exercise regularly/daily.    2.   Focus on BIGGER movements during daily activities- really reach overhead, straighten elbows and extend fingers  3.   When dressing (especially jacket/coat) or reaching for your seatbelt make sure to use your body to assist by twisting while you reach and looking at where you are reaching - this can help to minimize stress on the shoulder and reduce the risk of a rotator cuff tear  4.   Swing your arms when you walk (unless using walker)! People with PD are at increased risk for frozen shoulder and swinging your arms can reduce this risk.  5. Drink plenty of water and eat a high fiber diet (fresh fruits and veggies, nuts/seeds, fish). Avoid canned foods, red meats, and dairy when possible  6. Do NOT take your Parkinson's medication with meals (take at least 30 min before a meal, or 1 hour after a meal), especially protein as it blocks the absorption of the medicine and will not work effectively  7. Keep your feet apart when you are standing (wider stance) to allow you to have better balance and to reach further with your arms. Also make sure your feet are apart before standing up.
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
## 2023-12-16 ENCOUNTER — Ambulatory Visit: Payer: Medicare PPO | Admitting: Physical Therapy

## 2023-12-16 ENCOUNTER — Ambulatory Visit: Payer: Medicare PPO | Admitting: Occupational Therapy

## 2023-12-16 DIAGNOSIS — M6281 Muscle weakness (generalized): Secondary | ICD-10-CM

## 2023-12-16 DIAGNOSIS — R278 Other lack of coordination: Secondary | ICD-10-CM

## 2023-12-16 DIAGNOSIS — M62838 Other muscle spasm: Secondary | ICD-10-CM | POA: Diagnosis not present

## 2023-12-16 DIAGNOSIS — R2689 Other abnormalities of gait and mobility: Secondary | ICD-10-CM

## 2023-12-16 DIAGNOSIS — R29898 Other symptoms and signs involving the musculoskeletal system: Secondary | ICD-10-CM

## 2023-12-16 DIAGNOSIS — M25641 Stiffness of right hand, not elsewhere classified: Secondary | ICD-10-CM

## 2023-12-16 DIAGNOSIS — R29818 Other symptoms and signs involving the nervous system: Secondary | ICD-10-CM
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
## 2023-12-16 NOTE — Patient Instructions (Signed)
  Bag Exercises:  Small trash bag or produce bag works best.  For all exercises, sit with big posture (sit up tall with head up) and use big movements. Perform the following exercises 1 times per day.  Hold end of bag in one hand. Stretch fingers out big to draw the entire bag into your palm. Repeat 3 times with each hand. Hold bag in one hand. Stretch both arms/hands out to the side as big as you can. Then, pass bag from one hand to the other BEHIND you. Stretch arms back out big after each pass. Repeat 10 times. Hold bag in both hands in front of you with hands/arms shoulder length apart. Lift leg and move bag completely under each foot and back. Repeat 10 times on each side. Hold bag in right hand. Move right hand to reach behind shoulder. Then, reach behind back with left hand to pass bag from right hand to left hand. Switch sides. Repeat 10 times on each side.

## 2023-12-22 ENCOUNTER — Ambulatory Visit: Payer: Medicare PPO | Admitting: Occupational Therapy
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
## 2023-12-23 ENCOUNTER — Encounter: Payer: Self-pay | Admitting: Physical Therapy

## 2023-12-23 ENCOUNTER — Encounter: Payer: Self-pay | Admitting: Occupational Therapy

## 2023-12-23 ENCOUNTER — Ambulatory Visit: Payer: Medicare PPO | Admitting: Occupational Therapy

## 2023-12-23 ENCOUNTER — Ambulatory Visit: Payer: Medicare PPO | Admitting: Physical Therapy

## 2023-12-23 DIAGNOSIS — R29898 Other symptoms and signs involving the musculoskeletal system: Secondary | ICD-10-CM | POA: Diagnosis not present

## 2023-12-23 DIAGNOSIS — M25641 Stiffness of right hand, not elsewhere classified: Secondary | ICD-10-CM

## 2023-12-23 DIAGNOSIS — R278 Other lack of coordination: Secondary | ICD-10-CM | POA: Diagnosis not present

## 2023-12-23 DIAGNOSIS — R2689 Other abnormalities of gait and mobility: Secondary | ICD-10-CM

## 2023-12-23 DIAGNOSIS — R29818 Other symptoms and signs involving the nervous system: Secondary | ICD-10-CM

## 2023-12-23 DIAGNOSIS — M62838 Other muscle spasm: Secondary | ICD-10-CM | POA: Diagnosis not present

## 2023-12-23 DIAGNOSIS — M6281 Muscle weakness (generalized): Secondary | ICD-10-CM | POA: Diagnosis not present
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
## 2023-12-25 ENCOUNTER — Ambulatory Visit: Payer: Medicare PPO | Admitting: Occupational Therapy

## 2023-12-30 ENCOUNTER — Ambulatory Visit: Payer: Medicare PPO | Admitting: Occupational Therapy

## 2024-01-01 ENCOUNTER — Ambulatory Visit: Payer: Medicare PPO | Admitting: Occupational Therapy

## 2024-01-06 ENCOUNTER — Ambulatory Visit: Payer: Medicare PPO | Attending: Neurology | Admitting: Occupational Therapy

## 2024-01-06 DIAGNOSIS — R29818 Other symptoms and signs involving the nervous system: Secondary | ICD-10-CM

## 2024-01-06 DIAGNOSIS — M25641 Stiffness of right hand, not elsewhere classified: Secondary | ICD-10-CM

## 2024-01-06 DIAGNOSIS — R29898 Other symptoms and signs involving the musculoskeletal system: Secondary | ICD-10-CM | POA: Diagnosis not present

## 2024-01-06 DIAGNOSIS — M6281 Muscle weakness (generalized): Secondary | ICD-10-CM | POA: Diagnosis not present

## 2024-01-06 DIAGNOSIS — R2689 Other abnormalities of gait and mobility: Secondary | ICD-10-CM | POA: Insufficient documentation

## 2024-01-06 DIAGNOSIS — R278 Other lack of coordination: Secondary | ICD-10-CM | POA: Diagnosis not present
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
## 2024-01-08 ENCOUNTER — Ambulatory Visit: Payer: Medicare PPO | Admitting: Occupational Therapy

## 2024-01-08 DIAGNOSIS — R29818 Other symptoms and signs involving the nervous system: Secondary | ICD-10-CM

## 2024-01-08 DIAGNOSIS — R278 Other lack of coordination: Secondary | ICD-10-CM | POA: Diagnosis not present

## 2024-01-08 DIAGNOSIS — R29898 Other symptoms and signs involving the musculoskeletal system: Secondary | ICD-10-CM | POA: Diagnosis not present

## 2024-01-08 DIAGNOSIS — M25641 Stiffness of right hand, not elsewhere classified: Secondary | ICD-10-CM | POA: Diagnosis not present

## 2024-01-08 DIAGNOSIS — R2689 Other abnormalities of gait and mobility: Secondary | ICD-10-CM | POA: Diagnosis not present

## 2024-01-08 DIAGNOSIS — M6281 Muscle weakness (generalized): Secondary | ICD-10-CM | POA: Diagnosis not present
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
## 2024-01-12 ENCOUNTER — Ambulatory Visit: Payer: Medicare PPO | Admitting: Occupational Therapy

## 2024-01-12 DIAGNOSIS — R278 Other lack of coordination: Secondary | ICD-10-CM | POA: Diagnosis not present

## 2024-01-12 DIAGNOSIS — M25641 Stiffness of right hand, not elsewhere classified: Secondary | ICD-10-CM

## 2024-01-12 DIAGNOSIS — R29898 Other symptoms and signs involving the musculoskeletal system: Secondary | ICD-10-CM | POA: Diagnosis not present

## 2024-01-12 DIAGNOSIS — R2689 Other abnormalities of gait and mobility: Secondary | ICD-10-CM | POA: Diagnosis not present

## 2024-01-12 DIAGNOSIS — R29818 Other symptoms and signs involving the nervous system: Secondary | ICD-10-CM | POA: Diagnosis not present

## 2024-01-12 DIAGNOSIS — M6281 Muscle weakness (generalized): Secondary | ICD-10-CM | POA: Diagnosis not present
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
## 2024-01-14 ENCOUNTER — Encounter: Payer: Self-pay | Admitting: Occupational Therapy

## 2024-01-14 ENCOUNTER — Ambulatory Visit: Payer: Medicare PPO | Admitting: Occupational Therapy

## 2024-01-14 DIAGNOSIS — R29818 Other symptoms and signs involving the nervous system: Secondary | ICD-10-CM | POA: Diagnosis not present

## 2024-01-14 DIAGNOSIS — R29898 Other symptoms and signs involving the musculoskeletal system: Secondary | ICD-10-CM | POA: Diagnosis not present

## 2024-01-14 DIAGNOSIS — M6281 Muscle weakness (generalized): Secondary | ICD-10-CM

## 2024-01-14 DIAGNOSIS — R2689 Other abnormalities of gait and mobility: Secondary | ICD-10-CM

## 2024-01-14 DIAGNOSIS — R278 Other lack of coordination: Secondary | ICD-10-CM

## 2024-01-14 DIAGNOSIS — M25641 Stiffness of right hand, not elsewhere classified: Secondary | ICD-10-CM
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
## 2024-01-19 ENCOUNTER — Ambulatory Visit: Payer: Medicare PPO | Admitting: Occupational Therapy

## 2024-01-19 DIAGNOSIS — R278 Other lack of coordination: Secondary | ICD-10-CM | POA: Diagnosis not present

## 2024-01-19 DIAGNOSIS — R29898 Other symptoms and signs involving the musculoskeletal system: Secondary | ICD-10-CM

## 2024-01-19 DIAGNOSIS — R2689 Other abnormalities of gait and mobility: Secondary | ICD-10-CM | POA: Diagnosis not present

## 2024-01-19 DIAGNOSIS — M6281 Muscle weakness (generalized): Secondary | ICD-10-CM

## 2024-01-19 DIAGNOSIS — M25641 Stiffness of right hand, not elsewhere classified: Secondary | ICD-10-CM

## 2024-01-19 DIAGNOSIS — R29818 Other symptoms and signs involving the nervous system: Secondary | ICD-10-CM
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
## 2024-01-21 ENCOUNTER — Ambulatory Visit: Payer: Medicare PPO | Admitting: Occupational Therapy

## 2024-01-21 DIAGNOSIS — R2689 Other abnormalities of gait and mobility: Secondary | ICD-10-CM | POA: Diagnosis not present

## 2024-01-21 DIAGNOSIS — M6281 Muscle weakness (generalized): Secondary | ICD-10-CM | POA: Diagnosis not present

## 2024-01-21 DIAGNOSIS — R29818 Other symptoms and signs involving the nervous system: Secondary | ICD-10-CM

## 2024-01-21 DIAGNOSIS — R29898 Other symptoms and signs involving the musculoskeletal system: Secondary | ICD-10-CM

## 2024-01-21 DIAGNOSIS — M25641 Stiffness of right hand, not elsewhere classified: Secondary | ICD-10-CM | POA: Diagnosis not present

## 2024-01-21 DIAGNOSIS — R278 Other lack of coordination: Secondary | ICD-10-CM | POA: Diagnosis not present
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
## 2024-01-21 NOTE — Patient Instructions (Signed)
(  Exercise) Monday Tuesday Wednesday Thursday Friday Saturday Sunday   PWR!seated           PWR! standing           PWR! hands daily           coordination exercises 3x week           PT stretches 3x week          walking 5x week

## 2024-02-26 DIAGNOSIS — M79641 Pain in right hand: Secondary | ICD-10-CM | POA: Diagnosis not present

## 2024-02-26 DIAGNOSIS — M65342 Trigger finger, left ring finger: Secondary | ICD-10-CM | POA: Diagnosis not present

## 2024-02-26 DIAGNOSIS — R7989 Other specified abnormal findings of blood chemistry: Secondary | ICD-10-CM | POA: Diagnosis not present

## 2024-02-26 DIAGNOSIS — E663 Overweight: Secondary | ICD-10-CM | POA: Diagnosis not present

## 2024-02-26 DIAGNOSIS — M0579 Rheumatoid arthritis with rheumatoid factor of multiple sites without organ or systems involvement: Secondary | ICD-10-CM | POA: Diagnosis not present

## 2024-02-26 DIAGNOSIS — M542 Cervicalgia: Secondary | ICD-10-CM | POA: Diagnosis not present

## 2024-02-26 DIAGNOSIS — M7061 Trochanteric bursitis, right hip: Secondary | ICD-10-CM | POA: Diagnosis not present

## 2024-02-26 DIAGNOSIS — Z6829 Body mass index (BMI) 29.0-29.9, adult: Secondary | ICD-10-CM | POA: Diagnosis not present

## 2024-04-02 ENCOUNTER — Encounter: Payer: Self-pay | Admitting: Neurology

## 2024-04-05 ENCOUNTER — Other Ambulatory Visit

## 2024-04-05 ENCOUNTER — Encounter: Payer: Self-pay | Admitting: Neurology

## 2024-04-08 DIAGNOSIS — F419 Anxiety disorder, unspecified: Secondary | ICD-10-CM | POA: Diagnosis not present

## 2024-04-08 DIAGNOSIS — Z125 Encounter for screening for malignant neoplasm of prostate: Secondary | ICD-10-CM | POA: Diagnosis not present

## 2024-04-08 DIAGNOSIS — E7800 Pure hypercholesterolemia, unspecified: Secondary | ICD-10-CM | POA: Diagnosis not present

## 2024-04-08 DIAGNOSIS — E790 Hyperuricemia without signs of inflammatory arthritis and tophaceous disease: Secondary | ICD-10-CM | POA: Diagnosis not present

## 2024-04-08 DIAGNOSIS — I10 Essential (primary) hypertension: Secondary | ICD-10-CM | POA: Diagnosis not present

## 2024-04-08 DIAGNOSIS — R739 Hyperglycemia, unspecified: Secondary | ICD-10-CM | POA: Diagnosis not present

## 2024-04-08 DIAGNOSIS — E559 Vitamin D deficiency, unspecified: Secondary | ICD-10-CM | POA: Diagnosis not present

## 2024-04-12 ENCOUNTER — Encounter: Payer: Self-pay | Admitting: Neurology

## 2024-04-13 ENCOUNTER — Other Ambulatory Visit: Payer: Medicare PPO

## 2024-04-13 ENCOUNTER — Ambulatory Visit: Payer: Medicare PPO | Admitting: Neurology

## 2024-04-18 ENCOUNTER — Ambulatory Visit
Admission: RE | Admit: 2024-04-18 | Discharge: 2024-04-18 | Disposition: A | Discharge status: Home / Self Care | Source: Ambulatory Visit | Attending: Neurology | Admitting: Neurology

## 2024-04-18 DIAGNOSIS — R9082 White matter disease, unspecified: Secondary | ICD-10-CM

## 2024-04-18 DIAGNOSIS — G20C Parkinsonism, unspecified: Secondary | ICD-10-CM

## 2024-04-18 MED ORDER — GADOPICLENOL 0.5 MMOL/ML IV SOLN
9.0000 mL | Freq: Once | INTRAVENOUS | Status: AC | PRN
  Administered 2024-04-18: 9 mL via INTRAVENOUS

## 2024-04-22 DIAGNOSIS — I10 Essential (primary) hypertension: Secondary | ICD-10-CM | POA: Diagnosis not present

## 2024-04-22 DIAGNOSIS — E559 Vitamin D deficiency, unspecified: Secondary | ICD-10-CM | POA: Diagnosis not present

## 2024-04-22 DIAGNOSIS — Z0000 Encounter for general adult medical examination without abnormal findings: Secondary | ICD-10-CM | POA: Diagnosis not present

## 2024-04-30 ENCOUNTER — Encounter: Payer: Self-pay | Admitting: Neurology

## 2024-05-03 ENCOUNTER — Encounter: Payer: Self-pay | Admitting: Neurology

## 2024-05-04 ENCOUNTER — Encounter: Payer: Self-pay | Admitting: Neurology

## 2024-05-04 ENCOUNTER — Ambulatory Visit: Admitting: Neurology

## 2024-05-04 VITALS — BP 117/67 | HR 91 | Ht 67.5 in | Wt 195.0 lb

## 2024-05-04 DIAGNOSIS — R9082 White matter disease, unspecified: Secondary | ICD-10-CM | POA: Diagnosis not present

## 2024-05-04 DIAGNOSIS — G20C Parkinsonism, unspecified: Secondary | ICD-10-CM | POA: Diagnosis not present

## 2024-05-04 MED ORDER — CARBIDOPA-LEVODOPA 25-100 MG PO TABS: 1.5000 | ORAL_TABLET | Freq: Three times a day (TID) | ORAL | 1 refills | Status: DC

## 2024-05-04 NOTE — Progress Notes (Signed)
 Follow-up Visit   Date: 05/04/2024    Nathaniel [name] MRN: [id number] DOB: August 27, 1951    Nathaniel [name] is a 73 y.o. right-handed Caucasian male with RA, hypertension, GERD, and OSA returning to the clinic for follow-up of right side weakness.  The patient was accompanied to the clinic by wife who also provides collateral information.    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  Confirmed by DAT scan.    - Increase sinemet  25/100 1.5 tablets three times daily   - Continue PT and OT  2.   White matter abnormality on MRI brain.  Stable.  Imaging shows stable nonenhancing confluent white matter abnormality over the right posterior parietal region.  He is asymptomatic.  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  CSF testing was normal. He has been off methotrexate.  Repeat MRI brain is stable without any progression.  I will recheck MRI brain wwo contrast in 6 months, if this is stable, repeat in 1 year.  Return to clinic in 6 months  --------------------------------------------- History of present illness: Starting in 2021, he began noticing weakness involving the right arm and leg.  He also feels that the right hand does not have dexterity, so has started to use his left hand.  He endorses stiffness in the hands. He struggles with fine motor tasks such as tying shoes or writing with the right hand.  He walks with a limp.  He has not had any falls and walks unassisted.  He denies changes in smell or constipation.     He also complains of localized pain over the right upper arm.  Pain is achy and can sometimes radiate down the arm. 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  He saw Dr. Vaughn Georges who offered ESI and PT.   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   He lives at  home with wife.     UPDATE 07/23/2023:  He is here to discuss the results of DAT scan and MRI brain.  DAT scan was consistent with parkinson's disease.  MRI brain showed white matter change over the right parietal region, which is nonenhancing.  He denies any left sided weakness or sensory changes.  No problems with vision or language. His right arm continues to be stiff and he struggles with fine motor tasks.  His gait is stiff and unchanged.   UPDATE 10/15/2023:  He is here for follow-up visit.  He has been compliant with taking sinemet  three times daily, but wife has noticed that he becomes easily agitated and irritable on the medication.  He has not noticed a marked change in his right arm stiffness.  He feels that PT is helping with staying active and his balance.  He had MRI brain which is stable.  No new complaints.   UPDATE 05/04/2024:  He is here for follow-up visit.  His agitation and mood has been doing better.  He is taking sinemet  25/100 1.5 tablets three times daily.  He completed OT and felt that it helped his fine motor tasks.  He reports having a dull headache with sinemet  and takes tylenol  as needed. No new neurological complaints.   Medications:  Current Outpatient Medications on File Prior to Visit  Medication Sig Dispense Refill   Abatacept (ORENCIA CLICKJECT) 125 MG/ML SOAJ Inject 125 mg/mL into the  skin once a week.     acetaminophen  (TYLENOL ) 500 MG tablet Take 1,000 mg by mouth every 6 (six) hours as needed for moderate pain (pain score 4-6).     Ascorbic Acid (VITAMIN C) 100 MG tablet Take 100 mg by mouth daily.     carbidopa -levodopa  (SINEMET  IR) 25-100 MG tablet Take 1 tablet by mouth 3 (three) times daily. 90 tablet 5   cholecalciferol  (VITAMIN D ) 1000 UNITS tablet Take 1,000 Units by mouth daily after breakfast.      Coenzyme Q10 (CO Q-10) 100 MG CAPS Take 100 mg by mouth daily.     cyclobenzaprine  (FLEXERIL ) 5 MG tablet Take 1 tablet (5 mg total) by mouth at bedtime as  needed for muscle spasms. 30 tablet 1   folic acid  (FOLVITE ) 1 MG tablet Take 1 mg by mouth daily after breakfast.      losartan  (COZAAR ) 100 MG tablet Take 50 mg by mouth daily after breakfast. Half a tablet daily     meloxicam (MOBIC) 15 MG tablet Take 15 mg by mouth daily as needed for pain (for pain). PRN for 30 days     vitamin E  400 UNIT capsule Take 400 Units by mouth daily after breakfast.     Adalimumab ([name] PEN [place]) Inject 0.4 mLs into the skin. 0.4 subcutaneous every 2 weeks for 30 days. (Patient not taking: Reported on 08/06/2023)     diazepam  (VALIUM ) 5 MG tablet Take 30-min prior to MRI. (Patient not taking: Reported on 05/04/2024) 1 tablet 0   diclofenac  Sodium (VOLTAREN ) 1 % GEL Apply 2 g topically 3 (three) times daily as needed (arthritis). (Patient not taking: Reported on 05/04/2024)     METHOTREXATE PO Take 2.5 mg by mouth. 4 tablets by mouth one time weekly (Patient not taking: Reported on 08/06/2023)     traMADol  (ULTRAM ) 50 MG tablet Take 1-2 tablets (50-100 mg total) by mouth every 6 (six) hours as needed for moderate pain or severe pain. (Patient not taking: Reported on 08/06/2023) 20 tablet 0   No current facility-administered medications on file prior to visit.    Allergies: No Known Allergies  Vital Signs:  BP 117/67   Pulse 91   Ht 5' 7.5" (1.715 m)   Wt 195 lb (88.5 kg)   SpO2 97%   BMI 30.09 kg/m   Neurological Exam: MENTAL STATUS including orientation to time, place, person, recent and remote memory, attention span and concentration, language, and fund of knowledge is normal.  Speech is not dysarthric. Blunted affect. Reduced blink.   CRANIAL NERVES:  Pupils equal round and reactive to light.  Normal conjugate, extra-ocular eye movements in all directions of gaze.  No ptosis.  Face is symmetric.   MOTOR:  Cogwheel rigidity of the right upper and lower extremity.  Tone in the left arm is normal.  Motor strength is 5/5 throughout.  MSRs:  Reflexes are 2+/4  throughout.  SENSORY:  Intact to vibration throughout.  COORDINATION/GAIT:  Normal finger-to- nose-finger.  Markedly slowed finger tapping on the right, slightly improved from before. Left finger tapping intact.  Toe and heel tapping is slowed on the right compared to the left. Gait appears slightly unsteady with rigidity in the right leg causing mild circumduction, poor arm swing on the left  Data: DAT scan 07/09/2023: Reduced bilateral putamen striatal Ioflupane activity as above. This pattern can be seen in Parkinsonian syndromes.  MRI brain wo contrast 06/30/2023: Abnormal T2 hyperintensity predominantly involving right parieto-occipital white matter, indeterminate.  Considerations include infection (progressive multifocal leukoencephalopathy, encephalitis), neoplasm/glioma, demyelinating disease, and other inflammatory processes. Postcontrast brain MRI is recommended for further evaluation.   MRI brain with contrast 07/13/2023: No enhancement of the stable confluent abnormal signal in the right parietal lobe, and no associated regional mass effect. No abnormal enhancement elsewhere.   This remains indeterminate, and although chronic nonspecific gliosis is possible a Low Grade Glioma is not excluded.  MRI Brain 09/30/2023: Unchanged area of confluent subcortical T2 hyperintense signal centered within the right parietal lobe and right occipital periventricular white matter. Given lack of cortical involvement, mass effect, and/or volume loss, this is favored to reflect sequela of prior inflammation or demyelination. Low-grade neoplasm remains a differential consideration.  CSF 08/07/2023:  R1 W1 P52 G59, JCV negative.    Thank you for allowing me to participate in patient's care.  If I can answer any additional questions, I would be pleased to do so.    Sincerely,    Layaan Mott K. Lydia Sams, DO

## 2024-05-04 NOTE — Patient Instructions (Addendum)
 MRI brain wwo contrast in December  Increase sinemet  25/100 to 1.5 tablets three times daily

## 2024-05-19 ENCOUNTER — Ambulatory Visit: Payer: Medicare PPO | Admitting: Occupational Therapy

## 2024-06-03 NOTE — Therapy (Signed)
 Occupational Therapy Parkinson's Disease Screen  Hand dominance:  right     Physical Performance Test item #4 (donning/doffing jacket):  17.23 sec  Fastening/unfastening 3 buttons in:  1 min  9-hole peg test:    RUE  25.32 sec        LUE  27.25 sec  Box & Blocks Test:   RUE  59 blocks        LUE  63 blocks   Change in ability to perform ADLs/IADLs:  Pt rpeorts using his RUE more and better for shaving, eating and writing. Pt reports that MD increased his medication dosage and he feels better. Pt writes legibly with min decrease in letter size.    Pt does not require occupational therapy services at this time.  Recommended occupational therapy screen in   4 months, pt plans to return in October.  Lamarr Brought, OTR/L  10:44 AM 06/10/24

## 2024-06-10 ENCOUNTER — Ambulatory Visit: Attending: Registered Nurse | Admitting: Occupational Therapy

## 2024-06-10 DIAGNOSIS — R29818 Other symptoms and signs involving the nervous system: Secondary | ICD-10-CM | POA: Insufficient documentation

## 2024-06-10 DIAGNOSIS — R2689 Other abnormalities of gait and mobility: Secondary | ICD-10-CM | POA: Insufficient documentation
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
## 2024-06-21 ENCOUNTER — Ambulatory Visit: Payer: Medicare PPO | Admitting: Physical Therapy

## 2024-06-21 DIAGNOSIS — R2689 Other abnormalities of gait and mobility: Secondary | ICD-10-CM

## 2024-07-08 DIAGNOSIS — B079 Viral wart, unspecified: Secondary | ICD-10-CM | POA: Diagnosis not present

## 2024-07-08 DIAGNOSIS — L570 Actinic keratosis: Secondary | ICD-10-CM | POA: Diagnosis not present

## 2024-07-08 DIAGNOSIS — D492 Neoplasm of unspecified behavior of bone, soft tissue, and skin: Secondary | ICD-10-CM | POA: Diagnosis not present

## 2024-08-02 DIAGNOSIS — L821 Other seborrheic keratosis: Secondary | ICD-10-CM | POA: Diagnosis not present

## 2024-08-02 DIAGNOSIS — D225 Melanocytic nevi of trunk: Secondary | ICD-10-CM | POA: Diagnosis not present

## 2024-08-02 DIAGNOSIS — Z85820 Personal history of malignant melanoma of skin: Secondary | ICD-10-CM | POA: Diagnosis not present

## 2024-08-02 DIAGNOSIS — L814 Other melanin hyperpigmentation: Secondary | ICD-10-CM | POA: Diagnosis not present

## 2024-08-02 DIAGNOSIS — L538 Other specified erythematous conditions: Secondary | ICD-10-CM | POA: Diagnosis not present

## 2024-08-02 DIAGNOSIS — D492 Neoplasm of unspecified behavior of bone, soft tissue, and skin: Secondary | ICD-10-CM | POA: Diagnosis not present

## 2024-08-02 DIAGNOSIS — D2271 Melanocytic nevi of right lower limb, including hip: Secondary | ICD-10-CM | POA: Diagnosis not present

## 2024-08-26 DIAGNOSIS — M542 Cervicalgia: Secondary | ICD-10-CM | POA: Diagnosis not present

## 2024-08-26 DIAGNOSIS — R7989 Other specified abnormal findings of blood chemistry: Secondary | ICD-10-CM | POA: Diagnosis not present

## 2024-08-26 DIAGNOSIS — M0579 Rheumatoid arthritis with rheumatoid factor of multiple sites without organ or systems involvement: Secondary | ICD-10-CM | POA: Diagnosis not present

## 2024-08-26 DIAGNOSIS — Z6830 Body mass index (BMI) 30.0-30.9, adult: Secondary | ICD-10-CM | POA: Diagnosis not present

## 2024-08-26 DIAGNOSIS — M7061 Trochanteric bursitis, right hip: Secondary | ICD-10-CM | POA: Diagnosis not present

## 2024-08-26 DIAGNOSIS — E669 Obesity, unspecified: Secondary | ICD-10-CM | POA: Diagnosis not present

## 2024-08-26 DIAGNOSIS — M65342 Trigger finger, left ring finger: Secondary | ICD-10-CM | POA: Diagnosis not present

## 2024-08-26 DIAGNOSIS — Z111 Encounter for screening for respiratory tuberculosis: Secondary | ICD-10-CM | POA: Diagnosis not present

## 2024-08-26 DIAGNOSIS — M79641 Pain in right hand: Secondary | ICD-10-CM | POA: Diagnosis not present

## 2024-08-30 ENCOUNTER — Ambulatory Visit: Attending: Registered Nurse | Admitting: Occupational Therapy

## 2024-08-30 DIAGNOSIS — R278 Other lack of coordination: Secondary | ICD-10-CM | POA: Insufficient documentation

## 2024-08-30 DIAGNOSIS — M25641 Stiffness of right hand, not elsewhere classified: Secondary | ICD-10-CM | POA: Insufficient documentation

## 2024-08-30 NOTE — Therapy (Signed)
 Occupational Therapy Parkinson's Disease Screen  Hand dominance:  right   Physical Performance Test item #2 (simulated eating):  14.74 sec  Fastening/unfastening 3 buttons in:  51.44sec  9-hole peg test:    RUE  29.85 sec        LUE  24.35 sec  Box & Blocks Test:   RUE  63 blocks        LUE  63 blocks   Change in ability to perform ADLs/IADLs:  difficulty with: putting key in lock, zipping pants, shaving, opening bottles, using tools,  difficulty pulling lawnmower cord  Other Comments:  handwriting is legibile and good letter size.  Pt would benefit from occupational therapy evaluation due to: change in ADLS and fine motor coordination difficulties with RUE.  Nathaniel [name], OTR/L  10:34 AM 08/30/24

## 2024-09-15 ENCOUNTER — Telehealth: Payer: Self-pay | Admitting: Occupational Therapy

## 2024-09-15 DIAGNOSIS — R2689 Other abnormalities of gait and mobility: Secondary | ICD-10-CM

## 2024-09-15 DIAGNOSIS — R278 Other lack of coordination: Secondary | ICD-10-CM

## 2024-09-15 DIAGNOSIS — R29818 Other symptoms and signs involving the nervous system: Secondary | ICD-10-CM

## 2024-09-15 DIAGNOSIS — M6281 Muscle weakness (generalized): Secondary | ICD-10-CM

## 2024-09-15 DIAGNOSIS — R29898 Other symptoms and signs involving the musculoskeletal system: Secondary | ICD-10-CM

## 2024-09-15 NOTE — Telephone Encounter (Signed)
 Dr. Tobie,  Norleen Saxon was seen for a Parkinson's screen.  He demonstrates a decline in RUE functional use and coordination.  He can benefit from an occupational therapy evaluation.  If you agree please place a referral for occupational therapy to:  [facility] [address] [facility]. [place], [place], [address] Phone: [phone]   Fax:  [phone]  Best regards, Lamarr Brought, OTR/L

## 2024-09-20 ENCOUNTER — Telehealth: Payer: Self-pay | Admitting: Occupational Therapy

## 2024-09-20 NOTE — Telephone Encounter (Signed)
 Nathaniel [name]

## 2024-09-20 NOTE — Addendum Note (Signed)
 Addended by: DASIE RHODY A on: 09/20/2024 08:47 AM   Modules accepted: Orders
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
## 2024-10-07 ENCOUNTER — Other Ambulatory Visit: Payer: Self-pay

## 2024-10-07 ENCOUNTER — Encounter: Payer: Self-pay | Admitting: Occupational Therapy

## 2024-10-07 ENCOUNTER — Ambulatory Visit: Attending: Neurology | Admitting: Occupational Therapy

## 2024-10-07 DIAGNOSIS — M25641 Stiffness of right hand, not elsewhere classified: Secondary | ICD-10-CM | POA: Insufficient documentation

## 2024-10-07 DIAGNOSIS — M6281 Muscle weakness (generalized): Secondary | ICD-10-CM | POA: Diagnosis not present

## 2024-10-07 DIAGNOSIS — R278 Other lack of coordination: Secondary | ICD-10-CM | POA: Diagnosis not present

## 2024-10-07 DIAGNOSIS — R29818 Other symptoms and signs involving the nervous system: Secondary | ICD-10-CM | POA: Insufficient documentation

## 2024-10-07 DIAGNOSIS — R29898 Other symptoms and signs involving the musculoskeletal system: Secondary | ICD-10-CM | POA: Diagnosis not present

## 2024-10-07 DIAGNOSIS — R2689 Other abnormalities of gait and mobility: Secondary | ICD-10-CM | POA: Insufficient documentation

## 2024-10-07 DIAGNOSIS — M25621 Stiffness of right elbow, not elsewhere classified: Secondary | ICD-10-CM | POA: Insufficient documentation
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
## 2024-10-15 ENCOUNTER — Encounter: Payer: Self-pay | Admitting: Occupational Therapy

## 2024-10-15 ENCOUNTER — Ambulatory Visit: Admitting: Occupational Therapy

## 2024-10-15 DIAGNOSIS — M25621 Stiffness of right elbow, not elsewhere classified: Secondary | ICD-10-CM

## 2024-10-15 DIAGNOSIS — M25641 Stiffness of right hand, not elsewhere classified: Secondary | ICD-10-CM

## 2024-10-15 DIAGNOSIS — R29898 Other symptoms and signs involving the musculoskeletal system: Secondary | ICD-10-CM

## 2024-10-15 DIAGNOSIS — M6281 Muscle weakness (generalized): Secondary | ICD-10-CM | POA: Diagnosis not present

## 2024-10-15 DIAGNOSIS — R29818 Other symptoms and signs involving the nervous system: Secondary | ICD-10-CM | POA: Diagnosis not present

## 2024-10-15 DIAGNOSIS — R278 Other lack of coordination: Secondary | ICD-10-CM

## 2024-10-15 DIAGNOSIS — R2689 Other abnormalities of gait and mobility: Secondary | ICD-10-CM | POA: Diagnosis not present

## 2024-10-15 NOTE — Patient Instructions (Addendum)
 PWR! Hand Exercises  Then, start with elbows bent and hands closed:  PWR! Hands: Push hands out BIG. Elbows straight, wrists up, fingers open and spread apart BIG. (Can also perform by pushing down on table, chair, knees. Push above head, out to the side, behind you, in front of you.)  PWR! Step: Touch index finger to thumb while keeping other fingers straight. Flick fingers out BIG (thumb out/straighten fingers). Repeat with other fingers. (Step your thumb to each finger).   With arms stretched out in front of you (elbows straight), perform the following:  PWR! Rock:  Move wrists up and down [employer]! Twist: Twist palms up and down BIG  ** Make each movement big and deliberate so that you feel the movement.      Coordination Exercises  Perform the following exercises for 20 minutes 1 times per day. Perform with both hand(s). Perform using big movements.  Flipping Cards: Place deck of cards on the table. Flip cards over by opening your hand big to grasp and then turn your palm up big, opening hand fully to release. Deal cards: Hold 1/2 or whole deck in your hand. Use thumb to push card off top of deck with one big push. Rotate ball with fingertips: Pick up with fingers/thumb and move as much as you can with each turn/movement (clockwise and counter-clockwise). Toss ball in the air and catch with the same hand: Toss big/high.  Deliberately open with toss and deliberately close hand after catch. Rotate 2 golf balls in your hand:  Pick up 5-10 coins one at a time and hold in palm. Then, move coins from palm to fingertips one at a time to stack. Practice writing: Slow down, write big, and focus on forming each letter.  Stop and stretch hand frequently Typing Fasten nuts/bolts or put on bottle caps: Turn as much/as big as you can with each turn.  Move wrist.  Hold Left hand still Wash hands, hold left hand still and use right hand to clean.

## 2024-10-18 ENCOUNTER — Encounter: Payer: Self-pay | Admitting: Neurology

## 2024-10-19 ENCOUNTER — Ambulatory Visit: Admitting: Occupational Therapy

## 2024-10-19 DIAGNOSIS — R29898 Other symptoms and signs involving the musculoskeletal system: Secondary | ICD-10-CM | POA: Diagnosis not present

## 2024-10-19 DIAGNOSIS — R278 Other lack of coordination: Secondary | ICD-10-CM

## 2024-10-19 DIAGNOSIS — M6281 Muscle weakness (generalized): Secondary | ICD-10-CM

## 2024-10-19 DIAGNOSIS — M25641 Stiffness of right hand, not elsewhere classified: Secondary | ICD-10-CM

## 2024-10-19 DIAGNOSIS — M25621 Stiffness of right elbow, not elsewhere classified: Secondary | ICD-10-CM | POA: Diagnosis not present

## 2024-10-19 DIAGNOSIS — R2689 Other abnormalities of gait and mobility: Secondary | ICD-10-CM | POA: Diagnosis not present

## 2024-10-19 DIAGNOSIS — R29818 Other symptoms and signs involving the nervous system: Secondary | ICD-10-CM
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
## 2024-10-25 ENCOUNTER — Other Ambulatory Visit: Payer: Self-pay | Admitting: Neurology

## 2024-10-28 ENCOUNTER — Ambulatory Visit: Admitting: Occupational Therapy

## 2024-11-02 ENCOUNTER — Ambulatory Visit: Admitting: Occupational Therapy

## 2024-11-02 ENCOUNTER — Encounter: Payer: Self-pay | Admitting: Occupational Therapy

## 2024-11-02 DIAGNOSIS — R278 Other lack of coordination: Secondary | ICD-10-CM | POA: Diagnosis present

## 2024-11-02 DIAGNOSIS — R29818 Other symptoms and signs involving the nervous system: Secondary | ICD-10-CM | POA: Insufficient documentation

## 2024-11-02 DIAGNOSIS — R2689 Other abnormalities of gait and mobility: Secondary | ICD-10-CM | POA: Diagnosis present

## 2024-11-02 DIAGNOSIS — M6281 Muscle weakness (generalized): Secondary | ICD-10-CM | POA: Diagnosis present

## 2024-11-02 DIAGNOSIS — R29898 Other symptoms and signs involving the musculoskeletal system: Secondary | ICD-10-CM | POA: Insufficient documentation

## 2024-11-02 DIAGNOSIS — M25641 Stiffness of right hand, not elsewhere classified: Secondary | ICD-10-CM | POA: Insufficient documentation

## 2024-11-02 DIAGNOSIS — M25621 Stiffness of right elbow, not elsewhere classified: Secondary | ICD-10-CM | POA: Diagnosis present
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
## 2024-11-03 ENCOUNTER — Inpatient Hospital Stay: Admission: RE | Admit: 2024-11-03 | Discharge: 2024-11-03 | Attending: Neurology

## 2024-11-03 DIAGNOSIS — R9082 White matter disease, unspecified: Secondary | ICD-10-CM

## 2024-11-03 DIAGNOSIS — G20C Parkinsonism, unspecified: Secondary | ICD-10-CM

## 2024-11-03 MED ORDER — GADOPICLENOL 0.5 MMOL/ML IV SOLN
8.5000 mL | Freq: Once | INTRAVENOUS | Status: AC | PRN
  Administered 2024-11-03: 8.5 mL via INTRAVENOUS

## 2024-11-09 ENCOUNTER — Ambulatory Visit: Payer: Self-pay | Admitting: Neurology

## 2024-11-09 ENCOUNTER — Encounter: Payer: Self-pay | Admitting: Neurology

## 2024-11-09 ENCOUNTER — Encounter: Payer: Self-pay | Admitting: Occupational Therapy

## 2024-11-09 ENCOUNTER — Ambulatory Visit: Admitting: Occupational Therapy

## 2024-11-09 VITALS — BP 130/75 | HR 68 | Ht 67.5 in | Wt 201.0 lb

## 2024-11-09 DIAGNOSIS — R2689 Other abnormalities of gait and mobility: Secondary | ICD-10-CM

## 2024-11-09 DIAGNOSIS — M6281 Muscle weakness (generalized): Secondary | ICD-10-CM

## 2024-11-09 DIAGNOSIS — R9082 White matter disease, unspecified: Secondary | ICD-10-CM

## 2024-11-09 DIAGNOSIS — R29818 Other symptoms and signs involving the nervous system: Secondary | ICD-10-CM

## 2024-11-09 DIAGNOSIS — G20C Parkinsonism, unspecified: Secondary | ICD-10-CM

## 2024-11-09 DIAGNOSIS — R278 Other lack of coordination: Secondary | ICD-10-CM

## 2024-11-09 DIAGNOSIS — M25621 Stiffness of right elbow, not elsewhere classified: Secondary | ICD-10-CM

## 2024-11-09 DIAGNOSIS — R29898 Other symptoms and signs involving the musculoskeletal system: Secondary | ICD-10-CM

## 2024-11-09 DIAGNOSIS — M25641 Stiffness of right hand, not elsewhere classified: Secondary | ICD-10-CM

## 2024-11-09 MED ORDER — CARBIDOPA-LEVODOPA 25-100 MG PO TABS: 1.0000 | ORAL_TABLET | Freq: Three times a day (TID) | ORAL | 3 refills | Status: AC

## 2024-11-09 NOTE — Progress Notes (Signed)
 Follow-up Visit   Date: 11/09/2024    Nathaniel [name] MRN: [id number] DOB: [birth year]    Nathaniel [name] is a 73 y.o. right-handed Caucasian male with RA, hypertension, GERD, and OSA returning to the clinic for follow-up of right side weakness.  The patient was accompanied to the clinic by wife who also provides collateral information.    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  Confirmed by DAT scan.  He reports having increased dry mouth over the past year and was inquiring whether it could be medication side effect.  Dry mouth is not a common side effect and reported in about 1% of patients.   - Reduce sinemet  25/100 to 1 tablet three times daily to see if dry mouth improves  - Continue PT and OT  - Recommend that he talk to PCP or rheumatology for management options for dry mouth  2.   White matter abnormality on MRI brain, stable on MRI from 11/06/2024.  Imaging shows stable nonenhancing confluent white matter abnormality over the right posterior parietal region.  He is asymptomatic.  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  CSF testing was normal. He has been off methotrexate.  Given that his surveillance imaging has been stable for over 1 year, I will plan to recheck in 1 year.  Return to clinic in 6 months  --------------------------------------------- History of present illness: Starting in 2021, he began noticing weakness involving the right arm and leg.  He also feels that the right hand does not have dexterity, so has started to use his left hand.  He endorses stiffness in the hands. He struggles with fine motor tasks such as tying shoes or writing with the right hand.  He walks with a limp.  He has not had any falls and walks unassisted.  He denies changes in smell or constipation.     He also complains of localized pain over the right upper arm.  Pain is achy and  can sometimes radiate down the arm. 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  He saw Dr. Burnetta who offered ESI and PT.   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   He lives at home with wife.     UPDATE 07/23/2023:  He is here to discuss the results of DAT scan and MRI brain.  DAT scan was consistent with parkinson's disease.  MRI brain showed white matter change over the right parietal region, which is nonenhancing.  He denies any left sided weakness or sensory changes.  No problems with vision or language. His right arm continues to be stiff and he struggles with fine motor tasks.  His gait is stiff and unchanged.   UPDATE 10/15/2023:  He is here for follow-up visit.  He has been compliant with taking sinemet  three times daily, but wife has noticed that he becomes easily agitated and irritable on the medication.  He has not noticed a marked change in his right arm stiffness.  He feels that PT is helping with staying active and his balance.  He had MRI brain which is stable.  No new complaints.   UPDATE 05/04/2024:  He is here for follow-up visit.  His agitation and mood has been doing better.  He is taking sinemet  25/100 1.5 tablets three times daily.  He completed OT and felt that  it helped his fine motor tasks.  He reports having a dull headache with sinemet  and takes tylenol  as needed. No new neurological complaints.   UPDATE 11/09/2024:  He is here for follow-up visit.  Discussed the use of AI scribe software for clinical note transcription with the patient, who gave verbal consent to proceed.  History of Present Illness Nathaniel [name] is a 73 year old male with Parkinson's disease and rheumatoid arthritis who presents for a six-month follow-up visit.  He experiences daily dry mouth, which he thinks may be due to his medication.  He takes sinemet  1.5 tablet three times daily and feels  that this along with occupational therapy has helped his right hand movement and range of motion.  The dry mouth is particularly bothersome at night, requiring the use of a spray and a prescribed mouthwash to prevent bacterial growth. Despite these efforts, he developed three cavities between dental visits in November of last year and May of this year. He reports some dry lips, but not as much as the dry mouth.  He is also taking Orencia once a week for rheumatoid arthritis and reports no flare-ups since switching from [name].    Medications:  Current Outpatient Medications on File Prior to Visit  Medication Sig Dispense Refill   Abatacept (ORENCIA CLICKJECT) 125 MG/ML SOAJ Inject 125 mg/mL into the skin once a week.     acetaminophen  (TYLENOL ) 500 MG tablet Take 1,000 mg by mouth every 6 (six) hours as needed for moderate pain (pain score 4-6).     Ascorbic Acid (VITAMIN C) 100 MG tablet Take 100 mg by mouth daily.     cholecalciferol  (VITAMIN D ) 1000 UNITS tablet Take 1,000 Units by mouth daily after breakfast.      Coenzyme Q10 (CO Q-10) 100 MG CAPS Take 100 mg by mouth daily.     cyclobenzaprine  (FLEXERIL ) 5 MG tablet Take 1 tablet (5 mg total) by mouth at bedtime as needed for muscle spasms. 30 tablet 1   folic acid  (FOLVITE ) 1 MG tablet Take 1 mg by mouth daily after breakfast.      losartan  (COZAAR ) 100 MG tablet Take 50 mg by mouth daily after breakfast. Half a tablet daily     meloxicam (MOBIC) 15 MG tablet Take 15 mg by mouth daily as needed for pain (for pain). PRN for 30 days     vitamin E  400 UNIT capsule Take 400 Units by mouth daily after breakfast.     diazepam  (VALIUM ) 5 MG tablet Take 30-min prior to MRI. (Patient not taking: Reported on 11/09/2024) 1 tablet 0   diclofenac  Sodium (VOLTAREN ) 1 % GEL Apply 2 g topically 3 (three) times daily as needed (arthritis). (Patient not taking: Reported on 11/09/2024)     METHOTREXATE PO Take 2.5 mg by mouth. 4 tablets by  mouth one time weekly (Patient not taking: Reported on 08/06/2023)     traMADol  (ULTRAM ) 50 MG tablet Take 1-2 tablets (50-100 mg total) by mouth every 6 (six) hours as needed for moderate pain or severe pain. (Patient not taking: Reported on 08/06/2023) 20 tablet 0   No current facility-administered medications on file prior to visit.    Allergies: No Known Allergies  Vital Signs:  BP 130/75   Pulse 68   Ht 5' 7.5 (1.715 m)   Wt 201 lb (91.2 kg)   SpO2 97%   BMI 31.02 kg/m   Neurological Exam: MENTAL STATUS including orientation to time, place, person, recent  and remote memory, attention span and concentration, language, and fund of knowledge is normal.  Speech is not dysarthric. Blunted affect. Reduced blink.   CRANIAL NERVES:  Pupils equal round and reactive to light.  Normal conjugate, extra-ocular eye movements in all directions of gaze.  No ptosis.  Face is symmetric.   MOTOR:  Cogwheel rigidity of the right upper and lower extremity.  Tone in the left arm is normal.  Motor strength is 5/5 throughout.  MSRs:  Reflexes are 2+/4 throughout.  SENSORY:  Intact to vibration throughout.  COORDINATION/GAIT:  Normal finger-to- nose-finger.  Slowed finger tapping on the right. Left finger tapping intact.  Toe and heel tapping is slowed on the right compared to the left. Gait appears slightly unsteady, unassisted, mild rigidity in the right leg causing mild circumduction, poor arm swing on the left  Data: DAT scan 07/09/2023: Reduced bilateral putamen striatal Ioflupane activity as above. This pattern can be seen in Parkinsonian syndromes.  MRI brain wo contrast 06/30/2023: Abnormal T2 hyperintensity predominantly involving right parieto-occipital white matter, indeterminate. Considerations include infection (progressive multifocal leukoencephalopathy, encephalitis), neoplasm/glioma, demyelinating disease, and other inflammatory processes. Postcontrast brain MRI is recommended for further  evaluation.   MRI brain with contrast 07/13/2023: No enhancement of the stable confluent abnormal signal in the right parietal lobe, and no associated regional mass effect. No abnormal enhancement elsewhere.   This remains indeterminate, and although chronic nonspecific gliosis is possible a Low Grade Glioma is not excluded.  MRI Brain 09/30/2023: Unchanged area of confluent subcortical T2 hyperintense signal centered within the right parietal lobe and right occipital periventricular white matter. Given lack of cortical involvement, mass effect, and/or volume loss, this is favored to reflect sequela of prior inflammation or demyelination. Low-grade neoplasm remains a differential consideration.  MRI brain wwo contrast 11/06/2024: 1. Unchanged hyperintense T2-weighted signal within the posterior right parietal white matter without associated contrast enhancement. Differential considerations again include sequelae of inflammation/demyelination or low grade glioma.   CSF 08/07/2023:  R1 W1 P52 G59, JCV negative.    Thank you for allowing me to participate in patient's care.  If I can answer any additional questions, I would be pleased to do so.    Sincerely,    Lowen Barringer K. Tobie, DO

## 2024-11-09 NOTE — Patient Instructions (Signed)
 Reduce sinemet  to 1 tablet three times daily to see if this helps with dry mouth  Follow-up with Dr. Mai for his opinion on dry mouth  I will see you back in 6 months
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
## 2024-11-15 ENCOUNTER — Ambulatory Visit: Payer: Self-pay | Admitting: Occupational Therapy

## 2024-11-15 DIAGNOSIS — M25621 Stiffness of right elbow, not elsewhere classified: Secondary | ICD-10-CM

## 2024-11-15 DIAGNOSIS — M6281 Muscle weakness (generalized): Secondary | ICD-10-CM

## 2024-11-15 DIAGNOSIS — R278 Other lack of coordination: Secondary | ICD-10-CM

## 2024-11-15 DIAGNOSIS — R29898 Other symptoms and signs involving the musculoskeletal system: Secondary | ICD-10-CM

## 2024-11-15 DIAGNOSIS — M25641 Stiffness of right hand, not elsewhere classified: Secondary | ICD-10-CM

## 2024-11-15 DIAGNOSIS — R2689 Other abnormalities of gait and mobility: Secondary | ICD-10-CM

## 2024-11-15 DIAGNOSIS — R29818 Other symptoms and signs involving the nervous system: Secondary | ICD-10-CM | POA: Diagnosis not present
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
## 2024-12-01 ENCOUNTER — Ambulatory Visit: Attending: Registered Nurse | Admitting: Occupational Therapy

## 2024-12-01 DIAGNOSIS — R2689 Other abnormalities of gait and mobility: Secondary | ICD-10-CM | POA: Diagnosis present

## 2024-12-01 DIAGNOSIS — M62838 Other muscle spasm: Secondary | ICD-10-CM | POA: Insufficient documentation

## 2024-12-01 DIAGNOSIS — R29818 Other symptoms and signs involving the nervous system: Secondary | ICD-10-CM | POA: Insufficient documentation

## 2024-12-01 DIAGNOSIS — M25621 Stiffness of right elbow, not elsewhere classified: Secondary | ICD-10-CM | POA: Diagnosis present

## 2024-12-01 DIAGNOSIS — M25641 Stiffness of right hand, not elsewhere classified: Secondary | ICD-10-CM | POA: Diagnosis present

## 2024-12-01 DIAGNOSIS — R278 Other lack of coordination: Secondary | ICD-10-CM | POA: Insufficient documentation

## 2024-12-01 DIAGNOSIS — R29898 Other symptoms and signs involving the musculoskeletal system: Secondary | ICD-10-CM | POA: Insufficient documentation

## 2024-12-01 DIAGNOSIS — M6281 Muscle weakness (generalized): Secondary | ICD-10-CM | POA: Insufficient documentation
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
## 2024-12-07 ENCOUNTER — Ambulatory Visit: Admitting: Occupational Therapy

## 2024-12-07 DIAGNOSIS — M25641 Stiffness of right hand, not elsewhere classified: Secondary | ICD-10-CM

## 2024-12-07 DIAGNOSIS — M25621 Stiffness of right elbow, not elsewhere classified: Secondary | ICD-10-CM

## 2024-12-07 DIAGNOSIS — R29818 Other symptoms and signs involving the nervous system: Secondary | ICD-10-CM

## 2024-12-07 DIAGNOSIS — R278 Other lack of coordination: Secondary | ICD-10-CM

## 2024-12-07 DIAGNOSIS — M6281 Muscle weakness (generalized): Secondary | ICD-10-CM

## 2024-12-07 DIAGNOSIS — R29898 Other symptoms and signs involving the musculoskeletal system: Secondary | ICD-10-CM
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
## 2024-12-14 ENCOUNTER — Ambulatory Visit: Admitting: Occupational Therapy

## 2024-12-14 DIAGNOSIS — R29898 Other symptoms and signs involving the musculoskeletal system: Secondary | ICD-10-CM | POA: Diagnosis not present

## 2024-12-14 DIAGNOSIS — M25641 Stiffness of right hand, not elsewhere classified: Secondary | ICD-10-CM

## 2024-12-14 DIAGNOSIS — M25621 Stiffness of right elbow, not elsewhere classified: Secondary | ICD-10-CM

## 2024-12-14 DIAGNOSIS — R2689 Other abnormalities of gait and mobility: Secondary | ICD-10-CM

## 2024-12-14 DIAGNOSIS — M62838 Other muscle spasm: Secondary | ICD-10-CM

## 2024-12-14 DIAGNOSIS — M6281 Muscle weakness (generalized): Secondary | ICD-10-CM

## 2024-12-14 DIAGNOSIS — R278 Other lack of coordination: Secondary | ICD-10-CM
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
## 2024-12-21 ENCOUNTER — Ambulatory Visit: Admitting: Occupational Therapy
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
## 2024-12-22 ENCOUNTER — Ambulatory Visit: Admitting: Physical Therapy

## 2024-12-28 ENCOUNTER — Ambulatory Visit: Admitting: Physical Therapy

## 2024-12-29 ENCOUNTER — Ambulatory Visit: Attending: Registered Nurse | Admitting: Occupational Therapy

## 2024-12-29 DIAGNOSIS — M6281 Muscle weakness (generalized): Secondary | ICD-10-CM

## 2024-12-29 DIAGNOSIS — R29898 Other symptoms and signs involving the musculoskeletal system: Secondary | ICD-10-CM

## 2024-12-29 DIAGNOSIS — R29818 Other symptoms and signs involving the nervous system: Secondary | ICD-10-CM

## 2024-12-29 DIAGNOSIS — M25621 Stiffness of right elbow, not elsewhere classified: Secondary | ICD-10-CM

## 2024-12-29 DIAGNOSIS — M62838 Other muscle spasm: Secondary | ICD-10-CM

## 2024-12-29 DIAGNOSIS — R278 Other lack of coordination: Secondary | ICD-10-CM

## 2024-12-29 DIAGNOSIS — R2689 Other abnormalities of gait and mobility: Secondary | ICD-10-CM

## 2024-12-29 DIAGNOSIS — M25641 Stiffness of right hand, not elsewhere classified: Secondary | ICD-10-CM
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
## 2024-12-30 ENCOUNTER — Encounter: Payer: Self-pay | Admitting: Occupational Therapy

## 2024-12-30 NOTE — Addendum Note (Signed)
 Addended by: Itzael Liptak B on: 12/30/2024 10:59 AM   Modules accepted: Orders

## 2025-01-04 ENCOUNTER — Ambulatory Visit: Admitting: Occupational Therapy

## 2025-05-10 ENCOUNTER — Ambulatory Visit: Payer: Self-pay | Admitting: Neurology
